# Patient Record
Sex: Female | Born: 1960 | Race: Black or African American | Hispanic: No | Smoking: Former smoker
Health system: Southern US, Community
[De-identification: ages and names within clinical notes are randomized; demographics above are authoritative.]

## PROBLEM LIST (undated history)

## (undated) DIAGNOSIS — F32A Depression, unspecified: Secondary | ICD-10-CM

## (undated) DIAGNOSIS — I639 Cerebral infarction, unspecified: Secondary | ICD-10-CM

## (undated) DIAGNOSIS — R1310 Dysphagia, unspecified: Secondary | ICD-10-CM

## (undated) DIAGNOSIS — L89154 Pressure ulcer of sacral region, stage 4: Secondary | ICD-10-CM

## (undated) DIAGNOSIS — E785 Hyperlipidemia, unspecified: Secondary | ICD-10-CM

## (undated) DIAGNOSIS — F329 Major depressive disorder, single episode, unspecified: Secondary | ICD-10-CM

## (undated) DIAGNOSIS — I69991 Dysphagia following unspecified cerebrovascular disease: Secondary | ICD-10-CM

## (undated) DIAGNOSIS — I10 Essential (primary) hypertension: Secondary | ICD-10-CM

## (undated) DIAGNOSIS — R569 Unspecified convulsions: Secondary | ICD-10-CM

## (undated) HISTORY — DX: Pressure ulcer of sacral region, stage 4: L89.154

## (undated) HISTORY — DX: Cerebral infarction, unspecified: I63.9

## (undated) HISTORY — DX: Dysphagia following unspecified cerebrovascular disease: I69.991

## (undated) HISTORY — DX: Hyperlipidemia, unspecified: E78.5

## (undated) HISTORY — DX: Dysphagia, unspecified: R13.10

## (undated) HISTORY — PX: PEG TUBE PLACEMENT: SUR1034

## (undated) HISTORY — DX: Essential (primary) hypertension: I10

## (undated) HISTORY — PX: COLOSTOMY: SHX63

## (undated) HISTORY — DX: Unspecified convulsions: R56.9

---

## 2016-04-02 HISTORY — PX: ABOVE KNEE LEG AMPUTATION: SUR20

## 2016-04-08 LAB — BASIC METABOLIC PANEL
BUN: 11 mg/dL (ref 4–21)
CREATININE: 0.2 mg/dL — AB (ref 0.5–1.1)
GLUCOSE: 122 mg/dL
POTASSIUM: 4.1 mmol/L (ref 3.4–5.3)
SODIUM: 128 mmol/L — AB (ref 137–147)

## 2016-04-08 LAB — CBC AND DIFFERENTIAL
HCT: 27 % — AB (ref 36–46)
Hemoglobin: 8.9 g/dL — AB (ref 12.0–16.0)
Platelets: 330 10*3/uL (ref 150–399)
WBC: 9.7 10*3/mL

## 2016-04-08 LAB — HEPATIC FUNCTION PANEL
ALT: 15 U/L (ref 7–35)
AST: 14 U/L (ref 13–35)
Alkaline Phosphatase: 65 U/L (ref 25–125)
Bilirubin, Total: 0.3 mg/dL

## 2016-04-08 LAB — POCT INR: INR: 1.3 — AB (ref 0.9–1.1)

## 2016-04-11 ENCOUNTER — Encounter: Payer: Self-pay | Admitting: Adult Health

## 2016-04-11 ENCOUNTER — Non-Acute Institutional Stay (SKILLED_NURSING_FACILITY): Payer: Medicaid Other | Admitting: Adult Health

## 2016-04-11 DIAGNOSIS — Z720 Tobacco use: Secondary | ICD-10-CM | POA: Diagnosis not present

## 2016-04-11 DIAGNOSIS — I69354 Hemiplegia and hemiparesis following cerebral infarction affecting left non-dominant side: Secondary | ICD-10-CM | POA: Insufficient documentation

## 2016-04-11 DIAGNOSIS — L89159 Pressure ulcer of sacral region, unspecified stage: Secondary | ICD-10-CM | POA: Insufficient documentation

## 2016-04-11 DIAGNOSIS — Z933 Colostomy status: Secondary | ICD-10-CM

## 2016-04-11 DIAGNOSIS — I638 Other cerebral infarction: Secondary | ICD-10-CM | POA: Diagnosis not present

## 2016-04-11 DIAGNOSIS — I639 Cerebral infarction, unspecified: Secondary | ICD-10-CM | POA: Insufficient documentation

## 2016-04-11 DIAGNOSIS — I10 Essential (primary) hypertension: Secondary | ICD-10-CM | POA: Diagnosis not present

## 2016-04-11 DIAGNOSIS — F17200 Nicotine dependence, unspecified, uncomplicated: Secondary | ICD-10-CM | POA: Insufficient documentation

## 2016-04-11 DIAGNOSIS — F418 Other specified anxiety disorders: Secondary | ICD-10-CM | POA: Insufficient documentation

## 2016-04-11 DIAGNOSIS — Z89619 Acquired absence of unspecified leg above knee: Secondary | ICD-10-CM | POA: Insufficient documentation

## 2016-04-11 DIAGNOSIS — L89154 Pressure ulcer of sacral region, stage 4: Secondary | ICD-10-CM

## 2016-04-11 DIAGNOSIS — I69391 Dysphagia following cerebral infarction: Secondary | ICD-10-CM

## 2016-04-11 DIAGNOSIS — Z89612 Acquired absence of left leg above knee: Secondary | ICD-10-CM | POA: Diagnosis not present

## 2016-04-11 DIAGNOSIS — R627 Adult failure to thrive: Secondary | ICD-10-CM | POA: Diagnosis not present

## 2016-04-11 DIAGNOSIS — D638 Anemia in other chronic diseases classified elsewhere: Secondary | ICD-10-CM | POA: Diagnosis not present

## 2016-04-11 DIAGNOSIS — I6389 Other cerebral infarction: Secondary | ICD-10-CM

## 2016-04-11 NOTE — Progress Notes (Signed)
Patient ID: Katherine Palmer, female   DOB: 1961/03/05, 55 y.o.   MRN: [id number]    Location:   [facility] Room Number: 127-A Place of Service:  SNF (31)   CODE STATUS: Full Code  No Known Allergies  Chief Complaint  Patient presents with  . 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 She is peg tube dependent for her nutritional support. She is unable to participate in the hpi or ros. At this time there are no nursing concerns.    Past Medical History:  Diagnosis Date  . Decubitus ulcer of sacral region, stage 4 (HCC)   . Dysphagia   . Dysphagia as late effect of cerebrovascular disease   . Hyperlipidemia   . Hypertension   . Seizures (HCC)   . Stroke [facility])    Past Surgical History:  Procedure Laterality Date  . 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 COLOSTOMY    . PEG TUBE PLACEMENT     Social History   Social History  . Marital status: N/A    Spouse name: N/A  . Number of children: N/A  . Years of education: N/A   Occupational History  . Not on file.   Social History Main Topics  . Smoking status: Unknown If Ever Smoked  . Smokeless tobacco: Never Used  . Alcohol use Not on file  . Drug use: Unknown  . Sexual activity: No   Other Topics Concern  . Not on file   Social History Narrative  . No narrative on file     Family History  Problem Relation Age of Onset  . Family history unknown: Yes      VITAL SIGNS BP 118/68   Pulse 93   Temp 97.9 F (36.6 C) (Oral)   Resp 18   Ht 5\' 3"  (1.6 m)   Wt 123 lb 6 oz (56 kg)   SpO2 92%   BMI 21.85 kg/m   Patient's Medications  New Prescriptions   No medications on file  Previous Medications   ACETAMINOPHEN (TYLENOL) 325 MG TABLET    Place 650 mg into feeding tube every 4 (four) hours as needed.   ASPIRIN EC 81 MG TABLET    81 mg daily. Via G-Tube   BISACODYL (DULCOLAX) 10 MG SUPPOSITORY    Place 10 mg rectally daily as needed for moderate constipation.   BUPROPION (WELLBUTRIN) 75 MG TABLET    Place 150 mg into feeding tube 2 (two) times daily.   CLONIDINE (CATAPRES) 0.1 MG TABLET    Take 0.1 mg by mouth every 6 (six) hours as needed.   DIVALPROEX (DEPAKOTE) 125 MG DR TABLET    125 mg 2 (two) times daily.   FA-B6-B12-ARGININE-BLACKPEPPER PO    Give 1 tablet by tube daily.   IPRATROPIUM (ATROVENT) 0.02 % NEBULIZER SOLUTION    Take 2.5 mg by nebulization every 4 (four) hours as needed for wheezing or shortness of breath.   LEVOFLOXACIN (LEVAQUIN) 750 MG TABLET    750 mg daily. Via G-Tube   LINEZOLID (ZYVOX) 600 MG TABLET    Place 600 mg into feeding tube 2 (two) times daily.   LORAZEPAM (ATIVAN) 0.5 MG TABLET    Take 0.5 mg by mouth every 8 (eight) hours as needed  for anxiety.   MAGNESIUM HYDROXIDE (MILK OF MAGNESIA) 400 MG/5ML SUSPENSION    Take 30 mLs by mouth daily as needed for mild constipation.   METRONIDAZOLE (FLAGYL) 500 MG TABLET    Place 500 mg into feeding tube 3 (three) times daily.   NICOTINE (NICODERM CQ - DOSED IN MG/24 HR) 7 MG/24HR PATCH    Place 7 mg onto the skin daily.   ONDANSETRON (ZOFRAN) 4 MG TABLET    Place 4 mg into feeding tube every 6 (six) hours as needed for nausea or vomiting.   OXYCODONE HCL, ABUSE DETER, (OXAYDO) 5 MG TABA    Take 5 mg by mouth every 6 (six) hours as needed.   PANTOPRAZOLE (PROTONIX) 40 MG TABLET    40 mg daily.   POLYETHYLENE GLYCOL (MIRALAX / GLYCOLAX) PACKET    Place 17 g into feeding tube daily.   PRAVASTATIN (PRAVACHOL) 40 MG TABLET    Place 40 mg into feeding tube daily.   PRENATAL CA CARB-B6-B12-FA (FOLBECAL) 1 MG TABS    Give 1 tablet by tube daily.   SODIUM CHLORIDE FLUSH (NORMAL SALINE FLUSH) 0.9 % SOLN    Inject 10 mLs into the vein. Every shift   TRAZODONE (DESYREL) 50 MG TABLET    Place 50 mg into feeding tube at bedtime.    VALPROATE SODIUM (DEPAKENE) 250 MG/5ML SOLN SOLUTION    Take 2.5 mg by mouth 3 (three) times daily.  Modified Medications   No medications on file  Discontinued Medications   No medications on file     SIGNIFICANT DIAGNOSTIC EXAMS  03-28-16: chest x-ray: 1. Improvement in lung aeration since prior study with a decrease in pleural effusions. There is central vascular congestion but no convincing pulmonary edema.    LABS REVIEWED:   04-08-16: wbc 9.7; hgb 8.9; hct 26.5; plt 330; glucose 122; bun 11; creat 0.21; k+ 4.1; na++ 128; liver normal albumin 2.2    Review of Systems  Unable to perform ROS: Other    Physical Exam  Constitutional: No distress.  Frail   Eyes: Conjunctivae are normal.  Unable to make eye contact   Neck: Neck supple. No JVD present. No thyromegaly present.  Cardiovascular: Normal rate, regular rhythm and intact distal pulses.   Respiratory: Effort normal and breath sounds normal. No respiratory distress. She has no wheezes.  GI: Soft. Bowel sounds are normal. She exhibits no distension. There is no tenderness.  Has diverting colostomy   Genitourinary:  Genitourinary Comments: Has foley  Musculoskeletal: She exhibits no edema.  Is status post CVA Is able to move right upper extremity Unable to move left upper extremity: is stiff to passive range of motion Is status post left aka Right lower extremity is contracted   Lymphadenopathy:    She has no cervical adenopathy.  Neurological: She is alert.  Skin: Skin is warm and dry. She is not diaphoretic.  Stage IV sacral wound with wound vac in place 17 x 32 x 2 cm with 2 cm undermining at 2:00    ASSESSMENT/ PLAN:  1. 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   2. 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   3. Stage IV sacral ulceration: has wound vac in place; will be seen by wound  doctor; will continue current treatment; will continue zyvox and flagyl through 04-17-16; will begin prostat 30 cc three times  daily   4. Failure to thrive: her current weight is 123 pounds; will continue tube feeding for her nutritional support will begin prostat 30 cc three time daily albumin is 2.2; will check pre-albumin  5. Depression with anxiety: will continue wellbutrin 150 mg twice daily is taking depakote sprinkles 125 mg twice daily for mood stabilization;  will continue ativan 0.5 mg every 8 hours as needed for anxiety  6.  Hypertension: will continue clonidine 0.1 mg every 6 hours as needed for systolic b/p >170  7. Smoker: is on nicotine patch 7 mg daily for 14 days will then stop and will monitor  8. Cystitis; will complete levaquin 750 mg daily through 04-18-16; will monitor   9. Anemia: hgb 8.9 will monitor her status   10. 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

## 2016-04-16 ENCOUNTER — Non-Acute Institutional Stay (SKILLED_NURSING_FACILITY): Payer: Medicaid Other | Admitting: Internal Medicine

## 2016-04-16 ENCOUNTER — Encounter: Payer: Self-pay | Admitting: Internal Medicine

## 2016-04-16 DIAGNOSIS — I6930 Unspecified sequelae of cerebral infarction: Secondary | ICD-10-CM

## 2016-04-16 DIAGNOSIS — F418 Other specified anxiety disorders: Secondary | ICD-10-CM

## 2016-04-16 DIAGNOSIS — Z960 Presence of urogenital implants: Secondary | ICD-10-CM

## 2016-04-16 DIAGNOSIS — Z9289 Personal history of other medical treatment: Secondary | ICD-10-CM

## 2016-04-16 DIAGNOSIS — E43 Unspecified severe protein-calorie malnutrition: Secondary | ICD-10-CM

## 2016-04-16 DIAGNOSIS — Z933 Colostomy status: Secondary | ICD-10-CM

## 2016-04-16 DIAGNOSIS — I10 Essential (primary) hypertension: Secondary | ICD-10-CM

## 2016-04-16 DIAGNOSIS — Z931 Gastrostomy status: Secondary | ICD-10-CM

## 2016-04-16 DIAGNOSIS — L89154 Pressure ulcer of sacral region, stage 4: Secondary | ICD-10-CM

## 2016-04-16 DIAGNOSIS — D638 Anemia in other chronic diseases classified elsewhere: Secondary | ICD-10-CM

## 2016-04-16 DIAGNOSIS — R627 Adult failure to thrive: Secondary | ICD-10-CM

## 2016-04-16 DIAGNOSIS — Z89612 Acquired absence of left leg above knee: Secondary | ICD-10-CM

## 2016-04-16 DIAGNOSIS — Z978 Presence of other specified devices: Secondary | ICD-10-CM

## 2016-04-16 NOTE — Progress Notes (Signed)
Patient ID: Katherine Palmer, female   DOB: 1961-02-06, 55 y.o.   MRN: [id number]    HISTORY AND PHYSICAL   DATE:  04/16/16  [facility] Room Number: 127- A Place of Service: SNF (31)   Extended Emergency Contact Information Primary Emergency Contact: Brogden,Katawbba Address: [address]          [place], [place] [address] D[place] of [place] Phone: [phone] Relation: Daughter Secondary Emergency Contact: Delane Ginger Address: [address]          [place], [place] [address] Darden [place] of [place] Mobile Phone: [phone] Relation: Daughter  Advanced Directive information Does patient have an advance directive?: No, Would patient like information on creating an advanced directive?: No - patient declined information FULL CODE Chief Complaint  Patient presents with  . 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 She had multiple wounds including sacral wound tx with wound vac. She was recently tx for sepsis due to infected sacral wound. She also recently had left AKA due to LLE ischemia. She presents to SNF for short term rehab.  She c/o severe sacral pain today. She has oxycodone for pain. No nursing issues. No falls. She gets TF and pleasure foods. She is a poor historian due to expressive aphasia. Hx obtained from chart. She receives levaquin IV for cystitis and will complete 8/3rd  Hx CVA - has left hemiparesis.  Takes asa 81 mg daily. She has dysphagia and is Peg tube dependent but does get pleasure foods; no signs of aspiration present.   Stage IV sacral ulcer - wound vac in place; followed by wound doctor and facility wound care. she takes zyvox and flagyl through  04-17-16; she gets  prostat 30 cc three times daily   Failure to thrive -  current weight is 123 pounds; gets TF for nutritional support and  prostat 30 cc three time daily.  albumin is 2.2  Depression with anxiety - mood stable on wellbutrin 150 mg twice daily and  depakote sprinkles 125 mg twice daily; takes ativan 0.5 mg every 8 hours as needed for anxiety  Hypertension: - BP stable on clonidine 0.1 mg every 6 hours as needed for systolic b/p >170  Chronic tobacco abuse - is on nicotine patch 7 mg daily for 14 days and will then be off  Hx Anemia - Hgb 8.9 and stable    Past Medical History:  Diagnosis Date  . Decubitus ulcer of sacral region, stage 4 (HCC)   . Dysphagia   . Dysphagia as late effect of cerebrovascular disease   . Hyperlipidemia   . Hypertension   . Seizures (HCC)   . Stroke [facility])     Past Surgical History:  Procedure Laterality Date  . 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 COLOSTOMY    . PEG TUBE PLACEMENT      Patient Care Team: Kirt Boys, DO as PCP - General (Internal Medicine) Sharee Holster, NP as Nurse Practitioner (Geriatric Medicine) [facility] (Skilled Nursing Facility)  Social History   Social History  . Marital status: Divorced    Spouse name: N/A  . Number of children: N/A  .  Years of education: N/A   Occupational History  . Not on file.   Social History Main Topics  . Smoking status: Former Games developer  . Smokeless tobacco: Never Used  . Alcohol use Not on file  . Drug use: Unknown  . Sexual activity: No   Other Topics Concern  . Not on file   Social History Narrative  . No narrative on file     reports that she has quit smoking. She has never used smokeless tobacco. Her alcohol and drug histories are not on file.  Family History  Problem Relation Age of Onset  . Family history unknown: Yes   No family status information on file.     There is no immunization history on file for this patient.  No  Known Allergies  Medications: Patient's Medications  New Prescriptions   No medications on file  Previous Medications   ACETAMINOPHEN (TYLENOL) 325 MG TABLET    Place 650 mg into feeding tube every 4 (four) hours as needed.   ALBUTEROL (2.5 MG/3ML) 0.083% NEBU 3 ML, ALBUTEROL (5 MG/ML) 0.5% NEBU 0.5 ML    Inhale into the lungs every 4 (four) hours as needed (For wheezing and SOB).   ASPIRIN EC 81 MG TABLET    81 mg daily. Via G-Tube   BISACODYL (DULCOLAX) 10 MG SUPPOSITORY    Place 10 mg rectally daily as needed for moderate constipation.   BUPROPION (WELLBUTRIN) 75 MG TABLET    Place 150 mg into feeding tube 2 (two) times daily.   CLONIDINE (CATAPRES) 0.1 MG TABLET    Take 0.1 mg by mouth every 6 (six) hours as needed.   COLLAGENASE (SANTYL) OINTMENT    Apply 1 application topically daily. Apply to two wounds on left breast every day, apply to right breast wound every day, apply to back wound daily   DIVALPROEX (DEPAKOTE) 125 MG DR TABLET    125 mg 2 (two) times daily.   FA-B6-B12-ARGININE-BLACKPEPPER PO    Give 1 tablet by tube daily.   FEEDING SUPPLEMENT (BOOST HIGH PROTEIN) LIQD    Take 1 Container by mouth 3 (three) times daily between meals.   IPRATROPIUM (ATROVENT) 0.02 % NEBULIZER SOLUTION    Take 2.5 mg by nebulization every 4 (four) hours as needed for wheezing or shortness of breath.   LEVOFLOXACIN (LEVAQUIN) 750 MG TABLET    750 mg daily. Via G-Tube   LINEZOLID (ZYVOX) 600 MG TABLET    Place 600 mg into feeding tube 2 (two) times daily.   LORAZEPAM (ATIVAN) 0.5 MG TABLET    Take 0.5 mg by mouth every 8 (eight) hours as needed for anxiety.   MAGNESIUM HYDROXIDE (MILK OF MAGNESIA) 400 MG/5ML SUSPENSION    Take 30 mLs by mouth daily as needed for mild constipation.   METRONIDAZOLE (FLAGYL) 500 MG TABLET    Place 500 mg into feeding tube 3 (three) times daily.   NICOTINE (NICODERM CQ - DOSED IN MG/24 HR) 7 MG/24HR PATCH    Place 7 mg onto the skin daily.   ONDANSETRON (ZOFRAN) 4 MG  TABLET    Place 4 mg into feeding tube every 6 (six) hours as needed for nausea or vomiting.   OXYCODONE HCL, ABUSE DETER, (OXAYDO) 5 MG TABA    Take 5 mg by mouth every 6 (six) hours as needed.   PANTOPRAZOLE (PROTONIX) 40 MG TABLET    40 mg daily.   POLYETHYLENE GLYCOL (MIRALAX / GLYCOLAX) PACKET    Place 17 g  into feeding tube daily.   PRAVASTATIN (PRAVACHOL) 40 MG TABLET    Place 40 mg into feeding tube daily.   PRENATAL CA CARB-B6-B12-FA (FOLBECAL) 1 MG TABS    Give 1 tablet by tube daily.   SODIUM CHLORIDE FLUSH (NORMAL SALINE FLUSH) 0.9 % SOLN    Inject 10 mLs into the vein. Every shift   TRAZODONE (DESYREL) 50 MG TABLET    Place 50 mg into feeding tube at bedtime.   VALPROATE SODIUM (DEPAKENE) 250 MG/5ML SOLN SOLUTION    Take 2.5 mg by mouth 3 (three) times daily.  Modified Medications   No medications on file  Discontinued Medications   No medications on file    Review of Systems  Unable to perform ROS: Other    Vitals:   04/16/16 1143  BP: 118/68  Pulse: 93  Resp: 18  Temp: 97.9 F (36.6 C)  TempSrc: Oral  SpO2: 92%  Weight: 123 lb 9.6 oz (56.1 kg)  Height: 5\' 3"  (1.6 m)   Body mass index is 21.89 kg/m.  Physical Exam  Constitutional: She appears well-developed.  Frail appearing in NAD, lying in bed  HENT:  Mouth/Throat: Oropharynx is clear and moist. No oropharyngeal exudate.  Eyes: Pupils are equal, round, and reactive to light. No scleral icterus.  Neck: Neck supple. Carotid bruit is not present. No tracheal deviation present. No thyromegaly present.  Cardiovascular: Regular rhythm, normal heart sounds and intact distal pulses.  Tachycardia present.  Exam reveals no gallop and no friction rub.   No murmur heard. Left AKA; no RLE edema or calf TTP. Right arm PICC line intact with no redness or d/c at insertion site  Pulmonary/Chest: Effort normal and breath sounds normal. No stridor. No respiratory distress. She has no wheezes. She has no rales.  Abdominal:  Soft. Bowel sounds are normal. She exhibits no distension and no mass. There is no hepatomegaly. There is no tenderness. There is no rebound and no guarding.    Genitourinary:  Genitourinary Comments: Foley DTG clear yellow urine  Musculoskeletal: She exhibits edema.  Contracted (flexion) LUE at elbow and fingers; left AKA with staples intact and no d/c, swelling or redness.  Lymphadenopathy:    She has no cervical adenopathy.  Neurological: She is alert.  Left hemiparesis and hemiplegia  Skin: Skin is warm and dry. No rash noted.  Sacral decub stage IV per wound care  Psychiatric: She has a normal mood and affect. Her behavior is normal.     Labs reviewed: Nursing Home on 04/11/2016  Component Date Value Ref Range Status  . INR 04/08/2016 1.3* 0.9 - 1.1 Final  . Hemoglobin 04/08/2016 8.9* 12.0 - 16.0 g/dL Final  . HCT 45/40/9811 27* 36 - 46 % Final  . Platelets 04/08/2016 330  150 - 399 K/L Final  . WBC 04/08/2016 9.7  10^3/mL Final  . Glucose 04/08/2016 122  mg/dL Final  . BUN 91/47/8295 11  4 - 21 mg/dL Final  . Creatinine 62/13/0865 0.2* 0.5 - 1.1 mg/dL Final  . Potassium 78/46/9629 4.1  3.4 - 5.3 mmol/L Final  . Sodium 04/08/2016 128* 137 - 147 mmol/L Final  . Alkaline Phosphatase 04/08/2016 65  25 - 125 U/L Final  . ALT 04/08/2016 15  7 - 35 U/L Final  . AST 04/08/2016 14  13 - 35 U/L Final  . Bilirubin, Total 04/08/2016 0.3  mg/dL Final    No results found.   Assessment/Plan   ICD-9-CM ICD-10-CM   1. Stage  IV pressure ulcer of sacral region (HCC) 707.03 L89.154    707.24    2. Failure to thrive syndrome, adult 783.7 R62.7   3. Status post above knee amputation of left lower extremity (HCC) V49.76 Z89.612   4. Depression with anxiety 300.4 F41.8   5. History of CVA with residual deficit 438.9 I69.30   6. Essential hypertension, benign 401.1 I10   7. Anemia of chronic disease 285.29 D63.8   8. Colostomy present (HCC) V44.3 Z93.3   9. PEG (percutaneous  endoscopic gastrostomy) status (HCC) V44.1 Z93.1   10. Foley catheter in place V45.89 Z92.89    due to #1  11. Protein-calorie malnutrition, severe (HCC) 262 E43     Check cbc, cmp, A1c, depakote level, iron studies  Cont nutritional supplements as ordered  Cont TF as ordered  Peg tube/foley cath/colostomy/PICC line care as indicated  Wound care as ordered. Cont wound vac  F/u with specialists as indicated  PT/OT/ST as indicated  GOAL: short term rehab with potential for long term care. Communicated with pt and nursing.  Will follow  Jashawna Reever S. Ancil Linsey  [facility] [address] [place], [place] [address] [phone] Cell (Monday-Friday 8 AM - 5 PM) [phone] After 5 PM and follow prompts

## 2016-04-18 ENCOUNTER — Non-Acute Institutional Stay (SKILLED_NURSING_FACILITY): Payer: Medicaid Other | Admitting: Adult Health

## 2016-04-18 ENCOUNTER — Encounter: Payer: Self-pay | Admitting: Adult Health

## 2016-04-18 DIAGNOSIS — L89154 Pressure ulcer of sacral region, stage 4: Secondary | ICD-10-CM

## 2016-04-18 DIAGNOSIS — D72829 Elevated white blood cell count, unspecified: Secondary | ICD-10-CM | POA: Diagnosis not present

## 2016-04-18 NOTE — Progress Notes (Signed)
Patient ID: Stephanye Finnicum, female   DOB: 1960/11/02, 55 y.o.   MRN: [id number]   Location:   [facility] Room Number: 127-A Place of Service:  SNF (31)   CODE STATUS: Full Code  No Known Allergies  Chief Complaint  Patient presents with  . Acute Visit    HPI:  Her wbc is 15.0 with a left shift. She has a large sacral wound; which is being followed by the wound doctor. There are no reports of fever present. She is unable to participate in the hpi or ros.  I have spoken with Dr. Montez Morita regarding her status and treatment plan.   Past Medical History:  Diagnosis Date  . Decubitus ulcer of sacral region, stage 4 (HCC)   . Dysphagia   . Dysphagia as late effect of cerebrovascular disease   . Hyperlipidemia   . Hypertension   . Seizures (HCC)   . Stroke [facility])     Past Surgical History:  Procedure Laterality Date  . 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 COLOSTOMY    . PEG TUBE PLACEMENT      Social History   Social History  . Marital status: Divorced    Spouse name: N/A  . Number of children: N/A  . Years of education: N/A   Occupational History  . Not on file.   Social History Main Topics  . Smoking status: Former Games developer  . Smokeless tobacco: Never Used  . Alcohol use Not on file  . Drug use: Unknown  . Sexual activity: No   Other Topics Concern  . Not on file   Social History Narrative  . No narrative on file   Family History  Problem Relation Age of Onset  . Family history unknown: Yes      VITAL SIGNS BP 118/68   Pulse 93   Temp 97.9 F (36.6 C) (Oral)   Resp 18   Ht  (1.6 m)   Wt 123 lb (55.8 kg)   SpO2 92%   BMI 21.79 kg/m   Patient's Medications  New Prescriptions   No medications on file  Previous Medications   ACETAMINOPHEN (TYLENOL) 325 MG TABLET    Place 650 mg into feeding tube every 4 (four) hours as needed.   ALBUTEROL (2.5 MG/3ML) 0.083% NEBU 3 ML, ALBUTEROL (5 MG/ML) 0.5% NEBU 0.5 ML    Inhale into the  lungs every 4 (four) hours as needed (For wheezing and SOB).   ASPIRIN EC 81 MG TABLET    81 mg daily. Via G-Tube   BISACODYL (DULCOLAX) 10 MG SUPPOSITORY    Place 10 mg rectally daily as needed for moderate constipation.   BUPROPION (WELLBUTRIN) 75 MG TABLET    Place 150 mg into feeding tube 2 (two) times daily.   CLONIDINE (CATAPRES) 0.1 MG TABLET    Take 0.1 mg by mouth every 6 (six) hours as needed.   COLLAGENASE (SANTYL) OINTMENT    Apply 1 application topically daily. Apply to two wounds on left breast every day, apply to right breast wound every day, apply to back wound daily   DIVALPROEX (DEPAKOTE) 125 MG DR TABLET    125 mg 2 (two) times daily.   FA-B6-B12-ARGININE-BLACKPEPPER PO    Give 1 tablet by tube daily.   FEEDING SUPPLEMENT (BOOST HIGH PROTEIN) LIQD    Take 1 Container by mouth 3 (three) times daily between meals.   IPRATROPIUM (ATROVENT) 0.02 % NEBULIZER SOLUTION    Take 2.5  mg by nebulization every 4 (four) hours as needed for wheezing or shortness of breath.   LORAZEPAM (ATIVAN) 0.5 MG TABLET    Take 0.5 mg by mouth every 8 (eight) hours as needed for anxiety.   MAGNESIUM HYDROXIDE (MILK OF MAGNESIA) 400 MG/5ML SUSPENSION    Take 30 mLs by mouth daily as needed for mild constipation.   NICOTINE (NICODERM CQ - DOSED IN MG/24 HR) 7 MG/24HR PATCH    Place 7 mg onto the skin daily.   ONDANSETRON (ZOFRAN) 4 MG TABLET    Place 4 mg into feeding tube every 6 (six) hours as needed for nausea or vomiting.   OXYCODONE HCL, ABUSE DETER, (OXAYDO) 5 MG TABA    Take 5 mg by mouth every 6 (six) hours as needed.   PANTOPRAZOLE (PROTONIX) 40 MG TABLET    40 mg daily.   POLYETHYLENE GLYCOL (MIRALAX / GLYCOLAX) PACKET    Place 17 g into feeding tube daily.   PRAVASTATIN (PRAVACHOL) 40 MG TABLET    Place 40 mg into feeding tube daily.   PRENATAL CA CARB-B6-B12-FA (FOLBECAL) 1 MG TABS    Give 1 tablet by tube daily.   SODIUM CHLORIDE FLUSH (NORMAL SALINE FLUSH) 0.9 % SOLN    Inject 10 mLs into the  vein. Every shift   TRAZODONE (DESYREL) 50 MG TABLET    Place 50 mg into feeding tube at bedtime.   VALPROATE SODIUM (DEPAKENE) 250 MG/5ML SOLN SOLUTION    Take 2.5 mg by mouth 3 (three) times daily.  Modified Medications   No medications on file  Discontinued Medications   No medications on file      SIGNIFICANT DIAGNOSTIC EXAMS  03-28-16: chest x-ray: 1. Improvement in lung aeration since prior study with a decrease in pleural effusions. There is central vascular congestion but no convincing pulmonary edema.    LABS REVIEWED:   04-08-16: wbc 9.7; hgb 8.9; hct 26.5; plt 330; glucose 122; bun 11; creat 0.21; k+ 4.1; na++ 128; liver normal albumin 2.2  04-12-16: wbc 15.0; hgb 8.8; hct 26.6; mcv 94.0; plt 386; glucose 125; bun 11; creat 0.21; k+ 4.0; na++ 132; liver normal albumin 2.0; chol 77; ldl 35; trig 81; hdl 26; iron 10; tibc 23; ferritin 471; depakote 15.4; hgb a1c 5.7; abs neut: 8400; abs mono 3750; abs baso 0      Review of Systems  Unable to perform ROS: Other    Physical Exam  Constitutional: No distress.  Frail   Eyes: Conjunctivae are normal.  Unable to make eye contact   Neck: Neck supple. No JVD present. No thyromegaly present.  Cardiovascular: Normal rate, regular rhythm and intact distal pulses.   Respiratory: Effort normal and breath sounds normal. No respiratory distress. She has no wheezes.  GI: Soft. Bowel sounds are normal. She exhibits no distension. There is no tenderness.  Has diverting colostomy   Genitourinary:  Genitourinary Comments: Has foley  Musculoskeletal: She exhibits no edema.  Is status post CVA Is able to move right upper extremity Unable to move left upper extremity: is stiff to passive range of motion Is status post left aka Right lower extremity is contracted   Lymphadenopathy:    She has no cervical adenopathy.  Neurological: She is alert.  Skin: Skin is warm and dry. She is not diaphoretic. picc line in place   Stage IV sacral  wound with wound vac in place 36 x 15 x 1.5 cm with 2 cm undermining at 2:00  ASSESSMENT/ PLAN:  1. Stage IV sacral ulceration: has wound vac in place; will be seen by wound doctor; will continue current treatment; will continue zyvox and flagyl through 04-17-16; will begin prostat 30 cc three times daily   2. Leukocytosis: will remove and replace the PICC line and will culture tip. Will setup a 2-d echo to look for endocarditis. Will begin Vancomycin IV for 14 days with pharmacy to dose. Will setup I/D consult    Time spent with patient 45   minutes >50% time spent counseling; reviewing medical record; tests; labs; and developing future plan of care   MD is aware of resident's narcotic use and is in agreement with current plan of care. We will attempt to wean resident as apropriate   Synthia Innocent NP [facility]  Contact [phone] Monday through Friday 8am- 5pm  After hours call [phone]

## 2016-04-25 ENCOUNTER — Encounter: Payer: Self-pay | Admitting: Adult Health

## 2016-04-25 ENCOUNTER — Non-Acute Institutional Stay (SKILLED_NURSING_FACILITY): Payer: Medicaid Other | Admitting: Adult Health

## 2016-04-25 DIAGNOSIS — A419 Sepsis, unspecified organism: Secondary | ICD-10-CM | POA: Diagnosis not present

## 2016-04-25 DIAGNOSIS — L89154 Pressure ulcer of sacral region, stage 4: Secondary | ICD-10-CM

## 2016-04-25 DIAGNOSIS — D72829 Elevated white blood cell count, unspecified: Secondary | ICD-10-CM

## 2016-04-25 NOTE — Progress Notes (Signed)
Patient ID: Katherine Palmer, female   DOB: [birth year], 55 y.o.   MRN: [id number]   Location:   [facility] Room Number: 127-A Place of Service:  SNF (31)   CODE STATUS: Full Code  No Known Allergies  Chief Complaint  Patient presents with  . Acute Visit    Follow up Echo study    HPI:  She is on zyvox; levaquin and flagyl. Her current weight is 109 pounds with her weight in July of 123 pounds. I am not certain if this weight is accurate and will need daily weights for a short period of time to ensure that this weight is accurate. Her tube feeding was adjusted on 04-24-16. Her 2-d echo recommends for her to have a TEE. She is unable to participate in the hpi.    Past Medical History:  Diagnosis Date  . Decubitus ulcer of sacral region, stage 4 (HCC)   . Dysphagia   . Dysphagia as late effect of cerebrovascular disease   . Hyperlipidemia   . Hypertension   . Seizures (HCC)   . Stroke [facility])     Past Surgical History:  Procedure Laterality Date  . 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 COLOSTOMY    . PEG TUBE PLACEMENT      Social History   Social History  . Marital status: Divorced    Spouse name: N/A  . Number of children: N/A  . Years of education: N/A   Occupational History  . Not on file.   Social History Main Topics  . Smoking status: Former Games developer  . Smokeless tobacco: Never Used  . Alcohol use Not on file  . Drug use: Unknown  . Sexual activity: No   Other Topics Concern  . Not on file   Social History Narrative  . No narrative on file   Family History  Problem Relation Age of Onset  . Family history unknown: Yes      VITAL SIGNS BP 128/77   Pulse 88   Temp 98.8 F (37.1 C) (Oral)   Resp 16   Ht 5\' 3"  (1.6 m)   Wt 109 lb (49.4 kg)   SpO2 95%   BMI 19.31 kg/m   Patient's Medications  New Prescriptions   No medications on file  Previous Medications   ACETAMINOPHEN (TYLENOL) 325 MG TABLET    Place 650 mg into feeding  tube every 4 (four) hours as needed.   ALBUTEROL (2.5 MG/3ML) 0.083% NEBU 3 ML, ALBUTEROL (5 MG/ML) 0.5% NEBU 0.5 ML    Inhale into the lungs every 4 (four) hours as needed (For wheezing and SOB).   ASPIRIN EC 81 MG TABLET    81 mg daily. Via G-Tube   BISACODYL (DULCOLAX) 10 MG SUPPOSITORY    Place 10 mg rectally daily as needed for moderate constipation.   BUPROPION (WELLBUTRIN) 75 MG TABLET    Place 150 mg into feeding tube 2 (two) times daily.   CLONIDINE (CATAPRES) 0.1 MG TABLET    Take 0.1 mg by mouth every 6 (six) hours as needed.   COLLAGENASE (SANTYL) OINTMENT    Apply 1 application topically daily. Apply to two wounds on left breast every day, apply to right breast wound every day, apply to back wound daily   DIVALPROEX (DEPAKOTE) 125 MG DR TABLET    125 mg 2 (two) times daily.   FA-B6-B12-ARGININE-BLACKPEPPER PO    Give 1 tablet by tube daily.   FEEDING SUPPLEMENT (BOOST HIGH  PROTEIN) LIQD    Take 1 Container by mouth 3 (three) times daily between meals.   IPRATROPIUM (ATROVENT) 0.02 % NEBULIZER SOLUTION    Take 2.5 mg by nebulization every 4 (four) hours as needed for wheezing or shortness of breath.   LEVOFLOXACIN (LEVAQUIN) 750 MG TABLET    750 mg daily. Via G-Tube   LINEZOLID (ZYVOX) 600 MG TABLET    Place 600 mg into feeding tube 2 (two) times daily.   LORAZEPAM (ATIVAN) 0.5 MG TABLET    Take 0.5 mg by mouth every 8 (eight) hours as needed for anxiety.   MAGNESIUM HYDROXIDE (MILK OF MAGNESIA) 400 MG/5ML SUSPENSION    Take 30 mLs by mouth daily as needed for mild constipation.   METRONIDAZOLE (FLAGYL) 500 MG TABLET    Place 500 mg into feeding tube 3 (three) times daily.   NICOTINE (NICODERM CQ - DOSED IN MG/24 HR) 7 MG/24HR PATCH    Place 7 mg onto the skin daily.   ONDANSETRON (ZOFRAN) 4 MG TABLET    Place 4 mg into feeding tube every 6 (six) hours as needed for nausea or vomiting.   OXYCODONE HCL, ABUSE DETER, (OXAYDO) 5 MG TABA    Take 5 mg by mouth every 6 (six) hours as needed.     PANTOPRAZOLE (PROTONIX) 40 MG TABLET    40 mg daily.   POLYETHYLENE GLYCOL (MIRALAX / GLYCOLAX) PACKET    Place 17 g into feeding tube daily.   PRAVASTATIN (PRAVACHOL) 40 MG TABLET    Place 40 mg into feeding tube daily.   PRENATAL CA CARB-B6-B12-FA (FOLBECAL) 1 MG TABS    Give 1 tablet by tube daily.   SODIUM CHLORIDE FLUSH (NORMAL SALINE FLUSH) 0.9 % SOLN    Inject 10 mLs into the vein. Every shift   VALPROATE SODIUM (DEPAKENE) 250 MG/5ML SOLN SOLUTION    Take 2.5 mg by mouth 3 (three) times daily.  Modified Medications   No medications on file  Discontinued Medications   TRAZODONE (DESYREL) 50 MG TABLET    Place 50 mg into feeding tube at bedtime.     SIGNIFICANT DIAGNOSTIC EXAMS  03-28-16: chest x-ray: 1. Improvement in lung aeration since prior study with a decrease in pleural effusions. There is central vascular congestion but no convincing pulmonary edema.   04-23-16: 2-d echo: 1. technically difficult study with limited views 2. Normal appearing left ventricular systolic function. EF 55-60% 3. Mild sclerotic aortic valve no significant stenosis by velocity 4. Mild concentric left ventricular hypertrophy 5. Moderate mitral regurgitation 6. Mild tricuspid regurgitation 7. Highly mobile interatrial septum with a suggestion of a septal defect. However, the septum and the valvular structures are not well visualized to determine if there is a defect or evidence of vegetations. 8. Recommend transesophageal echocardiogram for better visualization of te left atrial septum in addition to better visualize the valvular structures given the patient's history   LABS REVIEWED:   04-08-16: wbc 9.7; hgb 8.9; hct 26.5; plt 330; glucose 122; bun 11; creat 0.21; k+ 4.1; na++ 128; liver normal albumin 2.2  04-12-16: wbc 15.0; hgb 8.8; hct 26.6; mcv 94.0; plt 386; glucose 125; bun 11; creat 0.21; k+ 4.0; na++ 132; liver normal albumin 2.0; chol 77; ldl 35; trig 81; hdl 26; iron 10; tibc 23; ferritin  471; depakote 15.4; hgb a1c 5.7; abs neut: 8400; abs mono 3750; abs baso 0      Review of Systems  Unable to perform ROS: Other    Physical  Exam  Constitutional: No distress.  Frail   Eyes: Conjunctivae are normal.  Unable to make eye contact   Neck: Neck supple. No JVD present. No thyromegaly present.  Cardiovascular: Normal rate, regular rhythm and intact distal pulses.   Respiratory: Effort normal and breath sounds normal. No respiratory distress. She has no wheezes.  GI: Soft. Bowel sounds are normal. She exhibits no distension. There is no tenderness.  Has diverting colostomy   Genitourinary:  Genitourinary Comments: Has foley  Musculoskeletal: She exhibits no edema.  Is status post CVA Is able to move right upper extremity Unable to move left upper extremity: is stiff to passive range of motion Is status post left aka Right lower extremity is contracted   Lymphadenopathy:    She has no cervical adenopathy.  Neurological: She is alert.  Skin: Skin is warm and dry. She is not diaphoretic. picc line in place   Stage IV sacral wound with wound vac in place 36 x 15 x 1.5 cm with 2 cm undermining at 2:00      ASSESSMENT/ PLAN:  1. Stage IV sacral ulceration: has wound vac in place; will be seen by wound doctor; will continue current treatment; will continue zyvox and flagyl through 04-17-16; will begin prostat 30 cc three times daily   2. Leukocytosis/sepsis: will continue her current abt; will setup a TEE; her I/D consult is pending.  I have spoken with Dr. Montez Morita regarding her status.    Time spent with patient  45  minutes >50% time spent counseling; reviewing medical record; tests; labs; and developing future plan of care  MD is aware of resident's narcotic use and is in agreement with current plan of care. We will attempt to wean resident as apropriate   Synthia Innocent NP [facility]  Contact [phone] Monday through Friday 8am- 5pm  After hours  call [phone]

## 2016-05-07 ENCOUNTER — Encounter: Payer: Self-pay | Admitting: Cardiology

## 2016-05-08 ENCOUNTER — Ambulatory Visit: Payer: Self-pay | Admitting: Cardiology

## 2016-05-08 NOTE — Progress Notes (Deleted)
Cardiology Office Note    Date:  05/08/2016   ID:  Katherine AhmadiDiane Lawes, DOB 08-07-1961, MRN [id number]  PCP:  Kirt Boysarter, Monica, DO  Cardiologist:  Peter SwazilandJordan, MD    History of Present Illness:  Katherine Palmer is a 55 y.o. female ***    Past Medical History:  Diagnosis Date  . Decubitus ulcer of sacral region, stage 4 (HCC)   . Dysphagia   . Dysphagia as late effect of cerebrovascular disease   . Hyperlipidemia   . Hypertension   . Seizures (HCC)   . Stroke [facility](HCC)     Past Surgical History:  Procedure Laterality Date  . 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 COLOSTOMY    . PEG TUBE PLACEMENT      Current Medications: Outpatient Medications Prior to Visit  Medication Sig Dispense Refill  . acetaminophen (TYLENOL) 325 MG tablet Place 650 mg into feeding tube every 4 (four) hours as needed.    Marland Kitchen. albuterol (2.5 MG/3ML) 0.083% NEBU 3 mL, albuterol (5 MG/ML) 0.5% NEBU 0.5 mL Inhale into the lungs every 4 (four) hours as needed (For wheezing and SOB).    Marland Kitchen. aspirin EC 81 MG tablet 81 mg daily. Via G-Tube    . bisacodyl (DULCOLAX) 10 MG suppository Place 10 mg rectally daily as needed for moderate constipation.    Marland Kitchen. buPROPion (WELLBUTRIN) 75 MG tablet Place 150 mg into feeding tube 2 (two) times daily.    . cloNIDine (CATAPRES) 0.1 MG tablet Take 0.1 mg by mouth every 6 (six) hours as needed.    . collagenase (SANTYL) ointment Apply 1 application topically daily. Apply to two wounds on left breast every day, apply to right breast wound every day, apply to back wound daily    . divalproex (DEPAKOTE) 125 MG DR tablet 125 mg 2 (two) times daily.    Marland Kitchen. FA-B6-B12-ARGININE-BLACKPEPPER PO Give 1 tablet by tube daily.    . feeding supplement (BOOST HIGH PROTEIN) LIQD Take 1 Container by mouth 3 (three) times daily between meals.    Marland Kitchen. ipratropium (ATROVENT) 0.02 % nebulizer solution Take 2.5 mg by nebulization every 4 (four) hours as needed for wheezing or shortness of breath.    .  levofloxacin (LEVAQUIN) 750 MG tablet 750 mg daily. Via G-Tube    . linezolid (ZYVOX) 600 MG tablet Place 600 mg into feeding tube 2 (two) times daily.    Marland Kitchen. LORazepam (ATIVAN) 0.5 MG tablet Take 0.5 mg by mouth every 8 (eight) hours as needed for anxiety.    . magnesium hydroxide (MILK OF MAGNESIA) 400 MG/5ML suspension Take 30 mLs by mouth daily as needed for mild constipation.    . metroNIDAZOLE (FLAGYL) 500 MG tablet Place 500 mg into feeding tube 3 (three) times daily.    . nicotine (NICODERM CQ - DOSED IN MG/24 HR) 7 mg/24hr patch Place 7 mg onto the skin daily.    . ondansetron (ZOFRAN) 4 MG tablet Place 4 mg into feeding tube every 6 (six) hours as needed for nausea or vomiting.    . OxyCODONE HCl, Abuse Deter, (OXAYDO) 5 MG TABA Take 5 mg by mouth every 6 (six) hours as needed.    . pantoprazole (PROTONIX) 40 MG tablet 40 mg daily.    . polyethylene glycol (MIRALAX / GLYCOLAX) packet Place 17 g into feeding tube daily.    . pravastatin (PRAVACHOL) 40 MG tablet Place 40 mg into feeding tube daily.    . Prenatal Ca Carb-B6-B12-FA (FOLBECAL) 1 MG  TABS Give 1 tablet by tube daily.    . Sodium Chloride Flush (NORMAL SALINE FLUSH) 0.9 % SOLN Inject 10 mLs into the vein. Every shift    . Valproate Sodium (DEPAKENE) 250 MG/5ML SOLN solution Take 2.5 mg by mouth 3 (three) times daily.     No facility-administered medications prior to visit.      Allergies:   Review of patient's allergies indicates no known allergies.   Social History   Social History  . Marital status: Divorced    Spouse name: N/A  . Number of children: N/A  . Years of education: N/A   Social History Main Topics  . Smoking status: Former Games developermoker  . Smokeless tobacco: Never Used  . Alcohol use Not on file  . Drug use: Unknown  . Sexual activity: No   Other Topics Concern  . Not on file   Social History Narrative  . No narrative on file     Family History:  The patient's ***Family history is unknown by patient.     ROS:   Please see the history of present illness.    ROS All other systems reviewed and are negative.   PHYSICAL EXAM:   VS:  There were no vitals taken for this visit.   GEN: Well nourished, well developed, in no acute distress HEENT: normal Neck: no JVD, carotid bruits, or masses Cardiac: ***RRR; no murmurs, rubs, or gallops,no edema  Respiratory:  clear to auscultation bilaterally, normal work of breathing GI: soft, nontender, nondistended, + BS MS: no deformity or atrophy Skin: warm and dry, no rash Neuro:  Alert and Oriented x 3, Strength and sensation are intact Psych: euthymic mood, full affect  Wt Readings from Last 3 Encounters:  04/25/16 109 lb (49.4 kg)  04/18/16 123 lb (55.8 kg)  04/16/16 123 lb 9.6 oz (56.1 kg)      Studies/Labs Reviewed:   EKG:  EKG is*** ordered today.  The ekg ordered today demonstrates ***  Recent Labs: 04/08/2016: ALT 15; BUN 11; Creatinine 0.2; Hemoglobin 8.9; Platelets 330; Potassium 4.1; Sodium 128   Lipid Panel No results found for: CHOL, TRIG, HDL, CHOLHDL, VLDL, LDLCALC, LDLDIRECT  Additional studies/ records that were reviewed today include:  ***    ASSESSMENT:    No diagnosis found.   PLAN:  In order of problems listed above:  1. ***    Medication Adjustments/Labs and Tests Ordered: Current medicines are reviewed at length with the patient today.  Concerns regarding medicines are outlined above.  Medication changes, Labs and Tests ordered today are listed in the Patient Instructions below. There are no Patient Instructions on file for this visit.   Signed, Peter SwazilandJordan, MD  05/08/2016 7:29 AM    [facility] [address], [place], [place], [id number] [phone]

## 2016-05-09 ENCOUNTER — Encounter: Payer: Self-pay | Admitting: *Deleted

## 2016-05-09 NOTE — Progress Notes (Signed)
Letter mailed

## 2016-05-13 DIAGNOSIS — D72829 Elevated white blood cell count, unspecified: Secondary | ICD-10-CM | POA: Insufficient documentation

## 2016-05-14 DIAGNOSIS — A419 Sepsis, unspecified organism: Secondary | ICD-10-CM | POA: Insufficient documentation

## 2016-05-16 ENCOUNTER — Encounter: Payer: Self-pay | Admitting: Adult Health

## 2016-05-16 ENCOUNTER — Emergency Department (HOSPITAL_COMMUNITY): Payer: Medicaid Other

## 2016-05-16 ENCOUNTER — Inpatient Hospital Stay (HOSPITAL_COMMUNITY): Payer: Medicaid Other

## 2016-05-16 ENCOUNTER — Non-Acute Institutional Stay (SKILLED_NURSING_FACILITY): Payer: Medicaid Other | Admitting: Adult Health

## 2016-05-16 ENCOUNTER — Encounter (HOSPITAL_COMMUNITY): Payer: Self-pay | Admitting: *Deleted

## 2016-05-16 ENCOUNTER — Inpatient Hospital Stay (HOSPITAL_COMMUNITY)
Admission: EM | Admit: 2016-05-16 | Discharge: 2016-05-21 | DRG: 871 | Disposition: A | Discharge status: Skilled Nursing Facility | Payer: Medicaid Other | Attending: Internal Medicine | Admitting: Internal Medicine

## 2016-05-16 DIAGNOSIS — A4181 Sepsis due to Enterococcus: Principal | ICD-10-CM | POA: Diagnosis present

## 2016-05-16 DIAGNOSIS — R0600 Dyspnea, unspecified: Secondary | ICD-10-CM | POA: Diagnosis not present

## 2016-05-16 DIAGNOSIS — Z89619 Acquired absence of unspecified leg above knee: Secondary | ICD-10-CM

## 2016-05-16 DIAGNOSIS — Z7189 Other specified counseling: Secondary | ICD-10-CM | POA: Diagnosis not present

## 2016-05-16 DIAGNOSIS — L89154 Pressure ulcer of sacral region, stage 4: Secondary | ICD-10-CM | POA: Diagnosis present

## 2016-05-16 DIAGNOSIS — L8994 Pressure ulcer of unspecified site, stage 4: Secondary | ICD-10-CM | POA: Diagnosis not present

## 2016-05-16 DIAGNOSIS — Z452 Encounter for adjustment and management of vascular access device: Secondary | ICD-10-CM

## 2016-05-16 DIAGNOSIS — I69391 Dysphagia following cerebral infarction: Secondary | ICD-10-CM

## 2016-05-16 DIAGNOSIS — I69354 Hemiplegia and hemiparesis following cerebral infarction affecting left non-dominant side: Secondary | ICD-10-CM

## 2016-05-16 DIAGNOSIS — I639 Cerebral infarction, unspecified: Secondary | ICD-10-CM | POA: Insufficient documentation

## 2016-05-16 DIAGNOSIS — E870 Hyperosmolality and hypernatremia: Secondary | ICD-10-CM | POA: Diagnosis present

## 2016-05-16 DIAGNOSIS — R1310 Dysphagia, unspecified: Secondary | ICD-10-CM | POA: Diagnosis present

## 2016-05-16 DIAGNOSIS — R0603 Acute respiratory distress: Secondary | ICD-10-CM

## 2016-05-16 DIAGNOSIS — D649 Anemia, unspecified: Secondary | ICD-10-CM | POA: Diagnosis present

## 2016-05-16 DIAGNOSIS — Z960 Presence of urogenital implants: Secondary | ICD-10-CM

## 2016-05-16 DIAGNOSIS — R54 Age-related physical debility: Secondary | ICD-10-CM | POA: Diagnosis present

## 2016-05-16 DIAGNOSIS — Z87891 Personal history of nicotine dependence: Secondary | ICD-10-CM | POA: Diagnosis not present

## 2016-05-16 DIAGNOSIS — Z931 Gastrostomy status: Secondary | ICD-10-CM

## 2016-05-16 DIAGNOSIS — Z7982 Long term (current) use of aspirin: Secondary | ICD-10-CM | POA: Diagnosis not present

## 2016-05-16 DIAGNOSIS — R4182 Altered mental status, unspecified: Secondary | ICD-10-CM | POA: Diagnosis present

## 2016-05-16 DIAGNOSIS — J9600 Acute respiratory failure, unspecified whether with hypoxia or hypercapnia: Secondary | ICD-10-CM

## 2016-05-16 DIAGNOSIS — L8990 Pressure ulcer of unspecified site, unspecified stage: Secondary | ICD-10-CM | POA: Insufficient documentation

## 2016-05-16 DIAGNOSIS — R571 Hypovolemic shock: Secondary | ICD-10-CM | POA: Insufficient documentation

## 2016-05-16 DIAGNOSIS — R6521 Severe sepsis with septic shock: Secondary | ICD-10-CM | POA: Diagnosis present

## 2016-05-16 DIAGNOSIS — A419 Sepsis, unspecified organism: Secondary | ICD-10-CM

## 2016-05-16 DIAGNOSIS — E872 Acidosis, unspecified: Secondary | ICD-10-CM | POA: Diagnosis present

## 2016-05-16 DIAGNOSIS — Z933 Colostomy status: Secondary | ICD-10-CM

## 2016-05-16 DIAGNOSIS — B952 Enterococcus as the cause of diseases classified elsewhere: Secondary | ICD-10-CM | POA: Diagnosis present

## 2016-05-16 DIAGNOSIS — Z95828 Presence of other vascular implants and grafts: Secondary | ICD-10-CM | POA: Diagnosis not present

## 2016-05-16 DIAGNOSIS — N390 Urinary tract infection, site not specified: Secondary | ICD-10-CM | POA: Diagnosis present

## 2016-05-16 DIAGNOSIS — Z681 Body mass index (BMI) 19 or less, adult: Secondary | ICD-10-CM

## 2016-05-16 DIAGNOSIS — E785 Hyperlipidemia, unspecified: Secondary | ICD-10-CM | POA: Diagnosis present

## 2016-05-16 DIAGNOSIS — R400 Somnolence: Secondary | ICD-10-CM | POA: Diagnosis not present

## 2016-05-16 DIAGNOSIS — Z993 Dependence on wheelchair: Secondary | ICD-10-CM

## 2016-05-16 DIAGNOSIS — R7881 Bacteremia: Secondary | ICD-10-CM | POA: Diagnosis not present

## 2016-05-16 DIAGNOSIS — R6520 Severe sepsis without septic shock: Secondary | ICD-10-CM

## 2016-05-16 DIAGNOSIS — G9340 Encephalopathy, unspecified: Secondary | ICD-10-CM | POA: Diagnosis present

## 2016-05-16 DIAGNOSIS — R569 Unspecified convulsions: Secondary | ICD-10-CM | POA: Diagnosis present

## 2016-05-16 DIAGNOSIS — E861 Hypovolemia: Secondary | ICD-10-CM | POA: Diagnosis present

## 2016-05-16 DIAGNOSIS — Z89612 Acquired absence of left leg above knee: Secondary | ICD-10-CM | POA: Diagnosis not present

## 2016-05-16 DIAGNOSIS — R29810 Facial weakness: Secondary | ICD-10-CM | POA: Diagnosis present

## 2016-05-16 DIAGNOSIS — I10 Essential (primary) hypertension: Secondary | ICD-10-CM | POA: Diagnosis present

## 2016-05-16 DIAGNOSIS — E43 Unspecified severe protein-calorie malnutrition: Secondary | ICD-10-CM | POA: Diagnosis present

## 2016-05-16 DIAGNOSIS — M2450 Contracture, unspecified joint: Secondary | ICD-10-CM | POA: Diagnosis present

## 2016-05-16 DIAGNOSIS — J9601 Acute respiratory failure with hypoxia: Secondary | ICD-10-CM | POA: Diagnosis not present

## 2016-05-16 DIAGNOSIS — L89892 Pressure ulcer of other site, stage 2: Secondary | ICD-10-CM | POA: Diagnosis present

## 2016-05-16 DIAGNOSIS — Z515 Encounter for palliative care: Secondary | ICD-10-CM

## 2016-05-16 DIAGNOSIS — E876 Hypokalemia: Secondary | ICD-10-CM | POA: Diagnosis present

## 2016-05-16 DIAGNOSIS — Z936 Other artificial openings of urinary tract status: Secondary | ICD-10-CM | POA: Diagnosis not present

## 2016-05-16 DIAGNOSIS — Z789 Other specified health status: Secondary | ICD-10-CM | POA: Diagnosis not present

## 2016-05-16 LAB — CBC WITH DIFFERENTIAL/PLATELET
BASOS ABS: 0 10*3/uL (ref 0.0–0.1)
BASOS PCT: 0 %
Eosinophils Absolute: 0 10*3/uL (ref 0.0–0.7)
Eosinophils Relative: 0 %
HEMATOCRIT: 37.8 % (ref 36.0–46.0)
HEMOGLOBIN: 10.7 g/dL — AB (ref 12.0–15.0)
Lymphocytes Relative: 14 %
Lymphs Abs: 2.2 10*3/uL (ref 0.7–4.0)
MCH: 28.2 pg (ref 26.0–34.0)
MCHC: 28.3 g/dL — ABNORMAL LOW (ref 30.0–36.0)
MCV: 99.7 fL (ref 78.0–100.0)
MONO ABS: 1.8 10*3/uL — AB (ref 0.1–1.0)
Monocytes Relative: 11 %
NEUTROS ABS: 12.1 10*3/uL — AB (ref 1.7–7.7)
NEUTROS PCT: 75 %
Platelets: 478 10*3/uL — ABNORMAL HIGH (ref 150–400)
RBC: 3.79 MIL/uL — ABNORMAL LOW (ref 3.87–5.11)
RDW: 19.2 % — AB (ref 11.5–15.5)
WBC: 16.1 10*3/uL — ABNORMAL HIGH (ref 4.0–10.5)

## 2016-05-16 LAB — POCT I-STAT 3, ART BLOOD GAS (G3+)
ACID-BASE DEFICIT: 6 mmol/L — AB (ref 0.0–2.0)
BICARBONATE: 18.4 mmol/L — AB (ref 20.0–28.0)
O2 Saturation: 96 %
TCO2: 19 mmol/L (ref 0–100)
pCO2 arterial: 30.8 mmHg — ABNORMAL LOW (ref 32.0–48.0)
pH, Arterial: 7.385 (ref 7.350–7.450)
pO2, Arterial: 84 mmHg (ref 83.0–108.0)

## 2016-05-16 LAB — LACTIC ACID, PLASMA
LACTIC ACID, VENOUS: 1 mmol/L (ref 0.5–1.9)
LACTIC ACID, VENOUS: 1.1 mmol/L (ref 0.5–1.9)

## 2016-05-16 LAB — PROCALCITONIN: Procalcitonin: 5.22 ng/mL

## 2016-05-16 LAB — C DIFFICILE QUICK SCREEN W PCR REFLEX
C DIFFICILE (CDIFF) INTERP: NOT DETECTED
C DIFFICLE (CDIFF) ANTIGEN: NEGATIVE
C Diff toxin: NEGATIVE

## 2016-05-16 LAB — I-STAT CG4 LACTIC ACID, ED: LACTIC ACID, VENOUS: 4.88 mmol/L — AB (ref 0.5–1.9)

## 2016-05-16 LAB — URINALYSIS, ROUTINE W REFLEX MICROSCOPIC
BILIRUBIN URINE: NEGATIVE
Glucose, UA: NEGATIVE mg/dL
Hgb urine dipstick: NEGATIVE
KETONES UR: NEGATIVE mg/dL
NITRITE: NEGATIVE
PROTEIN: NEGATIVE mg/dL
SPECIFIC GRAVITY, URINE: 1.034 — AB (ref 1.005–1.030)
pH: 5 (ref 5.0–8.0)

## 2016-05-16 LAB — URINE MICROSCOPIC-ADD ON
BACTERIA UA: NONE SEEN
RBC / HPF: NONE SEEN RBC/hpf (ref 0–5)

## 2016-05-16 LAB — VALPROIC ACID LEVEL: VALPROIC ACID LVL: 16 ug/mL — AB (ref 50.0–100.0)

## 2016-05-16 LAB — COMPREHENSIVE METABOLIC PANEL
ALBUMIN: 1.9 g/dL — AB (ref 3.5–5.0)
ALT: 31 U/L (ref 14–54)
ANION GAP: 11 (ref 5–15)
AST: 46 U/L — ABNORMAL HIGH (ref 15–41)
Alkaline Phosphatase: 80 U/L (ref 38–126)
BUN: 58 mg/dL — ABNORMAL HIGH (ref 6–20)
CHLORIDE: 121 mmol/L — AB (ref 101–111)
CO2: 19 mmol/L — AB (ref 22–32)
Calcium: 8.6 mg/dL — ABNORMAL LOW (ref 8.9–10.3)
Creatinine, Ser: 0.58 mg/dL (ref 0.44–1.00)
GFR calc Af Amer: >60 mL/min (ref >60)
GFR calc non Af Amer: >60 mL/min (ref >60)
GLUCOSE: 128 mg/dL — AB (ref 65–99)
POTASSIUM: 4.8 mmol/L (ref 3.5–5.1)
SODIUM: 151 mmol/L — AB (ref 135–145)
Total Bilirubin: 0.7 mg/dL (ref 0.3–1.2)
Total Protein: 8.2 g/dL — ABNORMAL HIGH (ref 6.5–8.1)

## 2016-05-16 LAB — GLUCOSE, CAPILLARY
Glucose-Capillary: 105 mg/dL — ABNORMAL HIGH (ref 65–99)
Glucose-Capillary: 76 mg/dL (ref 65–99)

## 2016-05-16 LAB — I-STAT ARTERIAL BLOOD GAS, ED
ACID-BASE DEFICIT: 3 mmol/L — AB (ref 0.0–2.0)
Bicarbonate: 19.9 mmol/L — ABNORMAL LOW (ref 20.0–28.0)
O2 Saturation: 98 %
PH ART: 7.445 (ref 7.350–7.450)
TCO2: 21 mmol/L (ref 0–100)
pCO2 arterial: 29 mmHg — ABNORMAL LOW (ref 32.0–48.0)
pO2, Arterial: 94 mmHg (ref 83.0–108.0)

## 2016-05-16 LAB — TROPONIN I: TROPONIN I: 0.05 ng/mL — AB (ref <0.03)

## 2016-05-16 LAB — I-STAT BETA HCG BLOOD, ED (MC, WL, AP ONLY): I-stat hCG, quantitative: <5 m[IU]/mL (ref <5)

## 2016-05-16 MED ORDER — ORAL CARE MOUTH RINSE
15.0000 mL | Freq: Two times a day (BID) | OROMUCOSAL | Status: DC
  Administered 2016-05-17 – 2016-05-21 (×9): 15 mL via OROMUCOSAL

## 2016-05-16 MED ORDER — VALPROATE SODIUM 250 MG/5ML PO SOLN
200.0000 mg | Freq: Three times a day (TID) | ORAL | Status: DC
  Administered 2016-05-16 – 2016-05-18 (×6): 200 mg
  Filled 2016-05-16: qty 4
  Filled 2016-05-16: qty 5
  Filled 2016-05-16: qty 4
  Filled 2016-05-16 (×4): qty 5

## 2016-05-16 MED ORDER — SODIUM CHLORIDE 0.9 % IV SOLN
INTRAVENOUS | Status: DC
  Administered 2016-05-16 – 2016-05-17 (×2): via INTRAVENOUS

## 2016-05-16 MED ORDER — HEPARIN SODIUM (PORCINE) 5000 UNIT/ML IJ SOLN
5000.0000 [IU] | Freq: Three times a day (TID) | INTRAMUSCULAR | Status: DC
  Administered 2016-05-16 – 2016-05-21 (×15): 5000 [IU] via SUBCUTANEOUS
  Filled 2016-05-16 (×15): qty 1

## 2016-05-16 MED ORDER — ACETAMINOPHEN 160 MG/5ML PO SOLN
1000.0000 mg | Freq: Once | ORAL | Status: AC
  Administered 2016-05-16: 1000 mg via ORAL
  Filled 2016-05-16: qty 40.6

## 2016-05-16 MED ORDER — NOREPINEPHRINE BITARTRATE 1 MG/ML IV SOLN
2.0000 ug/min | INTRAVENOUS | Status: DC
  Filled 2016-05-16: qty 4

## 2016-05-16 MED ORDER — ASPIRIN 81 MG PO CHEW
81.0000 mg | CHEWABLE_TABLET | Freq: Every day | ORAL | Status: DC
  Administered 2016-05-16 – 2016-05-21 (×6): 81 mg
  Filled 2016-05-16 (×6): qty 1

## 2016-05-16 MED ORDER — PIPERACILLIN-TAZOBACTAM 3.375 G IVPB
3.3750 g | Freq: Three times a day (TID) | INTRAVENOUS | Status: DC
  Administered 2016-05-16 – 2016-05-18 (×5): 3.375 g via INTRAVENOUS
  Filled 2016-05-16 (×7): qty 50

## 2016-05-16 MED ORDER — NOREPINEPHRINE BITARTRATE 1 MG/ML IV SOLN
0.0000 ug/min | Freq: Once | INTRAVENOUS | Status: AC
  Administered 2016-05-16: 2 ug/min via INTRAVENOUS
  Filled 2016-05-16: qty 4

## 2016-05-16 MED ORDER — SODIUM CHLORIDE 0.9 % IV BOLUS (SEPSIS)
500.0000 mL | Freq: Once | INTRAVENOUS | Status: AC
  Administered 2016-05-16: 500 mL via INTRAVENOUS

## 2016-05-16 MED ORDER — IPRATROPIUM-ALBUTEROL 0.5-2.5 (3) MG/3ML IN SOLN
3.0000 mL | Freq: Four times a day (QID) | RESPIRATORY_TRACT | Status: DC
  Administered 2016-05-16 – 2016-05-20 (×16): 3 mL via RESPIRATORY_TRACT
  Filled 2016-05-16 (×16): qty 3

## 2016-05-16 MED ORDER — FAMOTIDINE 40 MG/5ML PO SUSR
20.0000 mg | Freq: Every day | ORAL | Status: DC
  Administered 2016-05-16 – 2016-05-18 (×3): 20 mg
  Filled 2016-05-16 (×4): qty 2.5

## 2016-05-16 MED ORDER — CHLORHEXIDINE GLUCONATE 0.12 % MT SOLN
15.0000 mL | Freq: Two times a day (BID) | OROMUCOSAL | Status: DC
  Administered 2016-05-16 – 2016-05-21 (×10): 15 mL via OROMUCOSAL
  Filled 2016-05-16 (×10): qty 15

## 2016-05-16 MED ORDER — PIPERACILLIN-TAZOBACTAM 3.375 G IVPB 30 MIN
3.3750 g | Freq: Once | INTRAVENOUS | Status: AC
  Administered 2016-05-16: 3.375 g via INTRAVENOUS
  Filled 2016-05-16: qty 50

## 2016-05-16 MED ORDER — VANCOMYCIN HCL IN DEXTROSE 1-5 GM/200ML-% IV SOLN
1000.0000 mg | INTRAVENOUS | Status: DC
  Filled 2016-05-16: qty 200

## 2016-05-16 MED ORDER — SODIUM CHLORIDE 0.9 % IV BOLUS (SEPSIS)
2000.0000 mL | Freq: Once | INTRAVENOUS | Status: AC
  Administered 2016-05-16: 2000 mL via INTRAVENOUS

## 2016-05-16 MED ORDER — VANCOMYCIN HCL IN DEXTROSE 1-5 GM/200ML-% IV SOLN
1000.0000 mg | Freq: Once | INTRAVENOUS | Status: AC
  Administered 2016-05-16: 1000 mg via INTRAVENOUS
  Filled 2016-05-16: qty 200

## 2016-05-16 MED ORDER — SODIUM CHLORIDE 0.9 % IV BOLUS (SEPSIS)
1000.0000 mL | Freq: Once | INTRAVENOUS | Status: AC
  Administered 2016-05-16: 1000 mL via INTRAVENOUS

## 2016-05-16 MED ORDER — INSULIN ASPART 100 UNIT/ML ~~LOC~~ SOLN
0.0000 [IU] | SUBCUTANEOUS | Status: DC
  Administered 2016-05-19 – 2016-05-21 (×5): 1 [IU] via SUBCUTANEOUS

## 2016-05-16 MED ORDER — SODIUM CHLORIDE 0.9 % IV SOLN: 250.0000 mL | INTRAVENOUS | Status: DC | PRN

## 2016-05-16 NOTE — ED Notes (Signed)
Report attempted 

## 2016-05-16 NOTE — Progress Notes (Signed)
Pharmacy Antibiotic Note  Katherine Palmer is a 55 y.o. female admitted on 05/16/2016 with sepsis.  Pharmacy has been consulted for vancomycin and Zosyn dosing.   Plan: -vancomycin 1g IV q24h with first dose now. Goal trough 15-2020mcg/mL -Zosyn 3.375g IV q8h EI -follow c/s, clinical progression, renal function, trough at SS    Temp (24hrs), Avg:104.4 F (40.2 C), Min:103.1 F (39.5 C), Max:105.6 F (40.9 C)   Recent Labs Lab 05/16/16 1233 05/16/16 1254  WBC 16.1*  --   LATICACIDVEN  --  4.88*    CrCl cannot be calculated (Patient's most recent lab result is older than the maximum 21 days allowed.).    No Known Allergies  Antimicrobials this admission: Vanc 8/31 >>  Zosyn 8/31 >>   Dose adjustments this admission: n/a  Microbiology results: 8/31 BCx:  8/31 UCx:     Thank you for allowing pharmacy to be a part of this patient's care.  Bonner Larue D. Tove Wideman, PharmD, BCPS Clinical Pharmacist Pager: [phone] 05/16/2016 2:15 PM

## 2016-05-16 NOTE — Progress Notes (Signed)
Patiet arrived from ED. BP wnl, RA, mostly nonverbal,  But can pronounce yes, no ad some words with slurred speech, appropriately. Follows commands. Colostomy bag is leaking, bags are ordered, waiting on Wound and ostomy nurse. Open wounds on Lt hand, bilateral breasts, Rt leg, bottom cheek and a large ulcer that includes bottom and back. Also sal open wound on upper back. Lt top ear is missing. Skin has different tone of color I different places.   Elink is aware, pt is on Levo at 2, as well as LOC.

## 2016-05-16 NOTE — H&P (Signed)
PULMONARY / CRITICAL CARE MEDICINE   Name: Katherine Palmer MRN: [id number] DOB: [birth year]    ADMISSION DATE:  05/16/2016 CONSULTATION DATE:  05/16/16  REFERRING MD:  Patria Mane - EDP  CHIEF COMPLAINT:  Fever  HISTORY OF PRESENT ILLNESS:   Katherine Palmer is a 55 y.o. 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  She was brought to [facility] ED 8/31 due to fever of 105F and AMS.  She had apparently been in her usual state of health earlier in the week and just one week prior had been feeding herself and talking with family.  On day of ED presentation, she was encephalopathic and not really verbal.  In ED, old stool was noted around foley site (of note pt has colostomy). Sacral decub wound dressings noted to be saturated in ED.  New right facial droop also noted per family, ? New CVA vs fever related.  In ED, she was found to be febrile to 105F and hypotensive with SBP's in the 80's.  She was given 1L IVF without any improvement.  She had minimal UOP in old foley but after this was replaced, she had some UOP after roughly 45 minutes.  UA is pending.  CXR clear.  CVL was placed in ED and PCCM was called for admission.  Of note had recent admission to [facility] where she had diverting colostomy performed to allow sacral decub to heal. 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  PAST MEDICAL HISTORY :  She  has a past medical history of Decubitus ulcer of sacral region, stage 4 (HCC); Dysphagia; Dysphagia as late effect of cerebrovascular disease; Hyperlipidemia; Hypertension; Seizures (HCC); and Stroke (HCC).  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  No Known Allergies  No current facility-administered medications on file prior to encounter.    Current Outpatient Prescriptions on File Prior to Encounter    Medication Sig  . acetaminophen (TYLENOL) 325 MG tablet Place 650 mg into feeding tube every 4 (four) hours as needed for mild pain.   Marland Kitchen aspirin EC 81 MG tablet 81 mg daily. Via G-Tube  . bisacodyl (DULCOLAX) 10 MG suppository Place 10 mg rectally daily as needed for moderate constipation.  Marland Kitchen buPROPion (WELLBUTRIN) 75 MG tablet Place 150 mg into feeding tube 2 (two) times daily.  . cloNIDine (CATAPRES) 0.1 MG tablet Take 0.1 mg by mouth every 6 (six) hours as needed (blood pressure).   Marland Kitchen FA-B6-B12-ARGININE-BLACKPEPPER PO Give 1 tablet by tube daily.  . feeding supplement (BOOST HIGH PROTEIN) LIQD Take 1 Container by mouth 3 (three) times daily between meals.  Marland Kitchen ipratropium (ATROVENT) 0.02 % nebulizer solution Take 2.5 mg by nebulization every 4 (four) hours as needed for wheezing or shortness of breath.  Marland Kitchen LORazepam (ATIVAN) 0.5 MG tablet Take 0.5 mg by mouth every 8 (eight) hours as needed for anxiety.  . magnesium hydroxide (MILK OF MAGNESIA) 400 MG/5ML suspension Take 30 mLs by mouth daily as needed for mild constipation.  . Multiple Vitamins-Minerals (DECUBI-VITE) CAPS Take 1 capsule by mouth daily.  . OxyCODONE HCl, Abuse Deter, (OXAYDO) 5 MG TABA Take 5 mg by mouth every 6 (six) hours as needed (pain).   . polyethylene glycol (MIRALAX / GLYCOLAX) packet Place 17 g into feeding tube daily.  . pravastatin (PRAVACHOL) 40 MG tablet Place 40  mg into feeding tube daily.  . Prenatal Ca Carb-B6-B12-FA (FOLBECAL) 1 MG TABS Give 1 tablet by tube daily.  . Valproate Sodium (DEPAKENE PO) Give 200 mg by tube every 8 (eight) hours.    FAMILY HISTORY:  Her has no family status information on file.    SOCIAL HISTORY: She  reports that she has quit smoking. She has never used smokeless tobacco.  REVIEW OF SYSTEMS:  Unable to obtain as pt is encephalopathic.  SUBJECTIVE:   Remains encephalopathic.  VITAL SIGNS: BP (!) 75/60   Pulse (!) 134   Temp (!) 104.4 F (40.2 C) Comment: temp foley   Resp (!) 34   SpO2 99%   HEMODYNAMICS:    VENTILATOR SETTINGS:    INTAKE / OUTPUT: No intake/output data recorded.  PHYSICAL EXAMINATION: General:  Young female, frail, debilitated. Neuro:  L sided hemiparesis.  Opens eyes to names, tracks intermittently.  Not speaking. HEENT:  MM very dry, crusting to lips.  No JVD. Cardiovascular:  Tachy, regular, no M/R/G. Lungs:  Clear bilaterally, resps even and unlabored. Abdomen:  Soft, PEG C/D/I, right colostomy that is leaking malodorous liquid stool. Musculoskeletal:  L AKA (upper thigh). No edema. Skin:  Warm, dry.   See sacral wound photos below.         LABS:  BMET  Recent Labs Lab 05/16/16 1233  NA 151*  K 4.8  CL 121*  CO2 19*  BUN 58*  CREATININE 0.58  GLUCOSE 128*    Electrolytes  Recent Labs Lab 05/16/16 1233  CALCIUM 8.6*    CBC  Recent Labs Lab 05/16/16 1233  WBC 16.1*  HGB 10.7*  HCT 37.8  PLT 478*    Coag's No results for input(s): APTT, INR in the last 168 hours.  Sepsis Markers  Recent Labs Lab 05/16/16 1254  LATICACIDVEN 4.88*    ABG  Recent Labs Lab 05/16/16 1253  PHART 7.445  PCO2ART 29.0*  PO2ART 94.0    Liver Enzymes  Recent Labs Lab 05/16/16 1233  AST 46*  ALT 31  ALKPHOS 80  BILITOT 0.7  ALBUMIN 1.9*    Cardiac Enzymes No results for input(s): TROPONINI, PROBNP in the last 168 hours.  Glucose No results for input(s): GLUCAP in the last 168 hours.  Imaging Dg Chest Portable 1 View  Result Date: 05/16/2016 CLINICAL DATA:  55 year old presenting with acute mental status changes. Personal history of stroke in seizures. Current history of hypertension and sacral decubitus ulcer. EXAM: PORTABLE CHEST 1 VIEW COMPARISON:  None. FINDINGS: Cardiac silhouette upper normal in size for AP portable technique. Lungs clear. Pulmonary vascularity normal. No visible pleural effusions. Old healed right rib fractures. Harrington rod traverses the thoracic and upper  lumbar spine. IMPRESSION: No acute cardiopulmonary disease. Electronically Signed   By: Hulan Saashomas  Lawrence M.D.   On: 05/16/2016 13:17     STUDIES:  CXR 8/31 > negative.  CULTURES: Blood 8/31 > Urine 8/31 > Sputum 8/31 > Sacral decub 8/31 >  ANTIBIOTICS: Vanc 8/31 > Zosyn 8/31 >  SIGNIFICANT EVENTS: 8/31 > admitted with sepsis of unclear etiology, developing shock (favor septic as well as hypovolemic).  Likely sources include sacral decub, LLE wound, C.diff, PICC line infxn.  LINES/TUBES: L [place] CVL 8/31 >  DISCUSSION: 55 y.o. F from nursing home due to remote CVA, brought to ED with fever to 105F.  Sacral decub dressings found to be saturated in ED.  Found to have sepsis vs developing shock (favor septic as well as hypovolemic),  likely sources include sacral decub, LLE wound, C.diff, PICC line infxn.  ASSESSMENT / PLAN:  INFECTIOUS A: Shock - combo of septic + hypovolemic.  Sources include uti v wound v bacteremia/line infection (RUE midline) v CDiff v intra-abd source  Presumed UTI. Multiple wounds including large sacral decub.  P:   Broad spectrum abx as above.  Pan culture.  Trend pct, lactate.  Check CDiff -- long course abx over last few weeks for wound infection, watery stool from ostomy, high fever, leukocytosis.  CARDIOVASCULAR A:  Shock - favor septic as well as hypovolemic. Hx HTN. P:  Hold home anti-HTN.  Continue volume resuscitation - needs more fluid.  Pressors if needed to maintain MAP >65. CVP q4hrs. Assess cortisol. Trend lactate, troponin.  PULMONARY A: Tachypnea. Respiratory compensation for metabolic acidosis.  No obvious infiltrate on CXR.  P:   Supplemental O2 as needed to keep sats >92. Low threshold for intubation if mental status does not improve. F/u CXR.  Pulmonary hygiene.  F/u ABG.  RENAL A: Hypernatremia. Metabolic acidosis - nonAG. P:   Volume resuscitation as above.  F/u lactate.  F/u chem.   Replace old foley  catheter.  GASTROINTESTINAL A: Dysphagia - s/p PEG but eats per family.  Previous colostomy - diverting in setting large sacral decub.  Diarrhea -- extremely watery colostomy outpt, ? C.diff with recent abx exposure due to gangrenous LLE. P:   NPO for now.  Pepcid.   HEMATOLOGIC A: Leukocytosis.  Mild anemia.  P:  F/u CBC.  SQ heparin.   ENDOCRINE A: Hyperglycemia   - no known hx DM. P:   SSI.   NEUROLOGIC A: Hx severe CVA with residual left hemiparesis.  ? New right facial droop.  P:   CT head.  Consider neuro input - hold off for now.  Avoid sedation.  Poor functional status at baseline - was "improving" per family now able to feed herself intermittently, wheelchair bound.   FAMILY  - Updates: Family updated at bedside.  They would like to continue with FULL CODE status for now.  They would not want any long term heroic measures or to prolong non-reversible injury, etc.  - Inter-disciplinary family meet or Palliative Care meeting due by:  05/22/16.   CC time: 40 minutes.   Rutherford Guys, [place] - [facility] Pager: [phone]  or [phone] 05/16/2016, 4:08 PM

## 2016-05-16 NOTE — Progress Notes (Signed)
Called Elink regarding 4.5 CVP after 1L bolus, ST to 160s with intermittent ventricular bigeminy. CCM stated to watch and they were ok with CVP. Will continue to monitor closely. Melina Schoolshompson, Orley Lawry E, [place]RN 05/16/2016 ]10:12 PM

## 2016-05-16 NOTE — Progress Notes (Signed)
Patient ID: Katherine Palmer, female   DOB: October 05, 1960, 55 y.o.   MRN: [id number]     Location:   [facility] Room Number: 112-B Place of Service:  SNF (31)   CODE STATUS: Full Code  No Known Allergies  Chief Complaint  Patient presents with  . Acute Visit    HPI:  I have been asked to evaluate her for respiratory distress. She is not responsive. Her respirations are labored; her blood pressure is elevated and unable to obtain an 02 sat. She will need to have emergent care. She has completed all abt.    Past Medical History:  Diagnosis Date  . Decubitus ulcer of sacral region, stage 4 (HCC)   . Dysphagia   . Dysphagia as late effect of cerebrovascular disease   . Hyperlipidemia   . Hypertension   . Seizures (HCC)   . Stroke [facility])     Past Surgical History:  Procedure Laterality Date  . 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 COLOSTOMY    . PEG TUBE PLACEMENT      Social History   Social History  . Marital status: Divorced    Spouse name: N/A  . Number of children: N/A  . Years of education: N/A   Occupational History  . Not on file.   Social History Main Topics  . Smoking status: Former Games developer  . Smokeless tobacco: Never Used  . Alcohol use Not on file  . Drug use: Unknown  . Sexual activity: No   Other Topics Concern  . Not on file   Social History Narrative  . No narrative on file   Family History  Problem Relation Age of Onset  . Family history unknown: Yes      VITAL SIGNS BP (!) 132/112   Pulse (!) 146   Resp (!) 32   Ht 5\' 3"  (1.6 m)   Wt 102 lb 9 oz (46.5 kg)   BMI 18.17 kg/m   Patient's Medications  New Prescriptions   No medications on file  Previous Medications   ACETAMINOPHEN (TYLENOL) 325 MG TABLET    Place 650 mg into feeding tube every 4 (four) hours as needed.   ASPIRIN EC 81 MG TABLET    81 mg daily. Via G-Tube   BISACODYL (DULCOLAX) 10 MG SUPPOSITORY    Place 10 mg rectally daily as needed for moderate  constipation.   BUPROPION (WELLBUTRIN) 75 MG TABLET    Place 150 mg into feeding tube 2 (two) times daily.   CLONIDINE (CATAPRES) 0.1 MG TABLET    Take 0.1 mg by mouth every 6 (six) hours as needed.   COLLAGENASE (SANTYL) OINTMENT    Apply 1 application topically daily. Apply to two wounds on left breast every day, apply to right breast wound every day, apply to back wound daily   FA-B6-B12-ARGININE-BLACKPEPPER PO    Give 1 tablet by tube daily.   FEEDING SUPPLEMENT (BOOST HIGH PROTEIN) LIQD    Take 1 Container by mouth 3 (three) times daily between meals.   IPRATROPIUM (ATROVENT) 0.02 % NEBULIZER SOLUTION    Take 2.5 mg by nebulization every 4 (four) hours as needed for wheezing or shortness of breath.   LORAZEPAM (ATIVAN) 0.5 MG TABLET    Take 0.5 mg by mouth every 8 (eight) hours as needed for anxiety.   MAGNESIUM HYDROXIDE (MILK OF MAGNESIA) 400 MG/5ML SUSPENSION    Take 30 mLs by mouth daily as needed for mild constipation.  MULTIPLE VITAMINS-MINERALS (DECUBI-VITE) CAPS    Take 1 capsule by mouth daily.   OXYCODONE HCL, ABUSE DETER, (OXAYDO) 5 MG TABA    Take 5 mg by mouth every 6 (six) hours as needed.   PANTOPRAZOLE (PROTONIX) 40 MG TABLET    40 mg daily.   POLYETHYLENE GLYCOL (MIRALAX / GLYCOLAX) PACKET    Place 17 g into feeding tube daily.   PRAVASTATIN (PRAVACHOL) 40 MG TABLET    Place 40 mg into feeding tube daily.   PRENATAL CA CARB-B6-B12-FA (FOLBECAL) 1 MG TABS    Give 1 tablet by tube daily.   SODIUM CHLORIDE FLUSH (NORMAL SALINE FLUSH) 0.9 % SOLN    Inject 10 mLs into the vein. Every shift   VALPROATE SODIUM (DEPAKENE PO)    Give 200 mg by tube every 8 (eight) hours.  Modified Medications   No medications on file  Discontinued Medications     SIGNIFICANT DIAGNOSTIC EXAMS  03-28-16: chest x-ray: 1. Improvement in lung aeration since prior study with a decrease in pleural effusions. There is central vascular congestion but no convincing pulmonary edema.   04-23-16: 2-d echo:  1. technically difficult study with limited views 2. Normal appearing left ventricular systolic function. EF 55-60% 3. Mild sclerotic aortic valve no significant stenosis by velocity 4. Mild concentric left ventricular hypertrophy 5. Moderate mitral regurgitation 6. Mild tricuspid regurgitation 7. Highly mobile interatrial septum with a suggestion of a septal defect. However, the septum and the valvular structures are not well visualized to determine if there is a defect or evidence of vegetations. 8. Recommend transesophageal echocardiogram for better visualization of te left atrial septum in addition to better visualize the valvular structures given the patient's history   LABS REVIEWED:   04-08-16: wbc 9.7; hgb 8.9; hct 26.5; plt 330; glucose 122; bun 11; creat 0.21; k+ 4.1; na++ 128; liver normal albumin 2.2  04-12-16: wbc 15.0; hgb 8.8; hct 26.6; mcv 94.0; plt 386; glucose 125; bun 11; creat 0.21; k+ 4.0; na++ 132; liver normal albumin 2.0; chol 77; ldl 35; trig 81; hdl 26; iron 10; tibc 23; ferritin 471; depakote 15.4; hgb a1c 5.7; abs neut: 8400; abs mono 3750; abs baso 0      Review of Systems  Unable to perform ROS: Other    Physical Exam  Constitutional: in distress Frail    Neck: Neck supple. No JVD present. No thyromegaly present.  Cardiovascular: tachycardic  Respiratory: increased effort and rate; diminished sounds   GI: Soft. Bowel sounds are normal. She exhibits no distension. There is no tenderness.  Has diverting colostomy   Genitourinary:  Genitourinary Comments: Has foley  Musculoskeletal: She exhibits no edema.  Is status post CVA Is able to move right upper extremity Unable to move left upper extremity: is stiff to passive range of motion Is status post left aka Right lower extremity is contracted   Lymphadenopathy:    She has no cervical adenopathy.  Neurological: not verbally responsive  Skin: Skin is warm and dry. She is not diaphoretic. picc line in  place   Stage IV sacral wound with wound vac in place 36 x 15 x 1.5 cm with 2 cm undermining at 2:00      ASSESSMENT/ PLAN:  1. Respiratory distress: AMS  unable to obtain an 02 sat; very concerned about sepsis; will send to the ED for further evaluation and treatment plans.     Time spent with patient  45  minutes >50% time spent counseling; reviewing medical  record; tests; labs; and developing future plan of care   MD is aware of resident's narcotic use and is in agreement with current plan of care. We will attempt to wean resident as apropriate   Synthia Innocent NP [facility]  Contact [phone] Monday through Friday 8am- 5pm  After hours call [phone]

## 2016-05-16 NOTE — Progress Notes (Signed)
New colostomy bag is applied.

## 2016-05-16 NOTE — ED Provider Notes (Addendum)
[facility]-EMERGENCY DEPT Provider Note   CSN: [id number] Arrival date & time: 05/16/16  1239     History   Chief Complaint Chief Complaint  Patient presents with  . Altered Mental Status  . Code Sepsis   Level V caveat: altered mental status  HPI Felix AhmadiDiane Hoots is a 55 y.o. female.  HPI Pt presents from nursing home with fever to 105.6. Sent to ER by MD at facility after noting elevated respiratory rate and new AMS. Recent diverting colostomy for sacra decub and left high AKA for ischemic left lower extremity. Improving at nursing home over past several weeks and was seen well several days ago. Present with indwelling foley cath in place. Stool coming from side of ostomy bag and sacral decubitus wet to dry bandages soaked in stool.   Pt unable to provide additional hx   Past Medical History:  Diagnosis Date  . Decubitus ulcer of sacral region, stage 4 (HCC)   . Dysphagia   . Dysphagia as late effect of cerebrovascular disease   . Hyperlipidemia   . Hypertension   . Seizures (HCC)   . Stroke [facility](HCC)     Patient Active Problem List   Diagnosis Date Noted  . Acute respiratory distress (HCC) 05/16/2016  . Altered mental status 05/16/2016  . Septic shock (HCC) 05/16/2016  . Severe sepsis (HCC) 05/16/2016  . Lactic acidosis 05/16/2016  . Encounter for central line placement   . Hypovolemic shock (HCC)   . Hypernatremia   . Acute encephalopathy   . Cerebral infarction due to unspecified mechanism   . Hx of AKA (above knee amputation) (HCC)   . Sepsis (HCC) 05/14/2016  . Leukocytosis 05/13/2016  . CVA (cerebral vascular accident) (HCC) 04/11/2016  . Hemiparesis affecting left side as late effect of cerebrovascular accident (CVA) (HCC) 04/11/2016  . Essential hypertension, benign 04/11/2016  . Sacral decubitus ulcer 04/11/2016  . Failure to thrive syndrome, adult 04/11/2016  . Dysphagia due to old cerebrovascular accident 04/11/2016  . Depression with anxiety 04/11/2016  .  Colostomy present (HCC) 04/11/2016  . S/P AKA (above knee amputation) (HCC) 04/11/2016  . Anemia of chronic disease 04/11/2016  . Smoker 04/11/2016    Past Surgical History:  Procedure Laterality Date  . 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 COLOSTOMY    . PEG TUBE PLACEMENT      OB History    No data available       Home Medications    Prior to Admission medications   Medication Sig Start Date End Date Taking? Authorizing Provider  acetaminophen (TYLENOL) 325 MG tablet Place 650 mg into feeding tube every 4 (four) hours as needed for mild pain.    Yes Historical Provider, MD  albuterol (PROVENTIL) (2.5 MG/3ML) 0.083% nebulizer solution Take 2.5 mg by nebulization every 4 (four) hours as needed for wheezing or shortness of breath.   Yes Historical Provider, MD  aspirin EC 81 MG tablet 81 mg daily. Via G-Tube   Yes Historical Provider, MD  bisacodyl (DULCOLAX) 10 MG suppository Place 10 mg rectally daily as needed for moderate constipation.   Yes Historical Provider, MD  buPROPion (WELLBUTRIN) 75 MG tablet Place 150 mg into feeding tube 2 (two) times daily.   Yes Historical Provider, MD  cloNIDine (CATAPRES) 0.1 MG tablet Take 0.1 mg by mouth every 6 (six) hours as needed (blood pressure).    Yes Historical Provider, MD  FA-B6-B12-ARGININE-BLACKPEPPER PO Give 1 tablet by tube daily.   Yes Historical Provider,  MD  feeding supplement (BOOST HIGH PROTEIN) LIQD Take 1 Container by mouth 3 (three) times daily between meals.   Yes Historical Provider, MD  ipratropium (ATROVENT) 0.02 % nebulizer solution Take 2.5 mg by nebulization every 4 (four) hours as needed for wheezing or shortness of breath.   Yes Historical Provider, MD  LORazepam (ATIVAN) 0.5 MG tablet Take 0.5 mg by mouth every 8 (eight) hours as needed for anxiety.   Yes Historical Provider, MD  magnesium hydroxide (MILK OF MAGNESIA) 400 MG/5ML suspension Take 30 mLs by mouth daily as needed for mild constipation.   Yes  Historical Provider, MD  Multiple Vitamins-Minerals (DECUBI-VITE) CAPS Take 1 capsule by mouth daily.   Yes Historical Provider, MD  OxyCODONE HCl, Abuse Deter, (OXAYDO) 5 MG TABA Take 5 mg by mouth every 6 (six) hours as needed (pain).    Yes Historical Provider, MD  pantoprazole sodium (PROTONIX) 40 mg/20 mL PACK Place 40 mg into feeding tube daily.   Yes Historical Provider, MD  polyethylene glycol (MIRALAX / GLYCOLAX) packet Place 17 g into feeding tube daily.   Yes Historical Provider, MD  pravastatin (PRAVACHOL) 40 MG tablet Place 40 mg into feeding tube daily.   Yes Historical Provider, MD  Prenatal Ca Carb-B6-B12-FA (FOLBECAL) 1 MG TABS Give 1 tablet by tube daily.   Yes Historical Provider, MD  Valproate Sodium (DEPAKENE PO) Give 200 mg by tube every 8 (eight) hours.   Yes Historical Provider, MD    Family History Family History  Problem Relation Age of Onset  . Family history unknown: Yes    Social History Social History  Substance Use Topics  . Smoking status: Former Games developer  . Smokeless tobacco: Never Used  . Alcohol use Not on file     Allergies   Review of patient's allergies indicates no known allergies.   Review of Systems Review of Systems  Unable to perform ROS: Mental status change     Physical Exam Updated Vital Signs BP 109/63   Pulse (!) 101   Temp 99 F (37.2 C) (Oral)   Resp 19   Wt 102 lb 9 oz (46.5 kg)   SpO2 100%   BMI 18.17 kg/m   Physical Exam  Constitutional:  Ill appearing.   HENT:  Head: Atraumatic.  Mild left sided facial droop  Eyes: EOM are normal.  Neck: Normal range of motion. Neck supple.  Cardiovascular: Regular rhythm and normal heart sounds.   tachycardic  Pulmonary/Chest: Effort normal and breath sounds normal.  Abdominal: Soft. She exhibits no distension. There is no tenderness.  g tube in place. Leaking diverting colosotomy  Musculoskeletal:  Left well healed high AKA. Contracture of right lower extremity. Normal  UE's. PICC in left UE without surrounding signs of infection.large stage 4 sacral decubitus without surrounding signs of infection  Neurological: She is alert.  Skin: Skin is warm and dry. No rash noted.  Psychiatric:  Unable to test  Nursing note and vitals reviewed.    ED Treatments / Results  Labs (all labs ordered are listed, but only abnormal results are displayed) Labs Reviewed  COMPREHENSIVE METABOLIC PANEL - Abnormal; Notable for the following:       Result Value   Sodium 151 (*)    Chloride 121 (*)    CO2 19 (*)    Glucose, Bld 128 (*)    BUN 58 (*)    Calcium 8.6 (*)    Total Protein 8.2 (*)    Albumin 1.9 (*)  AST 46 (*)    All other components within normal limits  CBC WITH DIFFERENTIAL/PLATELET - Abnormal; Notable for the following:    WBC 16.1 (*)    RBC 3.79 (*)    Hemoglobin 10.7 (*)    MCHC 28.3 (*)    RDW 19.2 (*)    Platelets 478 (*)    Neutro Abs 12.1 (*)    Monocytes Absolute 1.8 (*)    All other components within normal limits  URINALYSIS, ROUTINE W REFLEX MICROSCOPIC (NOT AT [facility]) - Abnormal; Notable for the following:    APPearance HAZY (*)    Specific Gravity, Urine 1.034 (*)    Leukocytes, UA MODERATE (*)    All other components within normal limits  TROPONIN I - Abnormal; Notable for the following:    Troponin I 0.05 (*)    All other components within normal limits  VALPROIC ACID LEVEL - Abnormal; Notable for the following:    Valproic Acid Lvl 16 (*)    All other components within normal limits  URINE MICROSCOPIC-ADD ON - Abnormal; Notable for the following:    Squamous Epithelial / LPF 0-5 (*)    Casts HYALINE CASTS (*)    All other components within normal limits  GLUCOSE, CAPILLARY - Abnormal; Notable for the following:    Glucose-Capillary 105 (*)    All other components within normal limits  I-STAT CG4 LACTIC ACID, ED - Abnormal; Notable for the following:    Lactic Acid, Venous 4.88 (*)    All other components within normal  limits  I-STAT ARTERIAL BLOOD GAS, ED - Abnormal; Notable for the following:    pCO2 arterial 29.0 (*)    Bicarbonate 19.9 (*)    Acid-base deficit 3.0 (*)    All other components within normal limits  POCT I-STAT 3, ART BLOOD GAS (G3+) - Abnormal; Notable for the following:    pCO2 arterial 30.8 (*)    Bicarbonate 18.4 (*)    Acid-base deficit 6.0 (*)    All other components within normal limits  CULTURE, BLOOD (ROUTINE X 2)  C DIFFICILE QUICK SCREEN W PCR REFLEX  CULTURE, BLOOD (ROUTINE X 2)  URINE CULTURE  AEROBIC/ANAEROBIC CULTURE (SURGICAL/DEEP WOUND)  CULTURE, BLOOD (ROUTINE X 2)  CULTURE, BLOOD (ROUTINE X 2)  CULTURE, EXPECTORATED SPUTUM-ASSESSMENT  MRSA PCR SCREENING  LACTIC ACID, PLASMA  PROCALCITONIN  LACTIC ACID, PLASMA  TROPONIN I  TROPONIN I  LACTIC ACID, PLASMA  CORTISOL  BLOOD GAS, ARTERIAL  HEMOGLOBIN A1C  CBC  BASIC METABOLIC PANEL  MAGNESIUM  PHOSPHORUS  PROCALCITONIN  I-STAT BETA HCG BLOOD, ED ([facility], [facility], [facility] ONLY)  I-STAT CG4 LACTIC ACID, ED    EKG  EKG Interpretation  Date/Time:  Thursday May 16 2016 12:40:43 EDT Ventricular Rate:  142 PR Interval:    QRS Duration: 70 QT Interval:  325 QTC Calculation: 495 R Axis:   -13 Text Interpretation:  Sinus tachycardia Ventricular tachycardia, unsustained Probable left atrial enlargement Low voltage, extremity leads Borderline prolonged QT interval Baseline wander in lead(s) V2 V5 V6 No old tracing to compare Confirmed by Patsy Zaragoza  MD, Caryn Bee ([id number]) on 05/16/2016 1:55:24 PM       Radiology Ct Head Wo Contrast  Result Date: 05/16/2016 CLINICAL DATA:  Fever. EXAM: CT HEAD WITHOUT CONTRAST TECHNIQUE: Contiguous axial images were obtained from the base of the skull through the vertex without intravenous contrast. COMPARISON:  None. FINDINGS: Brain: Evidence of a prior large right-sided hemispheric infarction with extensive encephalomalacia and ex vacuo  dilatation of the right lateral ventricle. Large areas  of calcification likely due to calcified hematoma. No CT findings for new/acute process. No definite findings for a left-sided infarct. No extra-axial fluid collections. The brainstem and cerebellum are grossly normal. Vascular: Vascular calcifications without obvious aneurysm. Skull: No skull fracture for bone lesion. Sinuses/Orbits: The paranasal sinuses and mastoid air cells are grossly clear. The globes are intact. Other: No scalp hematoma or other soft tissue abnormality is identified. IMPRESSION: 1. Remote large right hemispheric infarction with extensive encephalomalacia and dystrophic calcifications. 2. No definite acute intracranial process. Electronically Signed   By: Rudie Meyer M.D.   On: 05/16/2016 17:27   Dg Chest Port 1 View  Result Date: 05/16/2016 CLINICAL DATA:  LEFT-sided central line placement. EXAM: PORTABLE CHEST 1 VIEW COMPARISON:  Chest radiograph May 16, 2016 at 1303 hours FINDINGS: Interval placement of LEFT subclavian central venous catheter with distal tip projecting in mid superior vena caval. No pneumothorax. Cardiac silhouette is mildly enlarged, mediastinal silhouette is nonsuspicious, mildly calcified aortic arch. Patchy bibasilar airspace opacities. No pleural effusion or focal consolidation. Old RIGHT rib fractures. Single thoracolumbar Harrington rods. Coarse symmetric calcifications in the neck are likely vascular. IMPRESSION: New LEFT subclavian central venous catheter with distal tip projecting in mid superior vena cava. No pneumothorax. Mild cardiomegaly.  Bibasilar atelectasis, less likely pneumonia. Electronically Signed   By: Awilda Metro M.D.   On: 05/16/2016 16:10   Dg Chest Portable 1 View  Result Date: 05/16/2016 CLINICAL DATA:  55 year old presenting with acute mental status changes. Personal history of stroke in seizures. Current history of hypertension and sacral decubitus ulcer. EXAM: PORTABLE CHEST 1 VIEW COMPARISON:  None. FINDINGS: Cardiac  silhouette upper normal in size for AP portable technique. Lungs clear. Pulmonary vascularity normal. No visible pleural effusions. Old healed right rib fractures. Harrington rod traverses the thoracic and upper lumbar spine. IMPRESSION: No acute cardiopulmonary disease. Electronically Signed   By: Hulan Saas M.D.   On: 05/16/2016 13:17    ++++++++++++++++++++++++++++++++++++++++++   Procedures .Central Line Performed by: Azalia Bilis Authorized by: Azalia Bilis     CENTRAL LINE Performed by: Lyanne Co Consent: The procedure was performed in an emergent situation. Required items: required blood products, implants, devices, and special equipment available Patient identity confirmed: arm band and provided demographic data Time out: Immediately prior to procedure a "time out" was called to verify the correct patient, procedure, equipment, support staff and site/side marked as required. Indications: vascular access Anesthesia: local infiltration Local anesthetic: lidocaine 1% with epinephrine Anesthetic total: 3 ml Patient sedated: no Preparation: skin prepped with 2% chlorhexidine Skin prep agent dried: skin prep agent completely dried prior to procedure Sterile barriers: all five maximum sterile barriers used - cap, mask, sterile gown, sterile gloves, and large sterile sheet Hand hygiene: hand hygiene performed prior to central venous catheter insertion Location details: left subclavian Catheter type: triple lumen Catheter size: 8 Fr Pre-procedure: landmarks identified Successful placement: yes Post-procedure: line sutured and dressing applied Assessment: blood return through all parts, free fluid flow, placement verified by x-ray and no pneumothorax on x-ray Patient tolerance: Patient tolerated the procedure well with no immediate complications.   CRITICAL CARE Performed by: Lyanne Co Total critical care time: 40 minutes Critical care time was exclusive of  separately billable procedures and treating other patients. Critical care was necessary to treat or prevent imminent or life-threatening deterioration. Critical care was time spent personally by me on the following activities: development of treatment plan with  patient and/or surrogate as well as nursing, discussions with consultants, evaluation of patient's response to treatment, examination of patient, obtaining history from patient or surrogate, ordering and performing treatments and interventions, ordering and review of laboratory studies, ordering and review of radiographic studies, pulse oximetry and re-evaluation of patient's condition.  +++++++++++++++++++++++++++++++++++++++++     (including critical care time)  Medications Ordered in ED Medications  vancomycin (VANCOCIN) IVPB 1000 mg/200 mL premix (not administered)  piperacillin-tazobactam (ZOSYN) IVPB 3.375 g (3.375 g Intravenous Given 05/16/16 2106)  heparin injection 5,000 Units (5,000 Units Subcutaneous Given 05/16/16 2100)  0.9 %  sodium chloride infusion ( Intravenous Rate/Dose Change 05/16/16 2006)  0.9 %  sodium chloride infusion (not administered)  aspirin chewable tablet 81 mg (81 mg Per Tube Given 05/16/16 1843)  ipratropium-albuterol (DUONEB) 0.5-2.5 (3) MG/3ML nebulizer solution 3 mL (3 mLs Nebulization Given 05/16/16 2032)  Valproate Sodium (DEPAKENE) solution 200 mg (200 mg Per Tube Given 05/16/16 2056)  famotidine (PEPCID) 40 MG/5ML suspension 20 mg (20 mg Per Tube Given 05/16/16 1851)  insulin aspart (novoLOG) injection 0-9 Units (0 Units Subcutaneous Not Given 05/16/16 2000)  chlorhexidine (PERIDEX) 0.12 % solution 15 mL (15 mLs Mouth Rinse Given 05/16/16 2056)  MEDLINE mouth rinse (not administered)  norepinephrine (LEVOPHED) 4 mg in dextrose 5 % 250 mL (0.016 mg/mL) infusion (2 mcg/min Intravenous Rate/Dose Change 05/16/16 2109)  sodium chloride 0.9 % bolus 1,000 mL (0 mLs Intravenous Stopped 05/16/16 1600)    And    sodium chloride 0.9 % bolus 500 mL (0 mLs Intravenous Stopped 05/16/16 1651)  acetaminophen (TYLENOL) solution 1,000 mg (1,000 mg Oral Given 05/16/16 1325)  vancomycin (VANCOCIN) IVPB 1000 mg/200 mL premix (0 mg Intravenous Stopped 05/16/16 1508)  piperacillin-tazobactam (ZOSYN) IVPB 3.375 g (0 g Intravenous Stopped 05/16/16 1438)  norepinephrine (LEVOPHED) 4 mg in dextrose 5 % 250 mL (0.016 mg/mL) infusion (2 mcg/min Intravenous New Bag/Given 05/16/16 1712)  sodium chloride 0.9 % bolus 2,000 mL (2,000 mLs Intravenous New Bag/Given 05/16/16 1609)  sodium chloride 0.9 % bolus 2,000 mL (2,000 mLs Intravenous Given by Other 05/16/16 1730)  norepinephrine (LEVOPHED) 4 mg in dextrose 5 % 250 mL (0.016 mg/mL) infusion (2 mcg/min Intravenous New Bag/Given 05/16/16 2007)  sodium chloride 0.9 % bolus 1,000 mL (1,000 mLs Intravenous Given 05/16/16 2051)     Initial Impression / Assessment and Plan / ED Course  I have reviewed the triage vital signs and the nursing notes.  Pertinent labs & imaging results that were available during my care of the patient were reviewed by me and considered in my medical decision making (see chart for details).  Clinical Course   Severe sepsis on arrival. Fever to 105.6. Urine vs sacral decub vs line sepsis as possible causes. Broad spectrum abx. Left subclavian placed for access and pressors. ICU team consulted. Family updated. Full code per family. pts family understand she is critically ill at this time. Care transferred to ICU team  Final Clinical Impressions(s) / ED Diagnoses   Final diagnoses:  Encounter for central line placement    New Prescriptions Current Discharge Medication List       Azalia Bilis, MD 05/16/16 2209    Azalia Bilis, MD 05/16/16 2210

## 2016-05-16 NOTE — ED Notes (Signed)
Evangeline GulaBrooke Miller, RN attempted to site iv x1 unsuccessfully, and this RN attempted to site iv x2 unsuccessfully. Anell BarrKaren Cobb, RN to attempt to site IV

## 2016-05-16 NOTE — ED Notes (Signed)
Pt came with foley catheter. Catheter balloon port was taped and clamped and had old stool on it. Per Dr. Patria Maneampos-- foley changed to temp foley -- pt's core temp == 105.6.

## 2016-05-16 NOTE — Progress Notes (Signed)
Pt not able to cough up sputum at this time for sputum sample.

## 2016-05-16 NOTE — Progress Notes (Signed)
Repeat sepsis assessment completed.   Dr. Kalman ShanMurali Serrita Lueth, M.D., [facility].C.C.P Pulmonary and Critical Care Medicine Staff Physician [facility] Pager: [phone], If no answer or between  15:00h - 7:00h: call [phone]  05/16/2016 5:29 PM

## 2016-05-16 NOTE — ED Notes (Signed)
Pt has a PICC line in upper right arm, unable

## 2016-05-17 ENCOUNTER — Inpatient Hospital Stay (HOSPITAL_COMMUNITY): Payer: Medicaid Other

## 2016-05-17 DIAGNOSIS — R7881 Bacteremia: Secondary | ICD-10-CM

## 2016-05-17 DIAGNOSIS — Z452 Encounter for adjustment and management of vascular access device: Secondary | ICD-10-CM

## 2016-05-17 DIAGNOSIS — R4182 Altered mental status, unspecified: Secondary | ICD-10-CM

## 2016-05-17 DIAGNOSIS — L8990 Pressure ulcer of unspecified site, unspecified stage: Secondary | ICD-10-CM | POA: Insufficient documentation

## 2016-05-17 LAB — BLOOD CULTURE ID PANEL (REFLEXED)
ACINETOBACTER BAUMANNII: NOT DETECTED
CANDIDA KRUSEI: NOT DETECTED
CANDIDA PARAPSILOSIS: NOT DETECTED
CANDIDA TROPICALIS: NOT DETECTED
Candida albicans: NOT DETECTED
Candida glabrata: NOT DETECTED
ESCHERICHIA COLI: NOT DETECTED
Enterobacter cloacae complex: NOT DETECTED
Enterobacteriaceae species: NOT DETECTED
Enterococcus species: DETECTED — AB
Haemophilus influenzae: NOT DETECTED
KLEBSIELLA OXYTOCA: NOT DETECTED
KLEBSIELLA PNEUMONIAE: NOT DETECTED
Listeria monocytogenes: NOT DETECTED
Neisseria meningitidis: NOT DETECTED
PROTEUS SPECIES: NOT DETECTED
PSEUDOMONAS AERUGINOSA: NOT DETECTED
SERRATIA MARCESCENS: NOT DETECTED
STAPHYLOCOCCUS SPECIES: NOT DETECTED
STREPTOCOCCUS PNEUMONIAE: NOT DETECTED
Staphylococcus aureus (BCID): NOT DETECTED
Streptococcus agalactiae: NOT DETECTED
Streptococcus pyogenes: NOT DETECTED
Streptococcus species: NOT DETECTED
Vancomycin resistance: NOT DETECTED

## 2016-05-17 LAB — BASIC METABOLIC PANEL
ANION GAP: 4 — AB (ref 5–15)
Anion gap: 4 — ABNORMAL LOW (ref 5–15)
BUN: 39 mg/dL — AB (ref 6–20)
BUN: 23 mg/dL — ABNORMAL HIGH (ref 6–20)
CALCIUM: 8.8 mg/dL — AB (ref 8.9–10.3)
CHLORIDE: 127 mmol/L — AB (ref 101–111)
CO2: 21 mmol/L — AB (ref 22–32)
CO2: 19 mmol/L — AB (ref 22–32)
Calcium: 7.9 mg/dL — ABNORMAL LOW (ref 8.9–10.3)
Chloride: 124 mmol/L — ABNORMAL HIGH (ref 101–111)
Creatinine, Ser: <0.3 mg/dL — ABNORMAL LOW (ref 0.44–1.00)
GLUCOSE: 92 mg/dL (ref 65–99)
Glucose, Bld: 81 mg/dL (ref 65–99)
Potassium: 3.3 mmol/L — ABNORMAL LOW (ref 3.5–5.1)
Potassium: 3.9 mmol/L (ref 3.5–5.1)
SODIUM: 152 mmol/L — AB (ref 135–145)
Sodium: 147 mmol/L — ABNORMAL HIGH (ref 135–145)

## 2016-05-17 LAB — TROPONIN I
Troponin I: 0.06 ng/mL (ref <0.03)
Troponin I: 0.05 ng/mL (ref <0.03)

## 2016-05-17 LAB — CBC
HCT: 25.1 % — ABNORMAL LOW (ref 36.0–46.0)
HEMOGLOBIN: 7.1 g/dL — AB (ref 12.0–15.0)
MCH: 28 pg (ref 26.0–34.0)
MCHC: 28.3 g/dL — AB (ref 30.0–36.0)
MCV: 98.8 fL (ref 78.0–100.0)
Platelets: 382 10*3/uL (ref 150–400)
RBC: 2.54 MIL/uL — ABNORMAL LOW (ref 3.87–5.11)
RDW: 19 % — ABNORMAL HIGH (ref 11.5–15.5)
WBC: 16.1 10*3/uL — AB (ref 4.0–10.5)

## 2016-05-17 LAB — GLUCOSE, CAPILLARY
GLUCOSE-CAPILLARY: 89 mg/dL (ref 65–99)
GLUCOSE-CAPILLARY: 84 mg/dL (ref 65–99)
Glucose-Capillary: 89 mg/dL (ref 65–99)
Glucose-Capillary: 87 mg/dL (ref 65–99)
Glucose-Capillary: 76 mg/dL (ref 65–99)
Glucose-Capillary: 92 mg/dL (ref 65–99)

## 2016-05-17 LAB — ECHOCARDIOGRAM COMPLETE
Height: 63 in
WEIGHTICAEL: 1616 [oz_av]

## 2016-05-17 LAB — MAGNESIUM: Magnesium: 2 mg/dL (ref 1.7–2.4)

## 2016-05-17 LAB — PHOSPHORUS: PHOSPHORUS: 2.7 mg/dL (ref 2.5–4.6)

## 2016-05-17 LAB — PROCALCITONIN: PROCALCITONIN: 5.48 ng/mL

## 2016-05-17 LAB — MRSA PCR SCREENING: MRSA BY PCR: NEGATIVE

## 2016-05-17 MED ORDER — POTASSIUM CHLORIDE 10 MEQ/50ML IV SOLN
10.0000 meq | INTRAVENOUS | Status: AC
  Administered 2016-05-17 (×4): 10 meq via INTRAVENOUS
  Filled 2016-05-17 (×5): qty 50

## 2016-05-17 MED ORDER — DAKINS (1/4 STRENGTH) 0.125 % EX SOLN
Freq: Two times a day (BID) | CUTANEOUS | Status: DC
  Administered 2016-05-17 – 2016-05-19 (×6)
  Administered 2016-05-20: 1
  Administered 2016-05-21 (×2)
  Filled 2016-05-17 (×4): qty 473

## 2016-05-17 MED ORDER — VANCOMYCIN HCL IN DEXTROSE 1-5 GM/200ML-% IV SOLN
1000.0000 mg | INTRAVENOUS | Status: DC
  Administered 2016-05-17 – 2016-05-18 (×2): 1000 mg via INTRAVENOUS
  Filled 2016-05-17 (×4): qty 200

## 2016-05-17 MED ORDER — BUPROPION HCL 75 MG PO TABS
75.0000 mg | ORAL_TABLET | Freq: Two times a day (BID) | ORAL | Status: DC
  Administered 2016-05-17 – 2016-05-21 (×9): 75 mg
  Filled 2016-05-17 (×10): qty 1

## 2016-05-17 MED ORDER — SODIUM CHLORIDE 0.45 % IV SOLN
INTRAVENOUS | Status: DC
  Administered 2016-05-17 – 2016-05-18 (×4): via INTRAVENOUS

## 2016-05-17 NOTE — Progress Notes (Addendum)
PHARMACY - PHYSICIAN COMMUNICATION CRITICAL VALUE ALERT - BLOOD CULTURE IDENTIFICATION (BCID)  Results for orders placed or performed during the hospital encounter of 05/16/16  Blood Culture ID Panel (Reflexed) (Collected: 05/16/2016  5:21 PM)  Result Value Ref Range   Enterococcus species DETECTED (A) NOT DETECTED   Vancomycin resistance NOT DETECTED NOT DETECTED   Listeria monocytogenes NOT DETECTED NOT DETECTED   Staphylococcus species NOT DETECTED NOT DETECTED   Staphylococcus aureus NOT DETECTED NOT DETECTED   Streptococcus species NOT DETECTED NOT DETECTED   Streptococcus agalactiae NOT DETECTED NOT DETECTED   Streptococcus pneumoniae NOT DETECTED NOT DETECTED   Streptococcus pyogenes NOT DETECTED NOT DETECTED   Acinetobacter baumannii NOT DETECTED NOT DETECTED   Enterobacteriaceae species NOT DETECTED NOT DETECTED   Enterobacter cloacae complex NOT DETECTED NOT DETECTED   Escherichia coli NOT DETECTED NOT DETECTED   Klebsiella oxytoca NOT DETECTED NOT DETECTED   Klebsiella pneumoniae NOT DETECTED NOT DETECTED   Proteus species NOT DETECTED NOT DETECTED   Serratia marcescens NOT DETECTED NOT DETECTED   Haemophilus influenzae NOT DETECTED NOT DETECTED   Neisseria meningitidis NOT DETECTED NOT DETECTED   Pseudomonas aeruginosa NOT DETECTED NOT DETECTED   Candida albicans NOT DETECTED NOT DETECTED   Candida glabrata NOT DETECTED NOT DETECTED   Candida krusei NOT DETECTED NOT DETECTED   Candida parapsilosis NOT DETECTED NOT DETECTED   Candida tropicalis NOT DETECTED NOT DETECTED    Name of physician (or Provider) Contacted: Dr. Tyson AliasFeinstein  Changes to prescribed antibiotics required: No changes until sensitivities return.  Assessment: Patient with 1/2 gram positive cocci in pairs and chains with BCID positive for enterococcus. Will continue zosyn and vanc to cover for other potential pathogens and enterococcus.   Katherine BachJoseph Aalyssa Elderkin, Cleotis NipperBS, PharmD Clinical Pharmacy  Resident [phone] (Pager) 05/17/2016 10:36 AM

## 2016-05-17 NOTE — Consult Note (Addendum)
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 Moisten multiple 4x4s with Dakins solution.  Place on wound bed.  Cover with ABDs and paper tape. Change twice daily.  WOC Nurse wound follow up Wound type: Stage 4 mid back pressure injury Measurement: 3 x 3 x 1.2 cm Wound bed: 80% yellow slough, 20% pink tissue Drainage (amount, consistency, odor) scant serosanguinous drainage, thin, no odor Periwound: intact, blanchable skin Dressing procedure/placement/frequency: Cleanse with NS, Moisten 2x2 with NS and pack into wound base. Cover with silicone bordered foam.  Change daily.  WOC Nurse wound follow up Wound type: stage 2 breast pressure injury Measurement: 1 x 3 x 0.2 cm Wound bed: 80% yellow slough, 20% pink tissue Drainage (amount, consistency, odor) scant serous drainage, thin, no odor Periwound: intact, blanchable skin Dressing procedure/placement/frequency: Cleanse with NS.  Cover with silicone bordered foam.  Change every 3 days.  Pouch is intact and will order supplies for bedside nurse.   Pt requires a low air loss mattress at transfer out of the unit.  Discussed POC with patient and bedside nurse.  Will see in 5 days for follow up. Thank you, Henriette CombsSarah Rishan Oyama BSN, RN, [facility]OCN

## 2016-05-17 NOTE — Clinical Social Work Placement (Signed)
   CLINICAL SOCIAL WORK PLACEMENT  NOTE  Date:  05/17/2016  Patient Details  Name: Katherine Palmer MRN: [id number] Date of Birth: [birth year]  Clinical Social Work is seeking post-discharge placement for this patient at the Skilled  Nursing Facility level of care (*CSW will initial, date and re-position this form in  chart as items are completed):  Yes   Patient/family provided with [place] Clinical Social Work Department's list of facilities offering this level of care within the geographic area requested by the patient (or if unable, by the patient's family).  Yes   Patient/family informed of their freedom to choose among providers that offer the needed level of care, that participate in Medicare, Medicaid or managed care program needed by the patient, have an available bed and are willing to accept the patient.  Yes   Patient/family informed of [facility], as well as of the fact that they are under no obligation to receive care at these facilities.  PASRR submitted to EDS on 05/17/16     PASRR number received on 05/17/16     Existing PASRR number confirmed on 05/17/16     FL2 transmitted to all facilities in geographic area requested by pt/family on 05/17/16     FL2 transmitted to all facilities within larger geographic area on       Patient informed that his/her managed care company has contracts with or will negotiate with certain facilities, including the following:            Patient/family informed of bed offers received.  Patient chooses bed at       Physician recommends and patient chooses bed at      Patient to be transferred to   on  .  Patient to be transferred to facility by       Patient family notified on   of transfer.  Name of family member notified:        PHYSICIAN Please sign FL2     Additional Comment:    _______________________________________________ Rockwell GermanyAshley N Gardner,  LCSW 05/17/2016, 11:24 AM

## 2016-05-17 NOTE — Progress Notes (Signed)
Echocardiogram 2D Echocardiogram has been performed.  Marisue Humblelexis N Jaslynne Dahan 05/17/2016, 4:34 PM

## 2016-05-17 NOTE — Consult Note (Signed)
[facility]       Reason for Consult: Enterococcal bacteremia    Referring Physician: Dr. Lilli LightFeinstein/CHAMP  Active Problems:   Septic shock (HCC)   Severe sepsis (HCC)   Lactic acidosis   Pressure ulcer   . aspirin  81 mg Per Tube Daily  . buPROPion  75 mg Per Tube BID  . chlorhexidine  15 mL Mouth Rinse BID  . famotidine  20 mg Per Tube Daily  . heparin  5,000 Units Subcutaneous Q8H  . insulin aspart  0-9 Units Subcutaneous Q4H  . ipratropium-albuterol  3 mL Nebulization Q6H  . mouth rinse  15 mL Mouth Rinse q12n4p  . piperacillin-tazobactam (ZOSYN)  IV  3.375 g Intravenous Q8H  . sodium hypochlorite   Irrigation BID  . Valproate Sodium  200 mg Per Tube Q8H    Recommendations: Continue with zosyn Add vancomycin Will narrow based on sensitivities  TTE  Assessment: She has 1/2 blood cultures with Enterococcus, unknown source but certainly GI possible, urine.  Could be source of sepsis.  Broad spectrum antibiotics for now and added vancomycin pending sensitivities (negative vancomycin resistance so not VRE).    Sepsis with AMS, elevated lactate, improving.    Antibiotics: zosyn  HPI: Katherine AhmadiDiane Palmer is a 55 y.o. female NH resident, history of CVA who came in yesterday with Fever to 105, AMS, stool around foley, decubitus ulcer, hypotensive.  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  WBC 16,100, lactate 4.88 initially.  Started on vancomycin and zosyn and now with 1/2 blood cultures with Enterococcus.  Now more alert during my interview, complaining of back pain.  Does not voice other complaints.   CXR independently reviewed and no significant findings, some atelectasis.   Review of Systems:  Constitutional: negative for chills Gastrointestinal: negative for diarrhea Musculoskeletal: positive for back pain, negative for arthralgias All other systems reviewed and are negative   Past Medical History:  Diagnosis Date  . Decubitus ulcer  of sacral region, stage 4 (HCC)   . Dysphagia   . Dysphagia as late effect of cerebrovascular disease   . Hyperlipidemia   . Hypertension   . Seizures (HCC)   . Stroke [facility](HCC)     Social History  Substance Use Topics  . Smoking status: Former Games developermoker  . Smokeless tobacco: Never Used  . Alcohol use Not on file    Family History  Problem Relation Age of Onset  . Family history unknown: Yes    No Known Allergies  Physical Exam: Constitutional: in no apparent distress  Vitals:   05/17/16 1000 05/17/16 1100  BP: 110/68 113/70  Pulse: (!) 104 100  Resp: (!) 22 (!) 28  Temp:     EYES: anicteric ENMT: no thrush Cardiovascular: Cor RRR Respiratory: CTA B; normal respiratory effort GI: Bowel sounds are normal, liver is not enlarged, spleen is not enlarged Musculoskeletal: no pedal edema noted Skin: negatives: no rash Hematologic: no cervical lad  Lab Results  Component Value Date   WBC 16.1 (H) 05/17/2016   HGB 7.1 (L) 05/17/2016   HCT 25.1 (L) 05/17/2016   MCV 98.8 05/17/2016   PLT 382 05/17/2016    Lab Results  Component Value Date   CREATININE <0.30 (L) 05/17/2016   BUN 39 (H) 05/17/2016   NA 152 (H) 05/17/2016   K 3.3 (L) 05/17/2016   CL 127 (H) 05/17/2016   CO2 21 (L) 05/17/2016    Lab Results  Component Value Date  ALT 31 05/16/2016   AST 46 (H) 05/16/2016   ALKPHOS 80 05/16/2016     Microbiology: Recent Results (from the past 240 hour(s))  Culture, blood (Routine x 2)     Status: None (Preliminary result)   Collection Time: 05/16/16 12:33 PM  Result Value Ref Range Status   Specimen Description BLOOD LEFT FOREARM  Final   Special Requests IN PEDIATRIC BOTTLE 1CC  Final   Culture NO GROWTH < 24 HOURS  Final   Report Status PENDING  Incomplete  Culture, blood (Routine x 2)     Status: None (Preliminary result)   Collection Time: 05/16/16  2:05 PM  Result Value Ref Range Status   Specimen Description BLOOD RIGHT FOREARM  Final   Special Requests  BOTTLES DRAWN AEROBIC AND ANAEROBIC 10CC  Final   Culture NO GROWTH < 24 HOURS  Final   Report Status PENDING  Incomplete  C difficile quick scan w PCR reflex     Status: None   Collection Time: 05/16/16  4:51 PM  Result Value Ref Range Status   C Diff antigen NEGATIVE NEGATIVE Final   C Diff toxin NEGATIVE NEGATIVE Final   C Diff interpretation No C. difficile detected.  Final  Culture, blood (Routine X 2) w Reflex to ID Panel     Status: None (Preliminary result)   Collection Time: 05/16/16  5:13 PM  Result Value Ref Range Status   Specimen Description BLOOD A-LINE  Final   Special Requests BOTTLES DRAWN AEROBIC AND ANAEROBIC 5CC   Final   Culture NO GROWTH < 24 HOURS  Final   Report Status PENDING  Incomplete  Culture, blood (Routine X 2) w Reflex to ID Panel     Status: None (Preliminary result)   Collection Time: 05/16/16  5:21 PM  Result Value Ref Range Status   Specimen Description BLOOD BLOOD LEFT FOREARM  Final   Special Requests IN PEDIATRIC BOTTLE 2CC  Final   Culture  Setup Time   Final    GRAM POSITIVE COCCI IN PAIRS AND CHAINS AEROBIC BOTTLE ONLY CRITICAL RESULT CALLED TO, READ BACK BY AND VERIFIED WITH: J FRENS,PHARMD AT 1030 05/17/16 BY L BENFIELD    Culture GRAM POSITIVE COCCI  Final   Report Status PENDING  Incomplete  Blood Culture ID Panel (Reflexed)     Status: Abnormal   Collection Time: 05/16/16  5:21 PM  Result Value Ref Range Status   Enterococcus species DETECTED (A) NOT DETECTED Final    Comment: CRITICAL RESULT CALLED TO, READ BACK BY AND VERIFIED WITH: J FRENS,PHARMD AT 1030 05/17/16 BY L BENFIELD    Vancomycin resistance NOT DETECTED NOT DETECTED Final   Listeria monocytogenes NOT DETECTED NOT DETECTED Final   Staphylococcus species NOT DETECTED NOT DETECTED Final   Staphylococcus aureus NOT DETECTED NOT DETECTED Final   Streptococcus species NOT DETECTED NOT DETECTED Final   Streptococcus agalactiae NOT DETECTED NOT DETECTED Final   Streptococcus  pneumoniae NOT DETECTED NOT DETECTED Final   Streptococcus pyogenes NOT DETECTED NOT DETECTED Final   Acinetobacter baumannii NOT DETECTED NOT DETECTED Final   Enterobacteriaceae species NOT DETECTED NOT DETECTED Final   Enterobacter cloacae complex NOT DETECTED NOT DETECTED Final   Escherichia coli NOT DETECTED NOT DETECTED Final   Klebsiella oxytoca NOT DETECTED NOT DETECTED Final   Klebsiella pneumoniae NOT DETECTED NOT DETECTED Final   Proteus species NOT DETECTED NOT DETECTED Final   Serratia marcescens NOT DETECTED NOT DETECTED Final   Haemophilus influenzae NOT  DETECTED NOT DETECTED Final   Neisseria meningitidis NOT DETECTED NOT DETECTED Final   Pseudomonas aeruginosa NOT DETECTED NOT DETECTED Final   Candida albicans NOT DETECTED NOT DETECTED Final   Candida glabrata NOT DETECTED NOT DETECTED Final   Candida krusei NOT DETECTED NOT DETECTED Final   Candida parapsilosis NOT DETECTED NOT DETECTED Final   Candida tropicalis NOT DETECTED NOT DETECTED Final  MRSA PCR Screening     Status: None   Collection Time: 05/16/16  6:09 PM  Result Value Ref Range Status   MRSA by PCR NEGATIVE NEGATIVE Final    Comment:        The GeneXpert MRSA Assay (FDA approved for NASAL specimens only), is one component of a comprehensive MRSA colonization surveillance program. It is not intended to diagnose MRSA infection nor to guide or monitor treatment for MRSA infections.     Staci Righter, MD [facility] [email or web address] [phone] pager  [phone] cell 05/17/2016, 11:50 AM

## 2016-05-17 NOTE — Progress Notes (Addendum)
PULMONARY / CRITICAL CARE MEDICINE   Name: Sharisse Rantz MRN: [id number] DOB: February 16, 1961    ADMISSION DATE:  05/16/2016 CONSULTATION DATE:  05/16/16  REFERRING MD:  Patria Mane - EDP  CHIEF COMPLAINT:  Fever  HISTORY OF PRESENT ILLNESS:   Nysia Dell is a 55 y.o. 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  She was brought to [facility] ED 8/31 due to fever of 105F and AMS.  She had apparently been in her usual state of health earlier in the week and just one week prior had been feeding herself and talking with family.  On day of ED presentation, she was encephalopathic and not really verbal.  In ED, old stool was noted around foley site (of note pt has colostomy). Sacral decub wound dressings noted to be saturated in ED.  New right facial droop also noted per family, ? New CVA vs fever related.  In ED, she was found to be febrile to 105F and hypotensive with SBP's in the 80's.  She was given 1L IVF without any improvement.  She had minimal UOP in old foley but after this was replaced, she had some UOP after roughly 45 minutes.  UA is pending.  CXR clear.  CVL was placed in ED and PCCM was called for admission.  Of note had recent admission to [facility] where she had diverting colostomy performed to allow sacral decub to heal. 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    SUBJECTIVE:   NAD  VITAL SIGNS: BP 115/73   Pulse (!) 104   Temp 99 F (37.2 C) (Core (Comment))   Resp 20   Wt 101 lb 9.6 oz (46.1 kg)   SpO2 100%   BMI 18.00 kg/m   HEMODYNAMICS: CVP:  [0 mmHg-5 mmHg] 3 mmHg  VENTILATOR SETTINGS:    INTAKE / OUTPUT: I/O last 3 completed shifts: In: 3793.2 [I.V.:2263.2; Other:1130; IV Piggyback:400] Out: 335 [Urine:335]  PHYSICAL EXAMINATION: General:   female, frail, debilitated. Neuro:  L sided hemiparesis.  Opens eyes to names, tracks intermittently.  Follows commands HEENT:  MM very  dry, crusting to lips.  No JVD. Cardiovascular:  Tachy, regular, no M/R/G. Lungs:  Clear bilaterally, resps even and unlabored. Abdomen:  Soft, PEG C/D/I, right colostomy that is leaking malodorous liquid stool. Musculoskeletal:  L AKA (upper thigh). No edema. Skin:  Warm, dry.   See sacral wound photos below.         LABS:  BMET  Recent Labs Lab 05/16/16 1233 05/17/16 0352  NA 151* 152*  K 4.8 3.3*  CL 121* 127*  CO2 19* 21*  BUN 58* 39*  CREATININE 0.58 <0.30*  GLUCOSE 128* 92    Electrolytes  Recent Labs Lab 05/16/16 1233 05/17/16 0352  CALCIUM 8.6* 7.9*  MG  --  2.0  PHOS  --  2.7    CBC  Recent Labs Lab 05/16/16 1233 05/17/16 0352  WBC 16.1* 16.1*  HGB 10.7* 7.1*  HCT 37.8 25.1*  PLT 478* 382    Coag's No results for input(s): APTT, INR in the last 168 hours.  Sepsis Markers  Recent Labs Lab 05/16/16 1254 05/16/16 1713 05/16/16 1943 05/17/16 0352  LATICACIDVEN 4.88* 1.0 1.1  --   PROCALCITON  --  5.22  --  5.48    ABG  Recent Labs Lab 05/16/16 1253 05/16/16 1837  PHART 7.445 7.385  PCO2ART 29.0* 30.8*  PO2ART 94.0 84.0    Liver Enzymes  Recent Labs Lab 05/16/16 1233  AST 46*  ALT 31  ALKPHOS 80  BILITOT 0.7  ALBUMIN 1.9*    Cardiac Enzymes  Recent Labs Lab 05/16/16 1713 05/16/16 2152 05/17/16 0352  TROPONINI 0.05* 0.06* 0.05*    Glucose  Recent Labs Lab 05/16/16 1938 05/16/16 2329 05/17/16 0351 05/17/16 0821  GLUCAP 105* 76 89 89    Imaging Ct Head Wo Contrast  Result Date: 05/16/2016 CLINICAL DATA:  Fever. EXAM: CT HEAD WITHOUT CONTRAST TECHNIQUE: Contiguous axial images were obtained from the base of the skull through the vertex without intravenous contrast. COMPARISON:  None. FINDINGS: Brain: Evidence of a prior large right-sided hemispheric infarction with extensive encephalomalacia and ex vacuo dilatation of the right lateral ventricle. Large areas of calcification likely due to calcified  hematoma. No CT findings for new/acute process. No definite findings for a left-sided infarct. No extra-axial fluid collections. The brainstem and cerebellum are grossly normal. Vascular: Vascular calcifications without obvious aneurysm. Skull: No skull fracture for bone lesion. Sinuses/Orbits: The paranasal sinuses and mastoid air cells are grossly clear. The globes are intact. Other: No scalp hematoma or other soft tissue abnormality is identified. IMPRESSION: 1. Remote large right hemispheric infarction with extensive encephalomalacia and dystrophic calcifications. 2. No definite acute intracranial process. Electronically Signed   By: Rudie Meyer M.D.   On: 05/16/2016 17:27   Dg Chest Port 1 View  Result Date: 05/16/2016 CLINICAL DATA:  LEFT-sided central line placement. EXAM: PORTABLE CHEST 1 VIEW COMPARISON:  Chest radiograph May 16, 2016 at 1303 hours FINDINGS: Interval placement of LEFT subclavian central venous catheter with distal tip projecting in mid superior vena caval. No pneumothorax. Cardiac silhouette is mildly enlarged, mediastinal silhouette is nonsuspicious, mildly calcified aortic arch. Patchy bibasilar airspace opacities. No pleural effusion or focal consolidation. Old RIGHT rib fractures. Single thoracolumbar Harrington rods. Coarse symmetric calcifications in the neck are likely vascular. IMPRESSION: New LEFT subclavian central venous catheter with distal tip projecting in mid superior vena cava. No pneumothorax. Mild cardiomegaly.  Bibasilar atelectasis, less likely pneumonia. Electronically Signed   By: Awilda Metro M.D.   On: 05/16/2016 16:10   Dg Chest Portable 1 View  Result Date: 05/16/2016 CLINICAL DATA:  55 year old presenting with acute mental status changes. Personal history of stroke in seizures. Current history of hypertension and sacral decubitus ulcer. EXAM: PORTABLE CHEST 1 VIEW COMPARISON:  None. FINDINGS: Cardiac silhouette upper normal in size for AP  portable technique. Lungs clear. Pulmonary vascularity normal. No visible pleural effusions. Old healed right rib fractures. Harrington rod traverses the thoracic and upper lumbar spine. IMPRESSION: No acute cardiopulmonary disease. Electronically Signed   By: Hulan Saas M.D.   On: 05/16/2016 13:17     STUDIES:  CXR 8/31 > negative.  CULTURES: Blood 8/31 > Urine 8/31 > Sputum 8/31 > Sacral decub 8/31 >  ANTIBIOTICS: Vanc 8/31 > Zosyn 8/31 >  SIGNIFICANT EVENTS: 8/31 > admitted with sepsis of unclear etiology, developing shock (favor septic as well as hypovolemic).  Likely sources include sacral decub, LLE wound, C.diff, PICC line infxn.  LINES/TUBES: L [place] CVL 8/31 > Old MIdline?  DISCUSSION: 55 y.o. F from nursing home due to remote CVA, brought to ED with fever to 105F.  Sacral decub dressings found to be saturated in ED.  Found to have sepsis vs developing shock (favor septic as well as hypovolemic), likely sources include sacral decub, LLE wound, C.diff, PICC line infxn.  ASSESSMENT /  PLAN:  INFECTIOUS A: Shock - combo of septic + hypovolemic.  Sources include uti v wound v bacteremia/line infection (RUE midline) v CDiff v intra-abd source  Presumed UTI. Multiple wounds including large sacral decub open to bone. P:   Broad spectrum abx as above.  Pan culture.  Trend pct, lactate.  Check CDiff -- long course abx over last few weeks for wound infection, watery stool from ostomy, high fever, leukocytosis. ?MRI of sacrum for ?osteo.  CARDIOVASCULAR A:  Shock - favor septic as well as hypovolemic. Hx HTN. P:  Hold home anti-HTN.  Continue volume resuscitation - needs more fluid.  Pressors if needed to maintain MAP >65. CVP q4hrs. 9/1 0930 cvp 3 Assess cortisol. Pending>> Trend lactate, troponin.  PULMONARY A: Tachypnea. Respiratory compensation for metabolic acidosis.  No obvious infiltrate on CXR.  P:   Supplemental O2 as needed to keep sats >92. Low  threshold for intubation if mental status does not improve. F/u CXR.  Pulmonary hygiene.  F/u ABG.  RENAL Lab Results  Component Value Date   CREATININE <0.30 (L) 05/17/2016   CREATININE 0.58 05/16/2016   CREATININE 0.2 (A) 04/08/2016    Recent Labs Lab 05/16/16 1233 05/17/16 0352  NA 151* 152*    A: Hypernatremia. Metabolic acidosis - nonAG. P:   Volume resuscitation as above.  F/u lactate.  F/u chem.   Replace old foley catheter.  GASTROINTESTINAL A: Dysphagia - s/p PEG but eats per family.  Previous colostomy - diverting in setting large sacral decub.  Diarrhea -- extremely watery colostomy outpt, ? C.diff with recent abx exposure due to gangrenous LLE with recent AKA. P:   NPO for now.  Pepcid.   HEMATOLOGIC  Recent Labs  05/16/16 1233 05/17/16 0352  HGB 10.7* 7.1*    A: Leukocytosis.   anemia with drop in hgb most likely dilutional. P:  F/u CBC.  SQ heparin.   ENDOCRINE CBG (last 3)   Recent Labs  05/16/16 2329 05/17/16 0351 05/17/16 0821  GLUCAP 76 89 89     A: Hyperglycemia   - no known hx DM. P:   SSI.   NEUROLOGIC A: Hx severe CVA with residual left hemiparesis.  ? New right facial droop. CT head. No new findings 8/31 P:   Consider neuro input - hold off for now.  Avoid sedation.  Poor functional status at baseline - was "improving" per family now able to feed herself intermittently, wheelchair bound.   FAMILY  - Updates: Family updated at bedside.  They would like to continue with FULL CODE status for now.  They would not want any long term heroic measures or to prolong non-reversible injury, etc. Daughter updated at bedside 9/1 per S. Minor ACNP  - Inter-disciplinary family meet or Palliative Care meeting due by:  05/22/16.  Brett CanalesSteve Minor ACNP [facility] Pager [phone] till 3 pm If no answer page [phone] 05/17/2016, 9:24 AM   STAFF NOTE: I, Rory Percyaniel Feinstein, MD FACP have personally reviewed patient's available  data, including medical history, events of note, physical examination and test results as part of my evaluation. I have discussed with resident/NP and other care providers such as pharmacist, RN and RRT. In addition, I personally evaluated patient and elicited key findings of: awake, no distress, wound pictures above, wound care called, no pressors, combo - severe dehydration / hypovolemia and sepsis, received 30 cc/kg, alctic resolved, would likely source, appears dry now, feces contamination on wound, follow cultures, maintain vanc, zosyn,even if she  remains culture neg, wold favor empiric zosyn, dc midline, not fxn, old?, source?, want to dc cvl asap, likely in am , cvp 4, avoid saline as no pressors and hypernatremia  - change fluids to 1/2 NS, need to discuss code status and goals, k supp, follow bmet, anemia - diltuion, re assess hct in am, get coags in case needs debridement in future To floor, traid   Mcarthur Rossetti. Tyson Alias, MD, FACP Pgr: [phone] [facility] 05/17/2016 10:17 AM  I spoke to daughter about dnr and no heroics, she will discuss with siblings. We absolutely should NOT put her through acls heroics of any ckind, would be futile and medically ineffective,. Will call pall to work on this more.  Mcarthur Rossetti. Tyson Alias, MD, FACP Pgr: [phone] [facility]

## 2016-05-17 NOTE — NC FL2 (Signed)
[place] MEDICAID FL2 LEVEL OF CARE SCREENING TOOL     IDENTIFICATION  Patient Name: Katherine AhmadiDiane Palmer Birthdate: 03-04-1961 Sex: female Admission Date (Current Location): 05/16/2016  [facility] [place]   Facility and Address:  [facility], [address], [place], [place] [address]      Provider Number: [id number]  Attending Physician Name and Address:  Kalman ShanMurali Ramaswamy, MD  Relative Name and Phone Number:  Mariam DollarKatawbba Halperin (Daughter) [phone]; Delane GingerKeticla Rhinehart (Daughter) [phone]    Current Level of Care: Hospital Recommended Level of Care: Skilled Nursing Facility Prior Approval Number:    Date Approved/Denied:   PASRR Number: [id number]  Discharge Plan: SNF    Current Diagnoses: Patient Active Problem List   Diagnosis Date Noted  . Pressure ulcer 05/17/2016  . Acute respiratory distress (HCC) 05/16/2016  . Altered mental status 05/16/2016  . Septic shock (HCC) 05/16/2016  . Severe sepsis (HCC) 05/16/2016  . Lactic acidosis 05/16/2016  . Encounter for central line placement   . Hypovolemic shock (HCC)   . Hypernatremia   . Acute encephalopathy   . Cerebral infarction due to unspecified mechanism   . Hx of AKA (above knee amputation) (HCC)   . Sepsis (HCC) 05/14/2016  . Leukocytosis 05/13/2016  . CVA (cerebral vascular accident) (HCC) 04/11/2016  . Hemiparesis affecting left side as late effect of cerebrovascular accident (CVA) (HCC) 04/11/2016  . Essential hypertension, benign 04/11/2016  . Sacral decubitus ulcer 04/11/2016  . Failure to thrive syndrome, adult 04/11/2016  . Dysphagia due to old cerebrovascular accident 04/11/2016  . Depression with anxiety 04/11/2016  . Colostomy present (HCC) 04/11/2016  . S/P AKA (above knee amputation) (HCC) 04/11/2016  . Anemia of chronic disease 04/11/2016  . Smoker 04/11/2016    Orientation RESPIRATION BLADDER Height & Weight     Self  Normal Indwelling catheter,  Continent Weight: 101 lb 9.6 oz (46.1 kg) Height:     BEHAVIORAL SYMPTOMS/MOOD NEUROLOGICAL BOWEL NUTRITION STATUS   (NONE) Convulsions/Seizures Colostomy Feeding tube  AMBULATORY STATUS COMMUNICATION OF NEEDS Skin   Extensive Assist Verbally PU Stage and Appropriate Care, Other (Comment) (Stage 4 mid back pressure injury; ABD dressing-change daily)   PU Stage 2 Dressing:  (breast pressure injury; Foam Dressing every 3 days)   PU Stage 4 Dressing: BID (Sacrum pressure injury; Moist to Dry ABD dressing)               Personal Care Assistance Level of Assistance  Bathing, Feeding, Dressing Bathing Assistance: Maximum assistance Feeding assistance:  (Pt. has PEG) Dressing Assistance: Maximum assistance     Functional Limitations Info  Sight, Speech, Hearing Sight Info: Adequate Hearing Info: Adequate Speech Info: Adequate    SPECIAL CARE FACTORS FREQUENCY  PT (By licensed PT)                    Contractures Contractures Info: Not present    Additional Factors Info  Code Status, Allergies, Psychotropic, Insulin Sliding Scale, Isolation Precautions Code Status Info: FULL Allergies Info: No Known Allergies Psychotropic Info: Wellbutrin Insulin Sliding Scale Info: Novolog 0-9 units every 4 hrs Isolation Precautions Info: Enteric precautions (UV disinfection)     Current Medications (05/17/2016):  This is the current hospital active medication list Current Facility-Administered Medications  Medication Dose Route Frequency Provider Last Rate Last Dose  . 0.45 % sodium chloride infusion   Intravenous Continuous Nelda Bucksaniel J Feinstein, MD 125 mL/hr at 05/17/16 1033    . aspirin chewable  tablet 81 mg  81 mg Per Tube Daily Rahul P Desai, PA-C   81 mg at 05/17/16 1040  . buPROPion [facility]) tablet 75 mg  75 mg Per Tube BID Nelda Bucks, MD   75 mg at 05/17/16 1113  . chlorhexidine (PERIDEX) 0.12 % solution 15 mL  15 mL Mouth Rinse BID Shane Crutch, MD   15 mL at  05/17/16 1040  . famotidine (PEPCID) 40 MG/5ML suspension 20 mg  20 mg Per Tube Daily Rahul P Desai, PA-C   20 mg at 05/17/16 1034  . heparin injection 5,000 Units  5,000 Units Subcutaneous Q8H Rahul P Desai, PA-C   5,000 Units at 05/17/16 0436  . insulin aspart (novoLOG) injection 0-9 Units  0-9 Units Subcutaneous Q4H Rahul P Desai, PA-C      . ipratropium-albuterol (DUONEB) 0.5-2.5 (3) MG/3ML nebulizer solution 3 mL  3 mL Nebulization Q6H Rahul P Desai, PA-C   3 mL at 05/17/16 0805  . MEDLINE mouth rinse  15 mL Mouth Rinse q12n4p Shane Crutch, MD   15 mL at 05/17/16 1113  . piperacillin-tazobactam (ZOSYN) IVPB 3.375 g  3.375 g Intravenous Q8H Lauren D Bajbus, RPH   3.375 g at 05/17/16 0437  . sodium hypochlorite (DAKIN'S 1/4 STRENGTH) topical solution   Irrigation BID Kalman Shan, MD      . Valproate Sodium (DEPAKENE) solution 200 mg  200 mg Per Tube Q8H Rahul P Desai, PA-C   200 mg at 05/17/16 0500     Discharge Medications: Please see discharge summary for a list of discharge medications.  Relevant Imaging Results:  Relevant Lab Results:   Additional Information SS #[id number]  Rockwell [place], LCSW

## 2016-05-17 NOTE — Progress Notes (Signed)
eLink Physician-Brief Progress Note Patient Name: Katherine AhmadiDiane Palmer DOB: June 24, 1961 MRN: [id number]   Date of Service  05/17/2016  HPI/Events of Note  hypokalemia  eICU Interventions  Potassium replaced     Intervention Category Intermediate Interventions: Electrolyte abnormality - evaluation and management  DETERDING,ELIZABETH 05/17/2016, 5:56 AM

## 2016-05-17 NOTE — Progress Notes (Signed)
Report was given to upcoming nurse on [phone]

## 2016-05-18 DIAGNOSIS — Z515 Encounter for palliative care: Secondary | ICD-10-CM

## 2016-05-18 DIAGNOSIS — J9601 Acute respiratory failure with hypoxia: Secondary | ICD-10-CM

## 2016-05-18 DIAGNOSIS — L8994 Pressure ulcer of unspecified site, stage 4: Secondary | ICD-10-CM | POA: Diagnosis present

## 2016-05-18 DIAGNOSIS — A4181 Sepsis due to Enterococcus: Principal | ICD-10-CM

## 2016-05-18 DIAGNOSIS — Z89619 Acquired absence of unspecified leg above knee: Secondary | ICD-10-CM

## 2016-05-18 DIAGNOSIS — G9340 Encephalopathy, unspecified: Secondary | ICD-10-CM

## 2016-05-18 DIAGNOSIS — J9600 Acute respiratory failure, unspecified whether with hypoxia or hypercapnia: Secondary | ICD-10-CM

## 2016-05-18 LAB — BASIC METABOLIC PANEL
ANION GAP: 5 (ref 5–15)
ANION GAP: 4 — AB (ref 5–15)
BUN: 16 mg/dL (ref 6–20)
BUN: 15 mg/dL (ref 6–20)
CALCIUM: 8.7 mg/dL — AB (ref 8.9–10.3)
CO2: 20 mmol/L — ABNORMAL LOW (ref 22–32)
CO2: 19 mmol/L — ABNORMAL LOW (ref 22–32)
Calcium: 8.6 mg/dL — ABNORMAL LOW (ref 8.9–10.3)
Chloride: 119 mmol/L — ABNORMAL HIGH (ref 101–111)
Chloride: 118 mmol/L — ABNORMAL HIGH (ref 101–111)
Creatinine, Ser: <0.3 mg/dL — ABNORMAL LOW (ref 0.44–1.00)
Creatinine, Ser: <0.3 mg/dL — ABNORMAL LOW (ref 0.44–1.00)
GLUCOSE: 79 mg/dL (ref 65–99)
Glucose, Bld: 88 mg/dL (ref 65–99)
POTASSIUM: 3.7 mmol/L (ref 3.5–5.1)
POTASSIUM: 3.6 mmol/L (ref 3.5–5.1)
SODIUM: 141 mmol/L (ref 135–145)
Sodium: 144 mmol/L (ref 135–145)

## 2016-05-18 LAB — GLUCOSE, CAPILLARY
GLUCOSE-CAPILLARY: 77 mg/dL (ref 65–99)
GLUCOSE-CAPILLARY: 63 mg/dL — AB (ref 65–99)
GLUCOSE-CAPILLARY: 66 mg/dL (ref 65–99)
GLUCOSE-CAPILLARY: 109 mg/dL — AB (ref 65–99)
Glucose-Capillary: 81 mg/dL (ref 65–99)
Glucose-Capillary: 80 mg/dL (ref 65–99)
Glucose-Capillary: 98 mg/dL (ref 65–99)

## 2016-05-18 LAB — URINE CULTURE

## 2016-05-18 LAB — HEMOGLOBIN A1C
HEMOGLOBIN A1C: 5.1 % (ref 4.8–5.6)
Mean Plasma Glucose: 100 mg/dL

## 2016-05-18 LAB — PROTIME-INR
INR: 1.31
PROTHROMBIN TIME: 16.4 s — AB (ref 11.4–15.2)

## 2016-05-18 LAB — PROCALCITONIN: Procalcitonin: 3.1 ng/mL

## 2016-05-18 MED ORDER — SODIUM CHLORIDE 0.9% FLUSH
10.0000 mL | INTRAVENOUS | Status: DC | PRN
  Administered 2016-05-19 – 2016-05-20 (×3): 20 mL
  Filled 2016-05-18 (×3): qty 40

## 2016-05-18 MED ORDER — FAMOTIDINE 20 MG PO TABS
20.0000 mg | ORAL_TABLET | Freq: Every day | ORAL | Status: DC
  Administered 2016-05-18 – 2016-05-21 (×4): 20 mg via ORAL
  Filled 2016-05-18 (×4): qty 1

## 2016-05-18 MED ORDER — ACETAMINOPHEN 325 MG PO TABS
650.0000 mg | ORAL_TABLET | Freq: Four times a day (QID) | ORAL | Status: DC | PRN
  Administered 2016-05-18: 650 mg via ORAL
  Filled 2016-05-18: qty 2

## 2016-05-18 MED ORDER — VALPROATE SODIUM 250 MG/5ML PO SOLN
200.0000 mg | Freq: Three times a day (TID) | ORAL | Status: DC
  Administered 2016-05-18 – 2016-05-21 (×9): 200 mg via ORAL
  Filled 2016-05-18 (×10): qty 5

## 2016-05-18 MED ORDER — OXYCODONE HCL 5 MG PO TABS
5.0000 mg | ORAL_TABLET | Freq: Four times a day (QID) | ORAL | Status: DC | PRN
  Administered 2016-05-19 – 2016-05-20 (×3): 5 mg via ORAL
  Filled 2016-05-18 (×4): qty 1

## 2016-05-18 NOTE — Progress Notes (Signed)
Hypoglycemic Event  CBG: 63  Treatment: 15 GM carbohydrate snack  Symptoms: None  Follow-up CBG: Time:1242 CBG Result:66  Possible Reasons for Event: Inadequate meal intake  Comments/MD notified:Zamora to restart tube feeds    Harlon FlorWhitaker, [employer] SouthernLonnie Ray

## 2016-05-18 NOTE — Progress Notes (Signed)
[facility]   Reason for visit: Follow up on Enterococcal bacteremia  Interval History: 1/4 blood cultures positive, sensitivities pending; now in 5W out of ICU, no new complaints  Physical Exam: Constitutional:  Vitals:   05/18/16 0200 05/18/16 0455  BP:  125/70  Pulse: 92 88  Resp: 20 19  Temp:  98.1 F (36.7 C)   patient appears in NAD Respiratory: Normal respiratory effort; CTA B Cardiovascular: RRR GI: soft, nt  Review of Systems: Constitutional: negative for fevers and chills Gastrointestinal: negative for diarrhea  Lab Results  Component Value Date   WBC 16.1 (H) 05/17/2016   HGB 7.1 (L) 05/17/2016   HCT 25.1 (L) 05/17/2016   MCV 98.8 05/17/2016   PLT 382 05/17/2016    Lab Results  Component Value Date   CREATININE <0.30 (L) 05/18/2016   BUN 16 05/18/2016   NA 144 05/18/2016   K 3.7 05/18/2016   CL 119 (H) 05/18/2016   CO2 20 (L) 05/18/2016    Lab Results  Component Value Date   ALT 31 05/16/2016   AST 46 (H) 05/16/2016   ALKPHOS 80 05/16/2016     Microbiology: Recent Results (from the past 240 hour(s))  Culture, blood (Routine x 2)     Status: None (Preliminary result)   Collection Time: 05/16/16 12:33 PM  Result Value Ref Range Status   Specimen Description BLOOD LEFT FOREARM  Final   Special Requests IN PEDIATRIC BOTTLE 1CC  Final   Culture NO GROWTH < 24 HOURS  Final   Report Status PENDING  Incomplete  Culture, blood (Routine x 2)     Status: None (Preliminary result)   Collection Time: 05/16/16  2:05 PM  Result Value Ref Range Status   Specimen Description BLOOD RIGHT FOREARM  Final   Special Requests BOTTLES DRAWN AEROBIC AND ANAEROBIC 10CC  Final   Culture NO GROWTH < 24 HOURS  Final   Report Status PENDING  Incomplete  C difficile quick scan w PCR reflex     Status: None   Collection Time: 05/16/16  4:51 PM  Result Value Ref Range Status   C Diff antigen NEGATIVE NEGATIVE Final   C Diff toxin NEGATIVE  NEGATIVE Final   C Diff interpretation No C. difficile detected.  Final  Urine culture     Status: Abnormal   Collection Time: 05/16/16  4:52 PM  Result Value Ref Range Status   Specimen Description URINE, RANDOM  Final   Special Requests NONE  Final   Culture (A)  Final    20,000 COLONIES/mL ESCHERICHIA COLI Confirmed Extended Spectrum Beta-Lactamase Producer (ESBL)    Report Status 05/18/2016 FINAL  Final   Organism ID, Bacteria ESCHERICHIA COLI (A)  Final      Susceptibility   Escherichia coli - MIC*    AMPICILLIN >=32 RESISTANT Resistant     CEFAZOLIN >=64 RESISTANT Resistant     CEFTRIAXONE >=64 RESISTANT Resistant     CIPROFLOXACIN >=4 RESISTANT Resistant     GENTAMICIN <=1 SENSITIVE Sensitive     IMIPENEM <=0.25 SENSITIVE Sensitive     NITROFURANTOIN <=16 SENSITIVE Sensitive     TRIMETH/SULFA <=20 SENSITIVE Sensitive     AMPICILLIN/SULBACTAM >=32 RESISTANT Resistant     PIP/TAZO 8 SENSITIVE Sensitive     Extended ESBL POSITIVE Resistant     * 20,000 COLONIES/mL ESCHERICHIA COLI  Culture, blood (Routine X 2) w Reflex to ID Panel     Status: None (Preliminary result)  Collection Time: 05/16/16  5:13 PM  Result Value Ref Range Status   Specimen Description BLOOD A-LINE  Final   Special Requests BOTTLES DRAWN AEROBIC AND ANAEROBIC 5CC   Final   Culture NO GROWTH < 24 HOURS  Final   Report Status PENDING  Incomplete  Culture, blood (Routine X 2) w Reflex to ID Panel     Status: Abnormal (Preliminary result)   Collection Time: 05/16/16  5:21 PM  Result Value Ref Range Status   Specimen Description BLOOD BLOOD LEFT FOREARM  Final   Special Requests IN PEDIATRIC BOTTLE 2CC  Final   Culture  Setup Time   Final    GRAM POSITIVE COCCI IN PAIRS AND CHAINS AEROBIC BOTTLE ONLY CRITICAL RESULT CALLED TO, READ BACK BY AND VERIFIED WITH: J FRENS,PHARMD AT 1030 05/17/16 BY L BENFIELD    Culture ENTEROCOCCUS SPECIES (A)  Final   Report Status PENDING  Incomplete  Blood Culture ID  Panel (Reflexed)     Status: Abnormal   Collection Time: 05/16/16  5:21 PM  Result Value Ref Range Status   Enterococcus species DETECTED (A) NOT DETECTED Final    Comment: CRITICAL RESULT CALLED TO, READ BACK BY AND VERIFIED WITH: J FRENS,PHARMD AT 1030 05/17/16 BY L BENFIELD    Vancomycin resistance NOT DETECTED NOT DETECTED Final   Listeria monocytogenes NOT DETECTED NOT DETECTED Final   Staphylococcus species NOT DETECTED NOT DETECTED Final   Staphylococcus aureus NOT DETECTED NOT DETECTED Final   Streptococcus species NOT DETECTED NOT DETECTED Final   Streptococcus agalactiae NOT DETECTED NOT DETECTED Final   Streptococcus pneumoniae NOT DETECTED NOT DETECTED Final   Streptococcus pyogenes NOT DETECTED NOT DETECTED Final   Acinetobacter baumannii NOT DETECTED NOT DETECTED Final   Enterobacteriaceae species NOT DETECTED NOT DETECTED Final   Enterobacter cloacae complex NOT DETECTED NOT DETECTED Final   Escherichia coli NOT DETECTED NOT DETECTED Final   Klebsiella oxytoca NOT DETECTED NOT DETECTED Final   Klebsiella pneumoniae NOT DETECTED NOT DETECTED Final   Proteus species NOT DETECTED NOT DETECTED Final   Serratia marcescens NOT DETECTED NOT DETECTED Final   Haemophilus influenzae NOT DETECTED NOT DETECTED Final   Neisseria meningitidis NOT DETECTED NOT DETECTED Final   Pseudomonas aeruginosa NOT DETECTED NOT DETECTED Final   Candida albicans NOT DETECTED NOT DETECTED Final   Candida glabrata NOT DETECTED NOT DETECTED Final   Candida krusei NOT DETECTED NOT DETECTED Final   Candida parapsilosis NOT DETECTED NOT DETECTED Final   Candida tropicalis NOT DETECTED NOT DETECTED Final  MRSA PCR Screening     Status: None   Collection Time: 05/16/16  6:09 PM  Result Value Ref Range Status   MRSA by PCR NEGATIVE NEGATIVE Final    Comment:        The GeneXpert MRSA Assay (FDA approved for NASAL specimens only), is one component of a comprehensive MRSA colonization surveillance  program. It is not intended to diagnose MRSA infection nor to guide or monitor treatment for MRSA infections.     Impression/Plan:  1. Enterococcal bacteremia - no other positive cultures, on vancomycin pending sensitivities.  TTE without vegetation.   2.  Sepsis - improving, likely a result of #1.  No other organisms.  I will d/c zosyn

## 2016-05-18 NOTE — Progress Notes (Signed)
Hypoglycemic Event  CBG: 66  Treatment: 15 GM carbohydrate snack  Symptoms: None  Follow-up CBG: Time: 1317 CBG Result: 80  Possible Reasons for Event: Inadequate meal intake  Comments/MD notified:Zamora to order tube feeds    Harlon FlorWhitaker, [place] [employer]onnie Ray

## 2016-05-18 NOTE — Evaluation (Signed)
Clinical/Bedside Swallow Evaluation Patient Details  Name: Katherine Palmer MRN: [id number] Date of Birth: [birth year]  Today's Date: 05/18/2016 Time: SLP Start Time (ACUTE ONLY): 1506 SLP Stop Time (ACUTE ONLY): 1523 SLP Time Calculation (min) (ACUTE ONLY): 17 min  Past Medical History:  Past Medical History:  Diagnosis Date  . Decubitus ulcer of sacral region, stage 4 (HCC)   . Dysphagia   . Dysphagia as late effect of cerebrovascular disease   . Hyperlipidemia   . Hypertension   . Seizures (HCC)   . Stroke [facility])    Past Surgical History:  Past Surgical History:  Procedure Laterality Date  . 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 COLOSTOMY    . PEG TUBE PLACEMENT     HPI:  Katherine Palmer is a 55 year old with history of stage IV decubitus ulcer, dysphagia status post PEG tube placement, history of CVA, status post left above-the-knee amputation, status post colostomy placement, admitted to the pulmonary critical care service on 05/16/2016. She was found to be in respiratory distress having mental status changes at her skilled nursing facility for which she was transferred to the emergency department. CT of head without acute intracranial abnormality, though remote large right hemispheric infarction noted.    Assessment / Plan / Recommendation Clinical Impression  Pt presents with mild to moderate oropharygneal dysphagia. Pt s/p PEG placement and prior CVA with left hemiplegia and hx of failure to thrive. No prior ST encounters at [facility] for dysphagia prior to current admission. Oral motor exam unremarkable. Pt without overt signs or symptoms of aspiration with any PO this date. Delayed congested cough observed x1 following thin liquids via straw sip. Pt with edentulous oral cavity, limiting funcitonal mastication of solid PO. Recommend continue dysphagia 1 (puree) and thin liquid diet with medicines crushed in puree. ST to follow up for diet tolerance and upgraded PO trials.     Aspiration  Risk  Mild aspiration risk    Diet Recommendation  Dysphagia 1 (puree) thin liquids    Medication Administration: Crushed with puree    Other  Recommendations Oral Care Recommendations: Oral care BID   Follow up Recommendations  Skilled Nursing facility    Frequency and Duration min 1 x/week  1 week       Prognosis Prognosis for Safe Diet Advancement: Fair Barriers to Reach Goals: Severity of deficits      Swallow Study   General Date of Onset: 05/16/16 HPI: Katherine Palmer is a 55 year old with history of stage IV decubitus ulcer, dysphagia status post PEG tube placement, history of CVA, status post left above-the-knee amputation, status post colostomy placement, admitted to the pulmonary critical care service on 05/16/2016. She was found to be in respiratory distress having mental status changes at her skilled nursing facility for which she was transferred to the emergency department. CT of head without acute intracranial abnormality, though remote large right hemispheric infarction noted.  Type of Study: Bedside Swallow Evaluation Previous Swallow Assessment: none on file Diet Prior to this Study: Thin liquids;Dysphagia 1 (puree) Temperature Spikes Noted: Yes Respiratory Status: Room air History of Recent Intubation: No Behavior/Cognition: Alert;Confused Oral Cavity Assessment: Within Functional Limits Oral Cavity - Dentition: Edentulous Self-Feeding Abilities: Needs assist Patient Positioning: Upright in bed Baseline Vocal Quality: Low vocal intensity Volitional Cough: Congested;Weak Volitional Swallow: Able to elicit    Oral/Motor/Sensory Function Overall Oral Motor/Sensory Function: Generalized oral weakness   Ice Chips Ice chips: Within functional limits   Thin Liquid Thin Liquid: Impaired Presentation: Straw;Cup Oral  Phase Functional Implications: Prolonged oral transit Pharyngeal  Phase Impairments: Suspected delayed Swallow;Multiple swallows;Cough - Delayed     Nectar Thick Nectar Thick Liquid: Not tested   Honey Thick Honey Thick Liquid: Not tested   Puree Puree: Within functional limits   Solid   GO   Solid: Impaired Oral Phase Impairments: Impaired mastication;Reduced lingual movement/coordination Oral Phase Functional Implications: Prolonged oral transit Pharyngeal Phase Impairments: Suspected delayed Swallow;Multiple swallows       Marcene Duoshelsea Sumney MA, CCC-SLP Acute Care Speech Language Pathologist    Kennieth RadSumney, Katherine Palmer 05/18/2016,3:49 PM

## 2016-05-18 NOTE — Progress Notes (Signed)
PROGRESS NOTE    Katherine Palmer  GNF:[id number] DOB: Feb 24, 1961 DOA: 05/16/2016 PCP: Kirt Boysarter, Monica, DO   Brief Narrative:  Mrs Katherine Palmer is a 55 year old with history of stage IV decubitus ulcer, dysphagia status post PEG tube placement, history of CVA, status post left above-the-knee amputation, status post colostomy placement, admitted to the pulmonary critical care service on 05/16/2016. She was found to be in respiratory distress having mental status changes at her skilled nursing facility for which she was transferred to the emergency department. On presentation she was septic having a temperature of 105, systolic blood pressures in the 80s, white count of 16,100. Blood cultures growing enterococcus species 1 out of 2. Dr Luciana Axeomer of infectious disease was consulted. She was treated with IV Vancomycin. There was suspicion that source of infectious could be coming from wound although this did not appear to have evidence of active infection. Other potential sources could be GI and urinary tract. Transthoracic echocardiogram performed on 05/17/2016 did not reveal evidence of vegetation. Ejection fraction was 50-55% without wall motion abnormalities.                                 Assessment & Plan:   Active Problems:   S/P AKA (above knee amputation) (HCC)   Septic shock (HCC)   Acute encephalopathy   Severe sepsis (HCC)   Lactic acidosis   Pressure ulcer   Palliative care encounter   Stage IV decubitus ulcer (HCC)   Acute respiratory failure (HCC)  1.  Septic shock -Evidence by patient having positive blood cultures growing enterococcus, temperature of 105, mental status changes, respiratory failure, hypotension, white count of 16,100, present on admission. -Source of infection could be from stage IV decubitus ulcer or perhaps she may have an intra-abdominal source or urinary tract infection. -Plan to continue IV antibiotic therapy with vancomycin -Remains hemodynamically stable now and  showing clinical improvement since presentation.  2.  History of stage IV decubitus ulcer -Patient having poor functional status at baseline, bed-bound having a history of CVA and status post left above-the-knee amputation -Will care consulted -On exam wound appears beefy red, bone is visible, did not appreciate purulence -Continue wound care  3. Acute respiratory failure -At her facility she was found to be in respiratory distress, having labored respirations, mental status changes, likely secondary to sepsis/critifal illness.   4.   History of CVA with residual left-sided hemiparesis -Status post PEG tube placement -Per outpatient notes from [facility] has pleasure foods however relies on PEG tube for nutritional support, no signs of aspiration have been present. -Will consult speech pathology -Restart tube feeds, nutrition consulted.   5.  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 Anemia -Hg trending down to 7.1 however, there is no evidence of GI bleed.   DVT prophylaxis: Heparin 5,000 units TID Code Status: Full Code Family Communication:  Disposition Plan: Will plan for d/c back to SNF when medically stable  Consultants:   PCCM  Procedures:     Antimicrobials:   Vancomycin   Subjective: She complains of right knee pain on this morning's evaluation.   Objective: Vitals:   05/18/16 0200 05/18/16 0455 05/18/16 0500 05/18/16 1319  BP:  125/70  (!) 150/82  Pulse: 92 88  88  Resp: 20 19  (!) 21  Temp:  98.1 F (36.7 C)  97.8 F (36.6 C)  TempSrc:    Oral  SpO2: 100% 96%  100%  Weight:   45.4 kg (100 lb)     Intake/Output Summary (Last 24 hours) at 05/18/16 1426 Last data filed at 05/18/16 0955  Gross per 24 hour  Intake          1969.58 ml  Output             1850 ml  Net           119.58 ml   Filed Weights   05/17/16 1544 05/17/16 2300 05/18/16 0500  Weight: 45.8 kg (101 lb) 46.3 kg (102 lb) 45.4 kg (100 lb)      Examination:  General exam: Appears to be in mild distress, asking to eat something Respiratory system: Clear to auscultation. Respiratory effort normal. Cardiovascular system: S1 & S2 heard, RRR. No JVD, murmurs, rubs, gallops or clicks. No pedal edema. Gastrointestinal system: Abdomen is nondistended, soft and nontender. No organomegaly or masses felt. Normal bowel sounds heard. Central nervous system: Alert and oriented. No focal neurological deficits. Extremities: S/p left above the knee amputation Skin: There is a large stage IV decubitus ulcer, appears beefy red, bone was visible, I did not appreciate purulence, fluctuance or evidence of active infection.  Psychiatry: Judgement and insight appear normal. Mood & affect appropriate.   Data Reviewed: I have personally reviewed following labs and imaging studies  CBC:  Recent Labs Lab 05/16/16 1233 05/17/16 0352  WBC 16.1* 16.1*  NEUTROABS 12.1*  --   HGB 10.7* 7.1*  HCT 37.8 25.1*  MCV 99.7 98.8  PLT 478* 382   Basic Metabolic Panel:  Recent Labs Lab 05/16/16 1233 05/17/16 0352 05/17/16 1856 05/18/16 0800 05/18/16 1200  NA 151* 152* 147* 144 141  K 4.8 3.3* 3.9 3.7 3.6  CL 121* 127* 124* 119* 118*  CO2 19* 21* 19* 20* 19*  GLUCOSE 128* 92 81 79 88  BUN 58* 39* 23* 16 15  CREATININE 0.58 <0.30* <0.30* <0.30* <0.30*  CALCIUM 8.6* 7.9* 8.8* 8.7* 8.6*  MG  --  2.0  --   --   --   PHOS  --  2.7  --   --   --    GFR: CrCl cannot be calculated (This lab value cannot be used to calculate CrCl because it is not a number: <0.30). Liver Function Tests:  Recent Labs Lab 05/16/16 1233  AST 46*  ALT 31  ALKPHOS 80  BILITOT 0.7  PROT 8.2*  ALBUMIN 1.9*   No results for input(s): LIPASE, AMYLASE in the last 168 hours. No results for input(s): AMMONIA in the last 168 hours. Coagulation Profile:  Recent Labs Lab 05/18/16 0800  INR 1.31   Cardiac Enzymes:  Recent Labs Lab 05/16/16 1713 05/16/16 2152  05/17/16 0352  TROPONINI 0.05* 0.06* 0.05*   BNP (last 3 results) No results for input(s): PROBNP in the last 8760 hours. HbA1C:  Recent Labs  05/17/16 0410  HGBA1C 5.1   CBG:  Recent Labs Lab 05/18/16 0333 05/18/16 0750 05/18/16 1217 05/18/16 1242 05/18/16 1317  GLUCAP 81 77 63* 66 80   Lipid Profile: No results for input(s): CHOL, HDL, LDLCALC, TRIG, CHOLHDL, LDLDIRECT in the last 72 hours. Thyroid Function Tests: No results for input(s): TSH, T4TOTAL, FREET4, T3FREE, THYROIDAB in the last 72 hours. Anemia Panel: No results for input(s): VITAMINB12, FOLATE, FERRITIN, TIBC, IRON, RETICCTPCT in the last 72 hours. Sepsis Labs:  Recent Labs Lab 05/16/16 1254 05/16/16 1713 05/16/16 1943 05/17/16 0352 05/18/16 0800  PROCALCITON  --  5.22  --  5.48 3.10  LATICACIDVEN 4.88* 1.0 1.1  --   --     Recent Results (from the past 240 hour(s))  Culture, blood (Routine x 2)     Status: None (Preliminary result)   Collection Time: 05/16/16 12:33 PM  Result Value Ref Range Status   Specimen Description BLOOD LEFT FOREARM  Final   Special Requests IN PEDIATRIC BOTTLE 1CC  Final   Culture NO GROWTH < 24 HOURS  Final   Report Status PENDING  Incomplete  Culture, blood (Routine x 2)     Status: None (Preliminary result)   Collection Time: 05/16/16  2:05 PM  Result Value Ref Range Status   Specimen Description BLOOD RIGHT FOREARM  Final   Special Requests BOTTLES DRAWN AEROBIC AND ANAEROBIC 10CC  Final   Culture NO GROWTH < 24 HOURS  Final   Report Status PENDING  Incomplete  C difficile quick scan w PCR reflex     Status: None   Collection Time: 05/16/16  4:51 PM  Result Value Ref Range Status   C Diff antigen NEGATIVE NEGATIVE Final   C Diff toxin NEGATIVE NEGATIVE Final   C Diff interpretation No C. difficile detected.  Final  Urine culture     Status: Abnormal   Collection Time: 05/16/16  4:52 PM  Result Value Ref Range Status   Specimen Description URINE, RANDOM   Final   Special Requests NONE  Final   Culture (A)  Final    20,000 COLONIES/mL ESCHERICHIA COLI Confirmed Extended Spectrum Beta-Lactamase Producer (ESBL)    Report Status 05/18/2016 FINAL  Final   Organism ID, Bacteria ESCHERICHIA COLI (A)  Final      Susceptibility   Escherichia coli - MIC*    AMPICILLIN >=32 RESISTANT Resistant     CEFAZOLIN >=64 RESISTANT Resistant     CEFTRIAXONE >=64 RESISTANT Resistant     CIPROFLOXACIN >=4 RESISTANT Resistant     GENTAMICIN <=1 SENSITIVE Sensitive     IMIPENEM <=0.25 SENSITIVE Sensitive     NITROFURANTOIN <=16 SENSITIVE Sensitive     TRIMETH/SULFA <=20 SENSITIVE Sensitive     AMPICILLIN/SULBACTAM >=32 RESISTANT Resistant     PIP/TAZO 8 SENSITIVE Sensitive     Extended ESBL POSITIVE Resistant     * 20,000 COLONIES/mL ESCHERICHIA COLI  Culture, blood (Routine X 2) w Reflex to ID Panel     Status: None (Preliminary result)   Collection Time: 05/16/16  5:13 PM  Result Value Ref Range Status   Specimen Description BLOOD A-LINE  Final   Special Requests BOTTLES DRAWN AEROBIC AND ANAEROBIC 5CC   Final   Culture NO GROWTH < 24 HOURS  Final   Report Status PENDING  Incomplete  Culture, blood (Routine X 2) w Reflex to ID Panel     Status: Abnormal (Preliminary result)   Collection Time: 05/16/16  5:21 PM  Result Value Ref Range Status   Specimen Description BLOOD BLOOD LEFT FOREARM  Final   Special Requests IN PEDIATRIC BOTTLE 2CC  Final   Culture  Setup Time   Final    GRAM POSITIVE COCCI IN PAIRS AND CHAINS AEROBIC BOTTLE ONLY CRITICAL RESULT CALLED TO, READ BACK BY AND VERIFIED WITH: J FRENS,PHARMD AT 1030 05/17/16 BY L BENFIELD    Culture ENTEROCOCCUS SPECIES (A)  Final   Report Status PENDING  Incomplete  Blood Culture ID Panel (Reflexed)     Status: Abnormal   Collection Time: 05/16/16  5:21 PM  Result Value Ref  Range Status   Enterococcus species DETECTED (A) NOT DETECTED Final    Comment: CRITICAL RESULT CALLED TO, READ BACK BY AND  VERIFIED WITH: J FRENS,PHARMD AT 1030 05/17/16 BY L BENFIELD    Vancomycin resistance NOT DETECTED NOT DETECTED Final   Listeria monocytogenes NOT DETECTED NOT DETECTED Final   Staphylococcus species NOT DETECTED NOT DETECTED Final   Staphylococcus aureus NOT DETECTED NOT DETECTED Final   Streptococcus species NOT DETECTED NOT DETECTED Final   Streptococcus agalactiae NOT DETECTED NOT DETECTED Final   Streptococcus pneumoniae NOT DETECTED NOT DETECTED Final   Streptococcus pyogenes NOT DETECTED NOT DETECTED Final   Acinetobacter baumannii NOT DETECTED NOT DETECTED Final   Enterobacteriaceae species NOT DETECTED NOT DETECTED Final   Enterobacter cloacae complex NOT DETECTED NOT DETECTED Final   Escherichia coli NOT DETECTED NOT DETECTED Final   Klebsiella oxytoca NOT DETECTED NOT DETECTED Final   Klebsiella pneumoniae NOT DETECTED NOT DETECTED Final   Proteus species NOT DETECTED NOT DETECTED Final   Serratia marcescens NOT DETECTED NOT DETECTED Final   Haemophilus influenzae NOT DETECTED NOT DETECTED Final   Neisseria meningitidis NOT DETECTED NOT DETECTED Final   Pseudomonas aeruginosa NOT DETECTED NOT DETECTED Final   Candida albicans NOT DETECTED NOT DETECTED Final   Candida glabrata NOT DETECTED NOT DETECTED Final   Candida krusei NOT DETECTED NOT DETECTED Final   Candida parapsilosis NOT DETECTED NOT DETECTED Final   Candida tropicalis NOT DETECTED NOT DETECTED Final  MRSA PCR Screening     Status: None   Collection Time: 05/16/16  6:09 PM  Result Value Ref Range Status   MRSA by PCR NEGATIVE NEGATIVE Final    Comment:        The GeneXpert MRSA Assay (FDA approved for NASAL specimens only), is one component of a comprehensive MRSA colonization surveillance program. It is not intended to diagnose MRSA infection nor to guide or monitor treatment for MRSA infections.          Radiology Studies: Ct Head Wo Contrast  Result Date: 05/16/2016 CLINICAL DATA:  Fever.  EXAM: CT HEAD WITHOUT CONTRAST TECHNIQUE: Contiguous axial images were obtained from the base of the skull through the vertex without intravenous contrast. COMPARISON:  None. FINDINGS: Brain: Evidence of a prior large right-sided hemispheric infarction with extensive encephalomalacia and ex vacuo dilatation of the right lateral ventricle. Large areas of calcification likely due to calcified hematoma. No CT findings for new/acute process. No definite findings for a left-sided infarct. No extra-axial fluid collections. The brainstem and cerebellum are grossly normal. Vascular: Vascular calcifications without obvious aneurysm. Skull: No skull fracture for bone lesion. Sinuses/Orbits: The paranasal sinuses and mastoid air cells are grossly clear. The globes are intact. Other: No scalp hematoma or other soft tissue abnormality is identified. IMPRESSION: 1. Remote large right hemispheric infarction with extensive encephalomalacia and dystrophic calcifications. 2. No definite acute intracranial process. Electronically Signed   By: Rudie Meyer M.D.   On: 05/16/2016 17:27   Dg Chest Port 1 View  Result Date: 05/16/2016 CLINICAL DATA:  LEFT-sided central line placement. EXAM: PORTABLE CHEST 1 VIEW COMPARISON:  Chest radiograph May 16, 2016 at 1303 hours FINDINGS: Interval placement of LEFT subclavian central venous catheter with distal tip projecting in mid superior vena caval. No pneumothorax. Cardiac silhouette is mildly enlarged, mediastinal silhouette is nonsuspicious, mildly calcified aortic arch. Patchy bibasilar airspace opacities. No pleural effusion or focal consolidation. Old RIGHT rib fractures. Single thoracolumbar Harrington rods. Coarse symmetric calcifications in the neck are  likely vascular. IMPRESSION: New LEFT subclavian central venous catheter with distal tip projecting in mid superior vena cava. No pneumothorax. Mild cardiomegaly.  Bibasilar atelectasis, less likely pneumonia. Electronically  Signed   By: Awilda Metro M.D.   On: 05/16/2016 16:10     Scheduled Meds: . aspirin  81 mg Per Tube Daily  . buPROPion  75 mg Per Tube BID  . chlorhexidine  15 mL Mouth Rinse BID  . famotidine  20 mg Per Tube Daily  . heparin  5,000 Units Subcutaneous Q8H  . insulin aspart  0-9 Units Subcutaneous Q4H  . ipratropium-albuterol  3 mL Nebulization Q6H  . mouth rinse  15 mL Mouth Rinse q12n4p  . sodium hypochlorite   Irrigation BID  . Valproate Sodium  200 mg Per Tube Q8H  . vancomycin  1,000 mg Intravenous Q24H   Continuous Infusions:    LOS: 2 days    Time spent: 35 min  Jeralyn Bennett, MD [facility] Pager [phone]  If 7PM-7AM, please contact night-coverage [email or web address] Password [email or web address] 05/18/2016, 2:26 PM

## 2016-05-18 NOTE — Progress Notes (Signed)
Palliative care consult received for goals of care discussion. Although patient appears much improved from admission she is still confused. Spoke to her daughter Gaylyn RongKatawba Buehl and plan is to meet 05/19/2016 at 10 AM. Thank you Zack SealSara Keiondre Colee, ANP-ACHPN

## 2016-05-19 DIAGNOSIS — R7881 Bacteremia: Secondary | ICD-10-CM

## 2016-05-19 LAB — GLUCOSE, CAPILLARY
GLUCOSE-CAPILLARY: 132 mg/dL — AB (ref 65–99)
GLUCOSE-CAPILLARY: 88 mg/dL (ref 65–99)
Glucose-Capillary: 113 mg/dL — ABNORMAL HIGH (ref 65–99)
Glucose-Capillary: 114 mg/dL — ABNORMAL HIGH (ref 65–99)
Glucose-Capillary: 138 mg/dL — ABNORMAL HIGH (ref 65–99)
Glucose-Capillary: 73 mg/dL (ref 65–99)
Glucose-Capillary: 93 mg/dL (ref 65–99)

## 2016-05-19 LAB — BASIC METABOLIC PANEL
Anion gap: 5 (ref 5–15)
BUN: 10 mg/dL (ref 6–20)
CHLORIDE: 116 mmol/L — AB (ref 101–111)
CO2: 20 mmol/L — ABNORMAL LOW (ref 22–32)
Calcium: 8.5 mg/dL — ABNORMAL LOW (ref 8.9–10.3)
Creatinine, Ser: <0.3 mg/dL — ABNORMAL LOW (ref 0.44–1.00)
Glucose, Bld: 96 mg/dL (ref 65–99)
POTASSIUM: 3.5 mmol/L (ref 3.5–5.1)
SODIUM: 141 mmol/L (ref 135–145)

## 2016-05-19 LAB — PREPARE RBC (CROSSMATCH)

## 2016-05-19 LAB — CBC
HEMATOCRIT: 24.5 % — AB (ref 36.0–46.0)
Hemoglobin: 7.1 g/dL — ABNORMAL LOW (ref 12.0–15.0)
MCH: 27.5 pg (ref 26.0–34.0)
MCHC: 29 g/dL — ABNORMAL LOW (ref 30.0–36.0)
MCV: 95 fL (ref 78.0–100.0)
Platelets: 362 10*3/uL (ref 150–400)
RBC: 2.58 MIL/uL — AB (ref 3.87–5.11)
RDW: 18.5 % — ABNORMAL HIGH (ref 11.5–15.5)
WBC: 7.7 10*3/uL (ref 4.0–10.5)

## 2016-05-19 LAB — VANCOMYCIN, TROUGH: Vancomycin Tr: 13 ug/mL — ABNORMAL LOW (ref 15–20)

## 2016-05-19 LAB — ABO/RH: ABO/RH(D): O POS

## 2016-05-19 MED ORDER — SODIUM CHLORIDE 0.9 % IV SOLN
2.0000 g | Freq: Four times a day (QID) | INTRAVENOUS | Status: DC
  Administered 2016-05-19 – 2016-05-21 (×8): 2 g via INTRAVENOUS
  Filled 2016-05-19 (×10): qty 2000

## 2016-05-19 MED ORDER — SODIUM CHLORIDE 0.9 % IV SOLN: 1.0000 g | Freq: Four times a day (QID) | INTRAVENOUS | Status: DC

## 2016-05-19 MED ORDER — PRO-STAT SUGAR FREE PO LIQD
30.0000 mL | Freq: Two times a day (BID) | ORAL | Status: DC
  Administered 2016-05-19 – 2016-05-20 (×3): 30 mL via ORAL
  Filled 2016-05-19 (×2): qty 30

## 2016-05-19 MED ORDER — MORPHINE SULFATE (PF) 2 MG/ML IV SOLN
2.0000 mg | Freq: Once | INTRAVENOUS | Status: AC
  Administered 2016-05-19: 2 mg via INTRAVENOUS
  Filled 2016-05-19: qty 1

## 2016-05-19 MED ORDER — SODIUM CHLORIDE 0.9 % IV SOLN
Freq: Once | INTRAVENOUS | Status: AC
  Administered 2016-05-19: 17:00:00 via INTRAVENOUS

## 2016-05-19 NOTE — Progress Notes (Signed)
PROGRESS NOTE    Katherine Palmer  ZOX:[id number] DOB: October 05, 1960 DOA: 05/16/2016 PCP: Kirt Boys, DO   Brief Narrative:  Mrs Schild is a 55 year old with history of stage IV decubitus ulcer, dysphagia status post PEG tube placement, history of CVA, status post left above-the-knee amputation, status post colostomy placement, admitted to the pulmonary critical care service on 05/16/2016. She was found to be in respiratory distress having mental status changes at her skilled nursing facility for which she was transferred to the emergency department. On presentation she was septic having a temperature of 105, systolic blood pressures in the 80s, white count of 16,100. Blood cultures growing enterococcus species 1 out of 2. Dr Luciana Axe of infectious disease was consulted. She was treated with IV Vancomycin. There was suspicion that source of infectious could be coming from wound although this did not appear to have evidence of active infection. Other potential sources could be GI and urinary tract. Transthoracic echocardiogram performed on 05/17/2016 did not reveal evidence of vegetation. Ejection fraction was 50-55% without wall motion abnormalities.                                Assessment & Plan:   Active Problems:   S/P AKA (above knee amputation) (HCC)   Septic shock (HCC)   Acute encephalopathy   Severe sepsis (HCC)   Lactic acidosis   Pressure ulcer   Palliative care encounter   Stage IV decubitus ulcer (HCC)   Acute respiratory failure (HCC)  1.  Septic shock -Evidence by patient having positive blood cultures growing enterococcus, temperature of 105, mental status changes, respiratory failure, hypotension, white count of 16,100, present on admission. -Source of infection could be from stage IV decubitus ulcer or perhaps she may have an intra-abdominal source or urinary tract infection. -Plan to continue IV antibiotic therapy with vancomycin -Remains hemodynamically stable now and  showing clinical improvement since presentation. -On 05/19/2016 microbiology reporting Enterococcus Faecalis organism sensitive to Ampicillin, Gentamicin and Vancomycin. Will stop Vancomycin and narrow antimicrobial spectrum to IV Ampicillin. Case discussed with Dr Luciana Axe of ID. Repeat blood cultures were drawn on 05/19/2016. Plan to place a PICC line in the next 24 hours if the blood cultures remain negative.  2.  History of stage IV decubitus ulcer -Patient having poor functional status at baseline, bed-bound having a history of CVA and status post left above-the-knee amputation -Wound care consulted -On exam wound appears beefy red, bone is visible, did not appreciate purulence -Continue wound care  3. Acute respiratory failure -At her facility she was found to be in respiratory distress, having labored respirations, mental status changes, likely secondary to sepsis/critifal illness.   4.   History of CVA with residual left-sided hemiparesis -Status post PEG tube placement -Per outpatient notes from [facility] has pleasure foods however relies on PEG tube for nutritional support, no signs of aspiration have been present. -SLP consulted, diet advanced to Dysphagia 1 -Restarted Prostat per tube BID  5.  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 Anemia -Hg trending down to 7.1 however, there is no evidence of GI bleed. - Will transfuse 1 unit of PRBC's today  DVT prophylaxis: Heparin 5,000 units TID Code Status: Full Code Family Communication:  Disposition Plan: Will plan for d/c back to SNF when medically stable  Consultants:   PCCM  ID  Procedures:   TTE performed on 05/17/2016 IMPRESSION:  - Left  ventricle: Inferior wall hypokinesis The cavity size was   normal. Systolic function was normal. The estimated ejection   fraction was in the range of 50% to 55%. Wall motion was normal;   there were no regional wall motion abnormalities. Left    ventricular diastolic function parameters were normal. - Mitral valve: There was mild regurgitation. - Atrial septum: No defect or patent foramen ovale was identified.  Antimicrobials:   Vancomycin  Subjective: She complains of right knee pain on this morning's evaluation.   Objective: Vitals:   05/19/16 0241 05/19/16 0300 05/19/16 0624 05/19/16 0716  BP:   132/79   Pulse:   85   Resp:   20   Temp:   98.2 F (36.8 C)   TempSrc:   Oral   SpO2: 100%  98% 95%  Weight:  43.5 kg (96 lb)      Intake/Output Summary (Last 24 hours) at 05/19/16 1245 Last data filed at 05/19/16 1031  Gross per 24 hour  Intake              850 ml  Output             2400 ml  Net            -1550 ml   Filed Weights   05/17/16 2300 05/18/16 0500 05/19/16 0300  Weight: 46.3 kg (102 lb) 45.4 kg (100 lb) 43.5 kg (96 lb)    Examination:  General exam: Appears to be in mild distress, asking to eat something Respiratory system: Clear to auscultation. Respiratory effort normal. Cardiovascular system: S1 & S2 heard, RRR. No JVD, murmurs, rubs, gallops or clicks. No pedal edema. Gastrointestinal system: Abdomen is nondistended, soft and nontender. No organomegaly or masses felt. Normal bowel sounds heard. Central nervous system: Alert and oriented. No focal neurological deficits. Extremities: S/p left above the knee amputation Skin: There is a large stage IV decubitus ulcer, appears beefy red, bone was visible, I did not appreciate purulence, fluctuance or evidence of active infection.  Psychiatry: Judgement and insight appear normal. Mood & affect appropriate.   Data Reviewed: I have personally reviewed following labs and imaging studies  CBC:  Recent Labs Lab 05/16/16 1233 05/17/16 0352 05/19/16 0429  WBC 16.1* 16.1* 7.7  NEUTROABS 12.1*  --   --   HGB 10.7* 7.1* 7.1*  HCT 37.8 25.1* 24.5*  MCV 99.7 98.8 95.0  PLT 478* 382 362   Basic Metabolic Panel:  Recent Labs Lab 05/17/16 0352  05/17/16 1856 05/18/16 0800 05/18/16 1200 05/19/16 0429  NA 152* 147* 144 141 141  K 3.3* 3.9 3.7 3.6 3.5  CL 127* 124* 119* 118* 116*  CO2 21* 19* 20* 19* 20*  GLUCOSE 92 81 79 88 96  BUN 39* 23* 16 15 10   CREATININE <0.30* <0.30* <0.30* <0.30* <0.30*  CALCIUM 7.9* 8.8* 8.7* 8.6* 8.5*  MG 2.0  --   --   --   --   PHOS 2.7  --   --   --   --    GFR: CrCl cannot be calculated (This lab value cannot be used to calculate CrCl because it is not a number: <0.30). Liver Function Tests:  Recent Labs Lab 05/16/16 1233  AST 46*  ALT 31  ALKPHOS 80  BILITOT 0.7  PROT 8.2*  ALBUMIN 1.9*   No results for input(s): LIPASE, AMYLASE in the last 168 hours. No results for input(s): AMMONIA in the last 168 hours. Coagulation Profile:  Recent  Labs Lab 05/18/16 0800  INR 1.31   Cardiac Enzymes:  Recent Labs Lab 05/16/16 1713 05/16/16 2152 05/17/16 0352  TROPONINI 0.05* 0.06* 0.05*   BNP (last 3 results) No results for input(s): PROBNP in the last 8760 hours. HbA1C:  Recent Labs  05/17/16 0410  HGBA1C 5.1   CBG:  Recent Labs Lab 05/18/16 2004 05/19/16 0004 05/19/16 0410 05/19/16 0807 05/19/16 1202  GLUCAP 109* 88 73 93 132*   Lipid Profile: No results for input(s): CHOL, HDL, LDLCALC, TRIG, CHOLHDL, LDLDIRECT in the last 72 hours. Thyroid Function Tests: No results for input(s): TSH, T4TOTAL, FREET4, T3FREE, THYROIDAB in the last 72 hours. Anemia Panel: No results for input(s): VITAMINB12, FOLATE, FERRITIN, TIBC, IRON, RETICCTPCT in the last 72 hours. Sepsis Labs:  Recent Labs Lab 05/16/16 1254 05/16/16 1713 05/16/16 1943 05/17/16 0352 05/18/16 0800  PROCALCITON  --  5.22  --  5.48 3.10  LATICACIDVEN 4.88* 1.0 1.1  --   --     Recent Results (from the past 240 hour(s))  Culture, blood (Routine x 2)     Status: None (Preliminary result)   Collection Time: 05/16/16 12:33 PM  Result Value Ref Range Status   Specimen Description BLOOD LEFT FOREARM   Final   Special Requests IN PEDIATRIC BOTTLE 1CC  Final   Culture NO GROWTH 2 DAYS  Final   Report Status PENDING  Incomplete  Culture, blood (Routine x 2)     Status: None (Preliminary result)   Collection Time: 05/16/16  2:05 PM  Result Value Ref Range Status   Specimen Description BLOOD RIGHT FOREARM  Final   Special Requests BOTTLES DRAWN AEROBIC AND ANAEROBIC 10CC  Final   Culture NO GROWTH 2 DAYS  Final   Report Status PENDING  Incomplete  C difficile quick scan w PCR reflex     Status: None   Collection Time: 05/16/16  4:51 PM  Result Value Ref Range Status   C Diff antigen NEGATIVE NEGATIVE Final   C Diff toxin NEGATIVE NEGATIVE Final   C Diff interpretation No C. difficile detected.  Final  Urine culture     Status: Abnormal   Collection Time: 05/16/16  4:52 PM  Result Value Ref Range Status   Specimen Description URINE, RANDOM  Final   Special Requests NONE  Final   Culture (A)  Final    20,000 COLONIES/mL ESCHERICHIA COLI Confirmed Extended Spectrum Beta-Lactamase Producer (ESBL)    Report Status 05/18/2016 FINAL  Final   Organism ID, Bacteria ESCHERICHIA COLI (A)  Final      Susceptibility   Escherichia coli - MIC*    AMPICILLIN >=32 RESISTANT Resistant     CEFAZOLIN >=64 RESISTANT Resistant     CEFTRIAXONE >=64 RESISTANT Resistant     CIPROFLOXACIN >=4 RESISTANT Resistant     GENTAMICIN <=1 SENSITIVE Sensitive     IMIPENEM <=0.25 SENSITIVE Sensitive     NITROFURANTOIN <=16 SENSITIVE Sensitive     TRIMETH/SULFA <=20 SENSITIVE Sensitive     AMPICILLIN/SULBACTAM >=32 RESISTANT Resistant     PIP/TAZO 8 SENSITIVE Sensitive     Extended ESBL POSITIVE Resistant     * 20,000 COLONIES/mL ESCHERICHIA COLI  Culture, blood (Routine X 2) w Reflex to ID Panel     Status: None (Preliminary result)   Collection Time: 05/16/16  5:13 PM  Result Value Ref Range Status   Specimen Description BLOOD A-LINE  Final   Special Requests BOTTLES DRAWN AEROBIC AND ANAEROBIC 5CC   Final  Culture NO GROWTH 2 DAYS  Final   Report Status PENDING  Incomplete  Culture, blood (Routine X 2) w Reflex to ID Panel     Status: Abnormal (Preliminary result)   Collection Time: 05/16/16  5:21 PM  Result Value Ref Range Status   Specimen Description BLOOD BLOOD LEFT FOREARM  Final   Special Requests IN PEDIATRIC BOTTLE 2CC  Final   Culture  Setup Time   Final    GRAM POSITIVE COCCI IN PAIRS AND CHAINS AEROBIC BOTTLE ONLY CRITICAL RESULT CALLED TO, READ BACK BY AND VERIFIED WITH: J FRENS,PHARMD AT 1030 05/17/16 BY L BENFIELD    Culture ENTEROCOCCUS FAECALIS GRAM POSITIVE COCCI  (A)  Final   Report Status PENDING  Incomplete   Organism ID, Bacteria ENTEROCOCCUS FAECALIS  Final      Susceptibility   Enterococcus faecalis - MIC*    AMPICILLIN <=2 SENSITIVE Sensitive     VANCOMYCIN 2 SENSITIVE Sensitive     GENTAMICIN SYNERGY SENSITIVE Sensitive     * ENTEROCOCCUS FAECALIS  Blood Culture ID Panel (Reflexed)     Status: Abnormal   Collection Time: 05/16/16  5:21 PM  Result Value Ref Range Status   Enterococcus species DETECTED (A) NOT DETECTED Final    Comment: CRITICAL RESULT CALLED TO, READ BACK BY AND VERIFIED WITH: J FRENS,PHARMD AT 1030 05/17/16 BY L BENFIELD    Vancomycin resistance NOT DETECTED NOT DETECTED Final   Listeria monocytogenes NOT DETECTED NOT DETECTED Final   Staphylococcus species NOT DETECTED NOT DETECTED Final   Staphylococcus aureus NOT DETECTED NOT DETECTED Final   Streptococcus species NOT DETECTED NOT DETECTED Final   Streptococcus agalactiae NOT DETECTED NOT DETECTED Final   Streptococcus pneumoniae NOT DETECTED NOT DETECTED Final   Streptococcus pyogenes NOT DETECTED NOT DETECTED Final   Acinetobacter baumannii NOT DETECTED NOT DETECTED Final   Enterobacteriaceae species NOT DETECTED NOT DETECTED Final   Enterobacter cloacae complex NOT DETECTED NOT DETECTED Final   Escherichia coli NOT DETECTED NOT DETECTED Final   Klebsiella oxytoca NOT DETECTED NOT  DETECTED Final   Klebsiella pneumoniae NOT DETECTED NOT DETECTED Final   Proteus species NOT DETECTED NOT DETECTED Final   Serratia marcescens NOT DETECTED NOT DETECTED Final   Haemophilus influenzae NOT DETECTED NOT DETECTED Final   Neisseria meningitidis NOT DETECTED NOT DETECTED Final   Pseudomonas aeruginosa NOT DETECTED NOT DETECTED Final   Candida albicans NOT DETECTED NOT DETECTED Final   Candida glabrata NOT DETECTED NOT DETECTED Final   Candida krusei NOT DETECTED NOT DETECTED Final   Candida parapsilosis NOT DETECTED NOT DETECTED Final   Candida tropicalis NOT DETECTED NOT DETECTED Final  MRSA PCR Screening     Status: None   Collection Time: 05/16/16  6:09 PM  Result Value Ref Range Status   MRSA by PCR NEGATIVE NEGATIVE Final    Comment:        The GeneXpert MRSA Assay (FDA approved for NASAL specimens only), is one component of a comprehensive MRSA colonization surveillance program. It is not intended to diagnose MRSA infection nor to guide or monitor treatment for MRSA infections.          Radiology Studies: No results found.   Scheduled Meds: . sodium chloride   Intravenous Once  . aspirin  81 mg Per Tube Daily  . buPROPion  75 mg Per Tube BID  . chlorhexidine  15 mL Mouth Rinse BID  . famotidine  20 mg Oral Daily  . feeding supplement (PRO-STAT SUGAR  FREE 64)  30 mL Oral BID  . heparin  5,000 Units Subcutaneous Q8H  . insulin aspart  0-9 Units Subcutaneous Q4H  . ipratropium-albuterol  3 mL Nebulization Q6H  . mouth rinse  15 mL Mouth Rinse q12n4p  . sodium hypochlorite   Irrigation BID  . Valproate Sodium  200 mg Oral Q8H  . vancomycin  1,000 mg Intravenous Q24H   Continuous Infusions:    LOS: 3 days    Time spent: 35 min  Jeralyn Bennett, MD [facility] Pager [phone]  If 7PM-7AM, please contact night-coverage [email or web address] Password [email or web address] 05/19/2016, 12:45 PM

## 2016-05-19 NOTE — Progress Notes (Signed)
[facility]   Reason for visit: Follow up on Enterococcal bacteremia  Interval History: 1/4 blood cultures positive, sensitive to ampicillin; Palliative discussion ongoing  Physical Exam: Constitutional:  Vitals:   05/19/16 0624 05/19/16 1301  BP: 132/79 128/88  Pulse: 85 97  Resp: 20 18  Temp: 98.2 F (36.8 C) 98.1 F (36.7 C)   patient appears in NAD; sleeping Respiratory: Normal respiratory effort; CTA B Cardiovascular: RRR GI: soft, nt  Review of Systems: Constitutional: negative for fevers and chills Gastrointestinal: negative for diarrhea  Lab Results  Component Value Date   WBC 7.7 05/19/2016   HGB 7.1 (L) 05/19/2016   HCT 24.5 (L) 05/19/2016   MCV 95.0 05/19/2016   PLT 362 05/19/2016    Lab Results  Component Value Date   CREATININE <0.30 (L) 05/19/2016   BUN 10 05/19/2016   NA 141 05/19/2016   K 3.5 05/19/2016   CL 116 (H) 05/19/2016   CO2 20 (L) 05/19/2016    Lab Results  Component Value Date   ALT 31 05/16/2016   AST 46 (H) 05/16/2016   ALKPHOS 80 05/16/2016     Microbiology: Recent Results (from the past 240 hour(s))  Culture, blood (Routine x 2)     Status: None (Preliminary result)   Collection Time: 05/16/16 12:33 PM  Result Value Ref Range Status   Specimen Description BLOOD LEFT FOREARM  Final   Special Requests IN PEDIATRIC BOTTLE 1CC  Final   Culture NO GROWTH 2 DAYS  Final   Report Status PENDING  Incomplete  Culture, blood (Routine x 2)     Status: None (Preliminary result)   Collection Time: 05/16/16  2:05 PM  Result Value Ref Range Status   Specimen Description BLOOD RIGHT FOREARM  Final   Special Requests BOTTLES DRAWN AEROBIC AND ANAEROBIC 10CC  Final   Culture NO GROWTH 2 DAYS  Final   Report Status PENDING  Incomplete  C difficile quick scan w PCR reflex     Status: None   Collection Time: 05/16/16  4:51 PM  Result Value Ref Range Status   C Diff antigen NEGATIVE NEGATIVE Final   C Diff  toxin NEGATIVE NEGATIVE Final   C Diff interpretation No C. difficile detected.  Final  Urine culture     Status: Abnormal   Collection Time: 05/16/16  4:52 PM  Result Value Ref Range Status   Specimen Description URINE, RANDOM  Final   Special Requests NONE  Final   Culture (A)  Final    20,000 COLONIES/mL ESCHERICHIA COLI Confirmed Extended Spectrum Beta-Lactamase Producer (ESBL)    Report Status 05/18/2016 FINAL  Final   Organism ID, Bacteria ESCHERICHIA COLI (A)  Final      Susceptibility   Escherichia coli - MIC*    AMPICILLIN >=32 RESISTANT Resistant     CEFAZOLIN >=64 RESISTANT Resistant     CEFTRIAXONE >=64 RESISTANT Resistant     CIPROFLOXACIN >=4 RESISTANT Resistant     GENTAMICIN <=1 SENSITIVE Sensitive     IMIPENEM <=0.25 SENSITIVE Sensitive     NITROFURANTOIN <=16 SENSITIVE Sensitive     TRIMETH/SULFA <=20 SENSITIVE Sensitive     AMPICILLIN/SULBACTAM >=32 RESISTANT Resistant     PIP/TAZO 8 SENSITIVE Sensitive     Extended ESBL POSITIVE Resistant     * 20,000 COLONIES/mL ESCHERICHIA COLI  Culture, blood (Routine X 2) w Reflex to ID Panel     Status: None (Preliminary result)   Collection Time: 05/16/16  5:13 PM  Result Value Ref Range Status   Specimen Description BLOOD A-LINE  Final   Special Requests BOTTLES DRAWN AEROBIC AND ANAEROBIC 5CC   Final   Culture NO GROWTH 2 DAYS  Final   Report Status PENDING  Incomplete  Culture, blood (Routine X 2) w Reflex to ID Panel     Status: Abnormal (Preliminary result)   Collection Time: 05/16/16  5:21 PM  Result Value Ref Range Status   Specimen Description BLOOD BLOOD LEFT FOREARM  Final   Special Requests IN PEDIATRIC BOTTLE 2CC  Final   Culture  Setup Time   Final    GRAM POSITIVE COCCI IN PAIRS AND CHAINS AEROBIC BOTTLE ONLY CRITICAL RESULT CALLED TO, READ BACK BY AND VERIFIED WITH: J FRENS,PHARMD AT 1030 05/17/16 BY L BENFIELD    Culture ENTEROCOCCUS FAECALIS GRAM POSITIVE COCCI  (A)  Final   Report Status PENDING   Incomplete   Organism ID, Bacteria ENTEROCOCCUS FAECALIS  Final      Susceptibility   Enterococcus faecalis - MIC*    AMPICILLIN <=2 SENSITIVE Sensitive     VANCOMYCIN 2 SENSITIVE Sensitive     GENTAMICIN SYNERGY SENSITIVE Sensitive     * ENTEROCOCCUS FAECALIS  Blood Culture ID Panel (Reflexed)     Status: Abnormal   Collection Time: 05/16/16  5:21 PM  Result Value Ref Range Status   Enterococcus species DETECTED (A) NOT DETECTED Final    Comment: CRITICAL RESULT CALLED TO, READ BACK BY AND VERIFIED WITH: J FRENS,PHARMD AT 1030 05/17/16 BY L BENFIELD    Vancomycin resistance NOT DETECTED NOT DETECTED Final   Listeria monocytogenes NOT DETECTED NOT DETECTED Final   Staphylococcus species NOT DETECTED NOT DETECTED Final   Staphylococcus aureus NOT DETECTED NOT DETECTED Final   Streptococcus species NOT DETECTED NOT DETECTED Final   Streptococcus agalactiae NOT DETECTED NOT DETECTED Final   Streptococcus pneumoniae NOT DETECTED NOT DETECTED Final   Streptococcus pyogenes NOT DETECTED NOT DETECTED Final   Acinetobacter baumannii NOT DETECTED NOT DETECTED Final   Enterobacteriaceae species NOT DETECTED NOT DETECTED Final   Enterobacter cloacae complex NOT DETECTED NOT DETECTED Final   Escherichia coli NOT DETECTED NOT DETECTED Final   Klebsiella oxytoca NOT DETECTED NOT DETECTED Final   Klebsiella pneumoniae NOT DETECTED NOT DETECTED Final   Proteus species NOT DETECTED NOT DETECTED Final   Serratia marcescens NOT DETECTED NOT DETECTED Final   Haemophilus influenzae NOT DETECTED NOT DETECTED Final   Neisseria meningitidis NOT DETECTED NOT DETECTED Final   Pseudomonas aeruginosa NOT DETECTED NOT DETECTED Final   Candida albicans NOT DETECTED NOT DETECTED Final   Candida glabrata NOT DETECTED NOT DETECTED Final   Candida krusei NOT DETECTED NOT DETECTED Final   Candida parapsilosis NOT DETECTED NOT DETECTED Final   Candida tropicalis NOT DETECTED NOT DETECTED Final  MRSA PCR Screening      Status: None   Collection Time: 05/16/16  6:09 PM  Result Value Ref Range Status   MRSA by PCR NEGATIVE NEGATIVE Final    Comment:        The GeneXpert MRSA Assay (FDA approved for NASAL specimens only), is one component of a comprehensive MRSA colonization surveillance program. It is not intended to diagnose MRSA infection nor to guide or monitor treatment for MRSA infections.     Impression/Plan:  1. Enterococcal bacteremia - can narrow to ampicillin and picc line tomorrow if repeat blood cultures remain negative.  Treat for 2 weeks through  September 16th and  can pull picc line at the end of treatment.   Does not need follow up with us unless there are clinical changes Antibiotics per SNF 2. Palliative care - ongoing discussion  I will sign off, thanks

## 2016-05-19 NOTE — Consult Note (Signed)
Consultation Note Date: 05/19/2016   Patient Name: Katherine Palmer  DOB: 1961-06-13  MRN: [id number]  Age / Sex: 55 y.o., female  PCP: Gildardo Cranker, DO Referring Physician: Kelvin Cellar, MD  Reason for Consultation: Establishing goals of care and Psychosocial/spiritual support  HPI/Patient Profile: 55 y.o. 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 She was started on antibiotics as well as fluid resuscitation. Her chest x-ray was clear. Her hemoglobin on 05/16/2016 was 10.3. On 05/19/2016 it is now 7.1. No obvious signs of bleeding. Met with 2 of her 4 children, daughters Katherine Palmer and  Katherine Palmer for goals of care discussion. They share with me that this is their mother's third hospitalization in 2017.   Clinical Assessment and Goals of Care: Patient is more confused today than when I met her on 05/18/2016. There is some question in my mind whether she is hallucinating. She also vomited profusely without accompanying nausea . I do observe her coughing afterwards. As noted above daughter shares that this is their mother's third hospitalization this year. She was hospitalized at [facility] early in the year where she underwent a left AKA as well as colostomy. This was a prolonged hospitalization. She has been in 3 facilities this year. Bulk of our conversation today was providing education on her current clinical status and very specific education on her poor vascular status, with accompanying dementia as well as encephalomalacia. I did try to prepare them for what this could look like going forward in terms of recurrent infections. That there will be a place where the antibiotics will have more  risk than benefit such as development of C. difficile. We also discussed aspects of her worsening immune system in terms of her wounds and ability to fight off infections. I did introduce the term albumin and how this factors into her ability to heal but also her overall functional decline was revisited in the detail. Per daughter she was walking the first of the year and is now contracted. They share that their mother did not raise them and that as a family and they have never discussed end-of-life wishes. There are 2 other siblings. Patient did not stipulate a power of attorney for healthcare power of attorney.  Patient at this point is unable to participate in goals of care discussions. She does not have a designated healthcare power of attorney so decision making would fall to the majority of her 4 children    SUMMARY OF RECOMMENDATIONS   Full code Continue antibiotics and aggressive care Palliative medicine team will attempt to speak to patient during this hospitalization  and illicit her thoughts about her quality of life. This is with her daughter's permission Code Status/Advance Care Planning:  Full code    Symptom Management:   Pain: Continue with oxycodone by mouth before 2 every 6 hours as needed  Nausea: We'll add Zofran IV as needed  Palliative Prophylaxis:   Aspiration, Bowel Regimen, Delirium Protocol, Frequent Pain Assessment, Oral Care and Turn Reposition  Additional Recommendations (Limitations, Scope, Preferences):  Full Scope Treatment  Psycho-social/Spiritual:   Desire for further Chaplaincy support:no   Prognosis:   Unable to determine.   Discharge Planning: Back to skilled facility      Primary Diagnoses: Present on Admission: . Septic shock ([place]) . Severe sepsis ([place]) . Lactic acidosis . Stage IV decubitus ulcer ([place]) . Acute encephalopathy   I have reviewed the medical record, interviewed the patient and family, and examined the patient. The  following aspects are pertinent.  Past Medical History:  Diagnosis Date  . Decubitus ulcer of sacral region, stage 4 ([place])   . Dysphagia   . Dysphagia as late effect of cerebrovascular disease   . Hyperlipidemia   . Hypertension   . Seizures ([place])   . Stroke [facility])    Social History   Social History  . Marital status: Divorced    Spouse name: N/A  . Number of children: N/A  . Years of education: N/A   Social History Main Topics  . Smoking status: Former Research scientist (life sciences)  . Smokeless tobacco: Never Used  . Alcohol use None  . Drug use: Unknown  . Sexual activity: No   Other Topics Concern  . None   Social History Narrative  . None   Family History  Problem Relation Age of Onset  . Family history unknown: Yes   Scheduled Meds: . sodium chloride   Intravenous Once  . aspirin  81 mg Per Tube Daily  . buPROPion  75 mg Per Tube BID  . chlorhexidine  15 mL Mouth Rinse BID  . famotidine  20 mg Oral Daily  . feeding supplement (PRO-STAT SUGAR FREE 64)  30 mL Oral BID  . heparin  5,000 Units Subcutaneous Q8H  . insulin aspart  0-9 Units Subcutaneous Q4H  . ipratropium-albuterol  3 mL Nebulization Q6H  . mouth rinse  15 mL Mouth Rinse q12n4p  . sodium hypochlorite   Irrigation BID  . Valproate Sodium  200 mg Oral Q8H  . vancomycin  1,000 mg Intravenous Q24H   Continuous Infusions:  PRN Meds:.acetaminophen, oxyCODONE, sodium chloride flush Medications Prior to Admission:  Prior to Admission medications   Medication Sig Start Date End Date Taking? Authorizing Provider  acetaminophen (TYLENOL) 325 MG tablet Place 650 mg into feeding tube every 4 (four) hours as needed for mild pain.    Yes Historical Provider, MD  albuterol (PROVENTIL) (2.5 MG/3ML) 0.083% nebulizer solution Take 2.5 mg by nebulization every 4 (four) hours as needed for wheezing or shortness of breath.   Yes Historical Provider, MD  aspirin EC 81 MG tablet 81 mg daily. Via G-Tube   Yes Historical Provider, MD    bisacodyl (DULCOLAX) 10 MG suppository Place 10 mg rectally daily as needed for moderate constipation.   Yes Historical Provider, MD  buPROPion (WELLBUTRIN) 75 MG tablet Place 150 mg into feeding tube 2 (two) times daily.   Yes Historical Provider, MD  cloNIDine (CATAPRES) 0.1 MG tablet Take 0.1 mg by mouth every 6 (six) hours as needed (blood pressure).    Yes Historical Provider, MD  FA-B6-B12-ARGININE-BLACKPEPPER PO Give 1 tablet by tube daily.   Yes Historical Provider, MD  feeding supplement (BOOST HIGH PROTEIN) LIQD Take 1 Container by mouth 3 (three) times daily between meals.   Yes Historical Provider, MD  ipratropium (ATROVENT) 0.02 %  nebulizer solution Take 2.5 mg by nebulization every 4 (four) hours as needed for wheezing or shortness of breath.   Yes Historical Provider, MD  LORazepam (ATIVAN) 0.5 MG tablet Take 0.5 mg by mouth every 8 (eight) hours as needed for anxiety.   Yes Historical Provider, MD  magnesium hydroxide (MILK OF MAGNESIA) 400 MG/5ML suspension Take 30 mLs by mouth daily as needed for mild constipation.   Yes Historical Provider, MD  Multiple Vitamins-Minerals (DECUBI-VITE) CAPS Take 1 capsule by mouth daily.   Yes Historical Provider, MD  OxyCODONE HCl, Abuse Deter, (OXAYDO) 5 MG TABA Take 5 mg by mouth every 6 (six) hours as needed (pain).    Yes Historical Provider, MD  pantoprazole sodium (PROTONIX) 40 mg/20 mL PACK Place 40 mg into feeding tube daily.   Yes Historical Provider, MD  polyethylene glycol (MIRALAX / GLYCOLAX) packet Place 17 g into feeding tube daily.   Yes Historical Provider, MD  pravastatin (PRAVACHOL) 40 MG tablet Place 40 mg into feeding tube daily.   Yes Historical Provider, MD  Prenatal Ca Carb-B6-B12-FA (FOLBECAL) 1 MG TABS Give 1 tablet by tube daily.   Yes Historical Provider, MD  Valproate Sodium (DEPAKENE PO) Give 200 mg by tube every 8 (eight) hours.   Yes Historical Provider, MD   No Known Allergies Review of Systems  Unable to perform  ROS   Physical Exam  Constitutional:  Frail cachetic acutely ill appearing fenmle  HENT:  Head: Normocephalic and atraumatic.  Pulmonary/Chest: Effort normal.  Abdominal:  PEG Colostomy with good output  Musculoskeletal:  LE contractures  Neurological: She is alert.  To self Recognizes her daughters. Can answer simple questions appropriately. ? hallucinating  Skin: Skin is warm and dry.  Psychiatric:  Unable to test; confused; ? hallucinating  Nursing note and vitals reviewed.   Vital Signs: BP 132/79 (BP Location: Left Arm)   Pulse 85   Temp 98.2 F (36.8 C) (Oral)   Resp 20   Wt 43.5 kg (96 lb)   SpO2 95%   BMI 17.01 kg/m  Pain Assessment: Faces   Pain Score: Asleep   SpO2: SpO2: 95 % O2 Device:SpO2: 95 % O2 Flow Rate: .   IO: Intake/output summary:  Intake/Output Summary (Last 24 hours) at 05/19/16 1150 Last data filed at 05/19/16 1031  Gross per 24 hour  Intake              850 ml  Output             2400 ml  Net            -1550 ml    LBM: Last BM Date:  (ostomy) Baseline Weight: Weight: 46.5 kg (102 lb 9 oz) Most recent weight: Weight: 43.5 kg (96 lb)     Palliative Assessment/Data:   Flowsheet Rows   Flowsheet Row Most Recent Value  Intake Tab  Referral Department  Hospitalist  Unit at Time of Referral  Med/Surg Unit  Palliative Care Primary Diagnosis  Sepsis/Infectious Disease  Date Notified  05/18/16  Palliative Care Type  New Palliative care  Reason for referral  Clarify Goals of Care  Date of Admission  05/16/16  Date first seen by Palliative Care  05/19/16  # of days Palliative referral response time  1 Day(s)  # of days IP prior to Palliative referral  2  Clinical Assessment  Palliative Performance Scale Score  30%  Pain Max last 24 hours  Not able to report  Pain  Min Last 24 hours  Not able to report  Dyspnea Max Last 24 Hours  Not able to report  Dyspnea Min Last 24 hours  Not able to report  Nausea Max Last 24 Hours  Not able  to report  Nausea Min Last 24 Hours  Not able to report  Anxiety Max Last 24 Hours  Not able to report  Anxiety Min Last 24 Hours  Not able to report  Other Max Last 24 Hours  Not able to report  Psychosocial & Spiritual Assessment  Palliative Care Outcomes  Patient/Family meeting held?  Yes  Who was at the meeting?  2 of 4 children,  daughters Katherine Palmer and [place] follow-up planned  Yes, Facility      Time In: 1000 Time Out: 1120 Time Total: 80 min Greater than 50%  of this time was spent counseling and coordinating care related to the above assessment and plan. Staffed with Dr. Coralyn Pear  Signed by: Dory Horn, NP   Please contact Palliative Medicine Team phone at [phone] for questions and concerns.  For individual provider: See Shea Evans

## 2016-05-20 DIAGNOSIS — Z89612 Acquired absence of left leg above knee: Secondary | ICD-10-CM

## 2016-05-20 LAB — BASIC METABOLIC PANEL
Anion gap: 6 (ref 5–15)
BUN: 11 mg/dL (ref 6–20)
CALCIUM: 8.4 mg/dL — AB (ref 8.9–10.3)
CO2: 21 mmol/L — AB (ref 22–32)
CREATININE: 0.36 mg/dL — AB (ref 0.44–1.00)
Chloride: 118 mmol/L — ABNORMAL HIGH (ref 101–111)
GFR calc non Af Amer: >60 mL/min (ref >60)
Glucose, Bld: 101 mg/dL — ABNORMAL HIGH (ref 65–99)
Potassium: 3.2 mmol/L — ABNORMAL LOW (ref 3.5–5.1)
Sodium: 145 mmol/L (ref 135–145)

## 2016-05-20 LAB — TYPE AND SCREEN
ABO/RH(D): O POS
ANTIBODY SCREEN: NEGATIVE
Unit division: 0

## 2016-05-20 LAB — CBC
HCT: 29.1 % — ABNORMAL LOW (ref 36.0–46.0)
Hemoglobin: 8.6 g/dL — ABNORMAL LOW (ref 12.0–15.0)
MCH: 27 pg (ref 26.0–34.0)
MCHC: 29.6 g/dL — AB (ref 30.0–36.0)
MCV: 91.2 fL (ref 78.0–100.0)
PLATELETS: 344 10*3/uL (ref 150–400)
RBC: 3.19 MIL/uL — AB (ref 3.87–5.11)
RDW: 19.5 % — AB (ref 11.5–15.5)
WBC: 7.5 10*3/uL (ref 4.0–10.5)

## 2016-05-20 LAB — GLUCOSE, CAPILLARY
GLUCOSE-CAPILLARY: 103 mg/dL — AB (ref 65–99)
GLUCOSE-CAPILLARY: 101 mg/dL — AB (ref 65–99)
Glucose-Capillary: 144 mg/dL — ABNORMAL HIGH (ref 65–99)
Glucose-Capillary: 96 mg/dL (ref 65–99)

## 2016-05-20 LAB — CULTURE, BLOOD (ROUTINE X 2)

## 2016-05-20 MED ORDER — IPRATROPIUM-ALBUTEROL 0.5-2.5 (3) MG/3ML IN SOLN: 3.0000 mL | RESPIRATORY_TRACT | Status: DC | PRN

## 2016-05-20 MED ORDER — PRO-STAT SUGAR FREE PO LIQD
30.0000 mL | Freq: Two times a day (BID) | ORAL | Status: DC
  Administered 2016-05-20 – 2016-05-21 (×2): 30 mL
  Filled 2016-05-20 (×2): qty 30

## 2016-05-20 MED ORDER — SODIUM CHLORIDE 0.9% FLUSH
10.0000 mL | INTRAVENOUS | Status: DC | PRN
  Administered 2016-05-21 (×2): 10 mL
  Filled 2016-05-20 (×2): qty 40

## 2016-05-20 MED ORDER — POTASSIUM CHLORIDE 20 MEQ PO PACK
40.0000 meq | PACK | Freq: Once | ORAL | Status: AC
  Administered 2016-05-20: 40 meq
  Filled 2016-05-20: qty 2

## 2016-05-20 MED ORDER — JEVITY 1.2 CAL PO LIQD: 1000.0000 mL | ORAL | Status: DC

## 2016-05-20 MED ORDER — OSMOLITE 1.5 CAL PO LIQD
1000.0000 mL | ORAL | Status: DC
  Administered 2016-05-20: 1000 mL
  Filled 2016-05-20 (×4): qty 1000

## 2016-05-20 NOTE — Progress Notes (Signed)
PROGRESS NOTE    Katherine Palmer  ZOX:[id number] DOB: May 20, 1961 DOA: 05/16/2016 PCP: Kirt Boys, DO   Brief Narrative:  Katherine Palmer is a 55 year old with history of stage IV decubitus ulcer, dysphagia status post PEG tube placement, history of CVA, status post left above-the-knee amputation, status post colostomy placement, admitted to the pulmonary critical care service on 05/16/2016. She was found to be in respiratory distress having mental status changes at her skilled nursing facility for which she was transferred to the emergency department. On presentation she was septic having a temperature of 105, systolic blood pressures in the 80s, white count of 16,100. Blood cultures growing enterococcus species 1 out of 2. Dr Luciana Axe of infectious disease was consulted. She was treated with IV Vancomycin. There was suspicion that source of infectious could be coming from wound although this did not appear to have evidence of active infection. Other potential sources could be GI and urinary tract. Transthoracic echocardiogram performed on 05/17/2016 did not reveal evidence of vegetation. Ejection fraction was 50-55% without wall motion abnormalities.                                Assessment & Plan:   Active Problems:   S/P AKA (above knee amputation) (HCC)   Septic shock (HCC)   Acute encephalopathy   Severe sepsis (HCC)   Lactic acidosis   Pressure ulcer   Palliative care encounter   Stage IV decubitus ulcer (HCC)   Acute respiratory failure (HCC)  1.  Septic shock -Evidence by patient having positive blood cultures growing enterococcus, temperature of 105, mental status changes, respiratory failure, hypotension, white count of 16,100, present on admission. -Source of infection could be from stage IV decubitus ulcer or perhaps she may have an intra-abdominal source or urinary tract infection. -Plan to continue IV antibiotic therapy with vancomycin -Remains hemodynamically stable now and  showing clinical improvement since presentation. -On 05/19/2016 microbiology reporting Enterococcus Faecalis organism sensitive to Ampicillin, Gentamicin and Vancomycin. Will stop Vancomycin and narrow antimicrobial spectrum to IV Ampicillin. Case discussed with Dr Luciana Axe of ID. Repeat blood cultures were drawn on 05/19/2016. Plan to place a PICC line and treat for a total of 14 days of IV antibiotic therapy with anticipated stop date: 06/01/2016  2.  History of stage IV decubitus ulcer -Patient having poor functional status at baseline, bed-bound having a history of CVA and status post left above-the-knee amputation -Wound care consulted -On exam wound appears beefy red, bone is visible, did not appreciate purulence -Continue wound care  3. Acute respiratory failure -At her facility she was found to be in respiratory distress, having labored respirations, mental status changes, likely secondary to sepsis/critifal illness.   4.   History of CVA with residual left-sided hemiparesis -Status post PEG tube placement -Per outpatient notes from [place] [facility] has pleasure foods however relies on PEG tube for nutritional support, no signs of aspiration have been present. -SLP consulted, diet advanced to Dysphagia 1 -Restarted Prostat per tube BID  5.  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 Anemia -Hg trending down to 7.1 however, there is no evidence of GI bleed. - 05/19/2016: Will transfuse 1 unit of PRBC's today -Repeat lab work on 05/20/2016 showing hemoglobin of 8.6.  7.  Hypokalemia -AM laboratory potassium 3.2, will replace with potassium chloride 40 mEq per tube  8.  Nutrition -Case discussed with Kirkland Hun RD, who  recommended initiating Osmolite 1.5 at 20 ML's per hour with goal to increase by 10 ML's every 4 hours for a goal rate of 60 ML's per hour.   DVT prophylaxis: Heparin 5,000 units TID Code Status: Full Code Family Communication:    Disposition Plan: Plan to D/C back to her facility in the next 24 hours  Consultants:   PCCM  ID  Procedures:   TTE performed on 05/17/2016 IMPRESSION:  - Left ventricle: Inferior wall hypokinesis The cavity size was   normal. Systolic function was normal. The estimated ejection   fraction was in the range of 50% to 55%. Wall motion was normal;   there were no regional wall motion abnormalities. Left   ventricular diastolic function parameters were normal. - Mitral valve: There was mild regurgitation. - Atrial septum: No defect or patent foramen ovale was identified.  Antimicrobials:   Vancomycin  Subjective: She complains of right knee pain on this morning's evaluation.   Objective: Vitals:   05/20/16 0241 05/20/16 0457 05/20/16 0507 05/20/16 0905  BP:  (!) 145/84    Pulse:  90    Resp:  17    Temp:  98.1 F (36.7 C)    TempSrc:  Oral    SpO2: 97% 98%  98%  Weight:   44 kg (97 lb)     Intake/Output Summary (Last 24 hours) at 05/20/16 1145 Last data filed at 05/20/16 0656  Gross per 24 hour  Intake             1365 ml  Output             2200 ml  Net             -835 ml   Filed Weights   05/18/16 0500 05/19/16 0300 05/20/16 0507  Weight: 45.4 kg (100 lb) 43.5 kg (96 lb) 44 kg (97 lb)    Examination:  General exam: She is in no acute distress awake alert, pleasant Respiratory system: Clear to auscultation. Respiratory effort normal. Cardiovascular system: S1 & S2 heard, RRR. No JVD, murmurs, rubs, gallops or clicks. No pedal edema. Gastrointestinal system: Abdomen is nondistended, soft and nontender. No organomegaly or masses felt. Normal bowel sounds heard. Status post PEG tube placement Central nervous system: Alert and oriented. No focal neurological deficits. Extremities: S/p left above the knee amputation Skin: There is a large stage IV decubitus ulcer, appears beefy red, bone was visible, I did not appreciate purulence, fluctuance or evidence of active  infection.  Psychiatry: Confused however no acute distress  Data Reviewed: I have personally reviewed following labs and imaging studies  CBC:  Recent Labs Lab 05/16/16 1233 05/17/16 0352 05/19/16 0429 05/20/16 0414  WBC 16.1* 16.1* 7.7 7.5  NEUTROABS 12.1*  --   --   --   HGB 10.7* 7.1* 7.1* 8.6*  HCT 37.8 25.1* 24.5* 29.1*  MCV 99.7 98.8 95.0 91.2  PLT 478* 382 362 344   Basic Metabolic Panel:  Recent Labs Lab 05/17/16 0352 05/17/16 1856 05/18/16 0800 05/18/16 1200 05/19/16 0429 05/20/16 0414  NA 152* 147* 144 141 141 145  K 3.3* 3.9 3.7 3.6 3.5 3.2*  CL 127* 124* 119* 118* 116* 118*  CO2 21* 19* 20* 19* 20* 21*  GLUCOSE 92 81 79 88 96 101*  BUN 39* 23* 16 15 10 11   CREATININE <0.30* <0.30* <0.30* <0.30* <0.30* 0.36*  CALCIUM 7.9* 8.8* 8.7* 8.6* 8.5* 8.4*  MG 2.0  --   --   --   --   --  PHOS 2.7  --   --   --   --   --    GFR: Estimated Creatinine Clearance: 55.2 mL/min (by C-G formula based on SCr of 0.8 mg/dL). Liver Function Tests:  Recent Labs Lab 05/16/16 1233  AST 46*  ALT 31  ALKPHOS 80  BILITOT 0.7  PROT 8.2*  ALBUMIN 1.9*   No results for input(s): LIPASE, AMYLASE in the last 168 hours. No results for input(s): AMMONIA in the last 168 hours. Coagulation Profile:  Recent Labs Lab 05/18/16 0800  INR 1.31   Cardiac Enzymes:  Recent Labs Lab 05/16/16 1713 05/16/16 2152 05/17/16 0352  TROPONINI 0.05* 0.06* 0.05*   BNP (last 3 results) No results for input(s): PROBNP in the last 8760 hours. HbA1C: No results for input(s): HGBA1C in the last 72 hours. CBG:  Recent Labs Lab 05/19/16 1634 05/19/16 2032 05/19/16 2355 05/20/16 0448 05/20/16 0810  GLUCAP 138* 114* 113* 103* 101*   Lipid Profile: No results for input(s): CHOL, HDL, LDLCALC, TRIG, CHOLHDL, LDLDIRECT in the last 72 hours. Thyroid Function Tests: No results for input(s): TSH, T4TOTAL, FREET4, T3FREE, THYROIDAB in the last 72 hours. Anemia Panel: No results for  input(s): VITAMINB12, FOLATE, FERRITIN, TIBC, IRON, RETICCTPCT in the last 72 hours. Sepsis Labs:  Recent Labs Lab 05/16/16 1254 05/16/16 1713 05/16/16 1943 05/17/16 0352 05/18/16 0800  PROCALCITON  --  5.22  --  5.48 3.10  LATICACIDVEN 4.88* 1.0 1.1  --   --     Recent Results (from the past 240 hour(s))  Culture, blood (Routine x 2)     Status: None (Preliminary result)   Collection Time: 05/16/16 12:33 PM  Result Value Ref Range Status   Specimen Description BLOOD LEFT FOREARM  Final   Special Requests IN PEDIATRIC BOTTLE 1CC  Final   Culture NO GROWTH 3 DAYS  Final   Report Status PENDING  Incomplete  Culture, blood (Routine x 2)     Status: None (Preliminary result)   Collection Time: 05/16/16  2:05 PM  Result Value Ref Range Status   Specimen Description BLOOD RIGHT FOREARM  Final   Special Requests BOTTLES DRAWN AEROBIC AND ANAEROBIC 10CC  Final   Culture NO GROWTH 3 DAYS  Final   Report Status PENDING  Incomplete  C difficile quick scan w PCR reflex     Status: None   Collection Time: 05/16/16  4:51 PM  Result Value Ref Range Status   C Diff antigen NEGATIVE NEGATIVE Final   C Diff toxin NEGATIVE NEGATIVE Final   C Diff interpretation No C. difficile detected.  Final  Urine culture     Status: Abnormal   Collection Time: 05/16/16  4:52 PM  Result Value Ref Range Status   Specimen Description URINE, RANDOM  Final   Special Requests NONE  Final   Culture (A)  Final    20,000 COLONIES/mL ESCHERICHIA COLI Confirmed Extended Spectrum Beta-Lactamase Producer (ESBL)    Report Status 05/18/2016 FINAL  Final   Organism ID, Bacteria ESCHERICHIA COLI (A)  Final      Susceptibility   Escherichia coli - MIC*    AMPICILLIN >=32 RESISTANT Resistant     CEFAZOLIN >=64 RESISTANT Resistant     CEFTRIAXONE >=64 RESISTANT Resistant     CIPROFLOXACIN >=4 RESISTANT Resistant     GENTAMICIN <=1 SENSITIVE Sensitive     IMIPENEM <=0.25 SENSITIVE Sensitive     NITROFURANTOIN <=16  SENSITIVE Sensitive     TRIMETH/SULFA <=20 SENSITIVE Sensitive  AMPICILLIN/SULBACTAM >=32 RESISTANT Resistant     PIP/TAZO 8 SENSITIVE Sensitive     Extended ESBL POSITIVE Resistant     * 20,000 COLONIES/mL ESCHERICHIA COLI  Culture, blood (Routine X 2) w Reflex to ID Panel     Status: None (Preliminary result)   Collection Time: 05/16/16  5:13 PM  Result Value Ref Range Status   Specimen Description BLOOD A-LINE  Final   Special Requests BOTTLES DRAWN AEROBIC AND ANAEROBIC 5CC   Final   Culture NO GROWTH 3 DAYS  Final   Report Status PENDING  Incomplete  Culture, blood (Routine X 2) w Reflex to ID Panel     Status: Abnormal   Collection Time: 05/16/16  5:21 PM  Result Value Ref Range Status   Specimen Description BLOOD BLOOD LEFT FOREARM  Final   Special Requests IN PEDIATRIC BOTTLE 2CC  Final   Culture  Setup Time   Final    GRAM POSITIVE COCCI IN PAIRS AND CHAINS AEROBIC BOTTLE ONLY CRITICAL RESULT CALLED TO, READ BACK BY AND VERIFIED WITH: J FRENS,PHARMD AT 1030 05/17/16 BY L BENFIELD    Culture (A)  Final    ENTEROCOCCUS FAECALIS STAPHYLOCOCCUS SPECIES (COAGULASE NEGATIVE) THE SIGNIFICANCE OF ISOLATING THIS ORGANISM FROM A SINGLE SET OF BLOOD CULTURES WHEN MULTIPLE SETS ARE DRAWN IS UNCERTAIN. PLEASE NOTIFY THE MICROBIOLOGY DEPARTMENT WITHIN ONE WEEK IF SPECIATION AND SENSITIVITIES ARE REQUIRED.    Report Status 05/20/2016 FINAL  Final   Organism ID, Bacteria ENTEROCOCCUS FAECALIS  Final      Susceptibility   Enterococcus faecalis - MIC*    AMPICILLIN <=2 SENSITIVE Sensitive     VANCOMYCIN 2 SENSITIVE Sensitive     GENTAMICIN SYNERGY SENSITIVE Sensitive     * ENTEROCOCCUS FAECALIS  Blood Culture ID Panel (Reflexed)     Status: Abnormal   Collection Time: 05/16/16  5:21 PM  Result Value Ref Range Status   Enterococcus species DETECTED (A) NOT DETECTED Final    Comment: CRITICAL RESULT CALLED TO, READ BACK BY AND VERIFIED WITH: J FRENS,PHARMD AT 1030 05/17/16 BY L  BENFIELD    Vancomycin resistance NOT DETECTED NOT DETECTED Final   Listeria monocytogenes NOT DETECTED NOT DETECTED Final   Staphylococcus species NOT DETECTED NOT DETECTED Final   Staphylococcus aureus NOT DETECTED NOT DETECTED Final   Streptococcus species NOT DETECTED NOT DETECTED Final   Streptococcus agalactiae NOT DETECTED NOT DETECTED Final   Streptococcus pneumoniae NOT DETECTED NOT DETECTED Final   Streptococcus pyogenes NOT DETECTED NOT DETECTED Final   Acinetobacter baumannii NOT DETECTED NOT DETECTED Final   Enterobacteriaceae species NOT DETECTED NOT DETECTED Final   Enterobacter cloacae complex NOT DETECTED NOT DETECTED Final   Escherichia coli NOT DETECTED NOT DETECTED Final   Klebsiella oxytoca NOT DETECTED NOT DETECTED Final   Klebsiella pneumoniae NOT DETECTED NOT DETECTED Final   Proteus species NOT DETECTED NOT DETECTED Final   Serratia marcescens NOT DETECTED NOT DETECTED Final   Haemophilus influenzae NOT DETECTED NOT DETECTED Final   Neisseria meningitidis NOT DETECTED NOT DETECTED Final   Pseudomonas aeruginosa NOT DETECTED NOT DETECTED Final   Candida albicans NOT DETECTED NOT DETECTED Final   Candida glabrata NOT DETECTED NOT DETECTED Final   Candida krusei NOT DETECTED NOT DETECTED Final   Candida parapsilosis NOT DETECTED NOT DETECTED Final   Candida tropicalis NOT DETECTED NOT DETECTED Final  MRSA PCR Screening     Status: None   Collection Time: 05/16/16  6:09 PM  Result Value Ref Range Status  MRSA by PCR NEGATIVE NEGATIVE Final    Comment:        The GeneXpert MRSA Assay (FDA approved for NASAL specimens only), is one component of a comprehensive MRSA colonization surveillance program. It is not intended to diagnose MRSA infection nor to guide or monitor treatment for MRSA infections.   Culture, blood (routine x 2)     Status: None (Preliminary result)   Collection Time: 05/19/16  5:18 AM  Result Value Ref Range Status   Specimen  Description BLOOD LEFT HAND  Final   Special Requests IN PEDIATRIC BOTTLE 1/2CC  Final   Culture NO GROWTH < 12 HOURS  Final   Report Status PENDING  Incomplete  Culture, blood (routine x 2)     Status: None (Preliminary result)   Collection Time: 05/19/16  5:42 AM  Result Value Ref Range Status   Specimen Description BLOOD LEFT HAND  Final   Special Requests IN PEDIATRIC BOTTLE 1CC  Final   Culture NO GROWTH < 12 HOURS  Final   Report Status PENDING  Incomplete         Radiology Studies: No results found.   Scheduled Meds: . ampicillin (OMNIPEN) IV  2 g Intravenous Q6H  . aspirin  81 mg Per Tube Daily  . buPROPion  75 mg Per Tube BID  . chlorhexidine  15 mL Mouth Rinse BID  . famotidine  20 mg Oral Daily  . feeding supplement (PRO-STAT SUGAR FREE 64)  30 mL Oral BID  . heparin  5,000 Units Subcutaneous Q8H  . insulin aspart  0-9 Units Subcutaneous Q4H  . ipratropium-albuterol  3 mL Nebulization Q6H  . mouth rinse  15 mL Mouth Rinse q12n4p  . sodium hypochlorite   Irrigation BID  . Valproate Sodium  200 mg Oral Q8H   Continuous Infusions:    LOS: 4 days    Time spent: 25 min  Jeralyn Bennett, MD [facility] Pager [phone]  If 7PM-7AM, please contact night-coverage [email or web address] Password [email or web address] 05/20/2016, 11:45 AM

## 2016-05-20 NOTE — Progress Notes (Signed)
Peripherally Inserted Central Catheter/Midline Placement  The IV Nurse has discussed with the patient and/or persons authorized to consent for the patient, the purpose of this procedure and the potential benefits and risks involved with this procedure.  The benefits include less needle sticks, lab draws from the catheter and patient may be discharged home with the catheter.  Risks include, but not limited to, infection, bleeding, blood clot (thrombus formation), and puncture of an artery; nerve damage and irregular heat beat.  Alternatives to this procedure were also discussed.  PICC/Midline Placement Documentation        Lisabeth DevoidGibbs, Analyse Angst Jeanette 05/20/2016, 12:16 PM Phone consent obtained from daughter.

## 2016-05-20 NOTE — Progress Notes (Addendum)
Speech Language Pathology Treatment: Dysphagia  Patient Details Name: Katherine AhmadiDiane Palmer MRN: [id number] DOB: February 15, 1961 Today's Date: 05/20/2016 Time: [id number] SLP Time Calculation (min) (ACUTE ONLY): 36 min  Assessment / Plan / Recommendation Clinical Impression  Pt intake is poor (15%) and per RN she is not receiving PEG feedings.  SLP assisted pt to consume Sprite via straw (pt preference), moist graham crackers and puree.  As pt unable to be fully upright due to severe pain, SLP placed head neutral with liquids for maximal airway protection.  Delayed cough x1 noted after sequential liquid consumption.  Pt admits to premorbid occasional cough with solids more than liquids and she reports loss of dentures recently (she did not wear them for po previously per pt).  Despite lack of dentition, pt able to adequately "masticate" foods during session without oral residuals.    Recommend advance diet to Dys3/puree meats/thin with strict precautions - pt agreeable.  She will require full assistance due to her mentation/weakness.    Of note, pt reports issues with vomiting only she drinks hard liquor - and she is requesting cigarettes today.  In addition, a single episode of pt seeing "bugs" noted.    Will follow up for tolerance, pt/family education.     HPI HPI: Mrs Katherine Palmer is a 55 year old with history of stage IV decubitus ulcer, dysphagia status post PEG tube placement, history of CVA, status post left above-the-knee amputation, status post colostomy placement, admitted to the pulmonary critical care service on 05/16/2016. She was found to be in respiratory distress having mental status changes at her skilled nursing facility for which she was transferred to the emergency department. CT of head without acute intracranial abnormality, though remote large right hemispheric infarction noted. Pt denies use of PEG for nutrition and states she eats what she wants.  Puree/thin diet started in hospital.  Pt does  admit to occasional coughing with foods more than drinks.  Reports she does not wear dentures for po and recently lost them.        SLP Plan  Continue with current plan of care     Recommendations  Diet recommendations: Dysphagia 3 (mechanical soft);Thin liquid Liquids provided via: Cup;Straw Medication Administration: Whole meds with puree Supervision: Staff to assist with self feeding Compensations: Minimize environmental distractions;Slow rate;Small sips/bites;Other (Comment) (bring head neutral for maximum safety with po if not fully upright) Postural Changes and/or Swallow Maneuvers: Seated upright 90 degrees;Upright 30-60 min after meal             Oral Care Recommendations: Oral care BID Follow up Recommendations: Skilled Nursing facility Plan: Continue with current plan of care     GO                Katherine Burnetamara Berlinda Farve, MS [facility] SLP [phone]

## 2016-05-20 NOTE — Progress Notes (Signed)
Initial Nutrition Assessment  DOCUMENTATION CODES:   Severe malnutrition in context of chronic illness, Underweight  INTERVENTION:    Change to Dysphagia 1, thin liquid diet   Initiate Osmolite 1.5 formula at 20 ml/hr and increase by 10 ml every 4 hours to goal rate of 60 ml/hr   Continue Prostat liquid protein 30 ml BID (via tube)  Total TF regimen to provide 2360 kcals, 120 gm protein, 1097 ml of free water  NUTRITION DIAGNOSIS:   Malnutrition related to chronic illness as evidenced by severe depletion of body fat, severe depletion of muscle mass  GOAL:   Patient will meet greater than or equal to 90% of their needs  MONITOR:   TF tolerance, PO intake, Labs, Weight trends, Skin, I & O's  REASON FOR ASSESSMENT:   Consult Enteral/tube feeding initiation and management  ASSESSMENT:   55 year old female with full code states resident of [facility] residual left sided hemiparesis and contractures and rt BKA with colostomy. PEr ER RN - family reported that at baseline patient conversant as reported to ER per a video recording of patient. Currently presnts with acute encephalopathy and SIRS picture/severe sepsis picture with reduced Ur OOP and soft blood pressure.  [facility] as well as pt's sister, Olen PelKatawba. Per Olen PelKatawba, pt had her PEG tube [facility] at [facility] approximately 1 month ago. At SNF, pt takes PO's (Pureed diet) for "pleasure feeds;" also receives continuous TF (Osmolite 1.5 formula at 60 ml/hr) via PEG tube. Protein needs increased given Stage IV wound >>> continue Prostat liquid protein. Nutrition-Focused physical exam completed. Findings are severe fat depletion, severe muscle depletion, and no edema.   Diet Order:  DIET DYS 3 Room service appropriate? Yes; Fluid consistency: Thin  Skin:  Wound (see comment) (Stage IV to sacrum)  Last BM:  9/3  Height:   Ht Readings from Last 1  Encounters:  05/16/16 5\' 3"  (1.6 m)    Weight:   Wt Readings from Last 1 Encounters:  05/20/16 97 lb (44 kg)    Ideal Body Weight:  52.2 kg  BMI:  Body mass index is 17.18 kg/m.  Estimated Nutritional Needs:   Kcal:  1800-2000  Protein:  105-115 gm  Fluid:  1.8-2.0 L  EDUCATION NEEDS:   No education needs identified at this time  Maureen ChattersKatie Gwenna Fuston, RD, LDN Pager #: [phone] After-Hours Pager #: [phone]

## 2016-05-20 NOTE — Clinical Social Work Note (Signed)
Clinical Social Work Assessment  Patient Details  Name: Katherine AhmadiDiane Palmer MRN: [id number] Date of Birth: March 03, 1961  Date of referral:  05/20/16               Reason for consult:  Facility Placement                Permission sought to share information with:  Facility Medical sales representativeContact Representative, Family Supports Permission granted to share information::  No (DO)  Name::     Katherine Palmer  [facility]  Relationship::  dtr  Contact Information:     Housing/Transportation Living arrangements for the past 2 months:  Skilled Nursing Facility Source of Information:  Adult Children Patient Interpreter Needed:  None Criminal Activity/Legal Involvement Pertinent to Current Situation/Hospitalization:  No - Comment as needed Significant Relationships:  Adult Children Lives with:  Facility Resident Do you feel safe going back to the place where you live?  No Need for family participation in patient care:  Yes (Comment) (decision making)  Care giving concerns:  [facility]- no concerns by family at this time [facility]   Social Worker assessment / plan: CSW spoke with pt dtr concerning [facility] when stable to leave hospital- dtr confirms that plan is for pt to return to SNF for further care.  Employment status:    Insurance information:  [place] Recommendations:  Not assessed at this time Information / Referral to community resources:     Patient/Family's Response to care:  Dtr agreeable to plan to return to SNF when stable.  Patient/Family's Understanding of and Emotional Response to Diagnosis, Current Treatment, and Prognosis:  No questions or concerns at this time, dtr is hopeful that pt will be stable to leave the hospital soon.  Emotional Assessment Appearance:  Appears stated age Attitude/Demeanor/Rapport:    Affect (typically observed):    Orientation:  Oriented to Self Alcohol / Substance use:  Not Applicable Psych  involvement (Current and /or in the community):  No (Comment)  Discharge Needs  Concerns to be addressed:  Care Coordination Readmission within the last 30 days:  No Current discharge risk:  Physical Impairment Barriers to Discharge:  Continued Medical Work up   Burna SisUris, Bria Sparr H, LCSW 05/20/2016, 9:28 AM

## 2016-05-21 DIAGNOSIS — Z7189 Other specified counseling: Secondary | ICD-10-CM

## 2016-05-21 DIAGNOSIS — Z789 Other specified health status: Secondary | ICD-10-CM

## 2016-05-21 DIAGNOSIS — E43 Unspecified severe protein-calorie malnutrition: Secondary | ICD-10-CM | POA: Insufficient documentation

## 2016-05-21 LAB — GLUCOSE, CAPILLARY
GLUCOSE-CAPILLARY: 99 mg/dL (ref 65–99)
GLUCOSE-CAPILLARY: 128 mg/dL — AB (ref 65–99)
GLUCOSE-CAPILLARY: 127 mg/dL — AB (ref 65–99)

## 2016-05-21 LAB — BASIC METABOLIC PANEL
Anion gap: 6 (ref 5–15)
BUN: 8 mg/dL (ref 6–20)
CALCIUM: 8.4 mg/dL — AB (ref 8.9–10.3)
CHLORIDE: 114 mmol/L — AB (ref 101–111)
CO2: 23 mmol/L (ref 22–32)
CREATININE: 0.38 mg/dL — AB (ref 0.44–1.00)
Glucose, Bld: 108 mg/dL — ABNORMAL HIGH (ref 65–99)
Potassium: 3.2 mmol/L — ABNORMAL LOW (ref 3.5–5.1)
SODIUM: 143 mmol/L (ref 135–145)

## 2016-05-21 LAB — CBC
HCT: 28 % — ABNORMAL LOW (ref 36.0–46.0)
HEMOGLOBIN: 8.1 g/dL — AB (ref 12.0–15.0)
MCH: 26.6 pg (ref 26.0–34.0)
MCHC: 28.9 g/dL — AB (ref 30.0–36.0)
MCV: 92.1 fL (ref 78.0–100.0)
PLATELETS: 364 10*3/uL (ref 150–400)
RBC: 3.04 MIL/uL — ABNORMAL LOW (ref 3.87–5.11)
RDW: 19.4 % — ABNORMAL HIGH (ref 11.5–15.5)
WBC: 6.5 10*3/uL (ref 4.0–10.5)

## 2016-05-21 LAB — CULTURE, BLOOD (ROUTINE X 2)
CULTURE: NO GROWTH
CULTURE: NO GROWTH
Culture: NO GROWTH

## 2016-05-21 MED ORDER — POTASSIUM CHLORIDE 20 MEQ/15ML (10%) PO SOLN
40.0000 meq | ORAL | Status: AC
  Administered 2016-05-21 (×2): 40 meq
  Filled 2016-05-21: qty 30

## 2016-05-21 MED ORDER — OSMOLITE 1.5 CAL PO LIQD: 1000.0000 mL | ORAL | 0 refills | Status: AC

## 2016-05-21 MED ORDER — HEPARIN SOD (PORK) LOCK FLUSH 100 UNIT/ML IV SOLN
250.0000 [IU] | INTRAVENOUS | Status: AC | PRN
  Administered 2016-05-21: 250 [IU]

## 2016-05-21 MED ORDER — AMPICILLIN SODIUM 2 G IJ SOLR: 2.0000 g | INTRAMUSCULAR | 0 refills | Status: DC

## 2016-05-21 MED ORDER — SODIUM CHLORIDE 0.9 % IV SOLN
2.0000 g | INTRAVENOUS | Status: DC
  Administered 2016-05-21: 2 g via INTRAVENOUS
  Filled 2016-05-21 (×3): qty 2000

## 2016-05-21 MED ORDER — OXYCODONE HCL 5 MG PO TABA: 5.0000 mg | ORAL_TABLET | Freq: Four times a day (QID) | ORAL | 0 refills | Status: DC | PRN

## 2016-05-21 NOTE — Progress Notes (Addendum)
Nsg Discharge Note  Admit Date:  05/16/2016 Discharge date: 05/21/2016   Katherine Palmer to be D/C'd Skilled nursing facility per MD order.  AVS completed.  Copy for chart, and copy for patient signed, and dated. Patient/caregiver able to verbalize understanding.  Discharge Medication:   Medication List    STOP taking these medications   FA-B6-B12-ARGININE-BLACKPEPPER PO     TAKE these medications   acetaminophen 325 MG tablet Commonly known as:  TYLENOL Place 650 mg into feeding tube every 4 (four) hours as needed for mild pain.   albuterol (2.5 MG/3ML) 0.083% nebulizer solution Commonly known as:  PROVENTIL Take 2.5 mg by nebulization every 4 (four) hours as needed for wheezing or shortness of breath.   ampicillin 2 g in sodium chloride 0.9 % 50 mL Inject 2 g into the vein every 4 (four) hours.   aspirin EC 81 MG tablet 81 mg daily. Via G-Tube   bisacodyl 10 MG suppository Commonly known as:  DULCOLAX Place 10 mg rectally daily as needed for moderate constipation.   buPROPion 75 MG tablet Commonly known as:  WELLBUTRIN Place 150 mg into feeding tube 2 (two) times daily.   cloNIDine 0.1 MG tablet Commonly known as:  CATAPRES Take 0.1 mg by mouth every 6 (six) hours as needed (blood pressure).   DECUBI-VITE Caps Take 1 capsule by mouth daily.   DEPAKENE PO Give 200 mg by tube every 8 (eight) hours.   feeding supplement (OSMOLITE 1.5 CAL) Liqd Place 1,000 mLs into feeding tube continuous. What changed:  how much to take  how to take this  when to take this   FOLBECAL 1 MG Tabs Give 1 tablet by tube daily.   ipratropium 0.02 % nebulizer solution Commonly known as:  ATROVENT Take 2.5 mg by nebulization every 4 (four) hours as needed for wheezing or shortness of breath.   LORazepam 0.5 MG tablet Commonly known as:  ATIVAN Take 0.5 mg by mouth every 8 (eight) hours as needed for anxiety.   magnesium hydroxide 400 MG/5ML suspension Commonly known as:  MILK  OF MAGNESIA Take 30 mLs by mouth daily as needed for mild constipation.   OxyCODONE HCl (Abuse Deter) 5 MG Taba Commonly known as:  OXAYDO Take 5 mg by mouth every 6 (six) hours as needed (pain).   polyethylene glycol packet Commonly known as:  MIRALAX / GLYCOLAX Place 17 g into feeding tube daily.   pravastatin 40 MG tablet Commonly known as:  PRAVACHOL Place 40 mg into feeding tube daily.   PROTONIX 40 mg/20 mL Pack Generic drug:  pantoprazole sodium Place 40 mg into feeding tube daily.       Discharge Assessment: Vitals:   05/20/16 2210 05/21/16 0509  BP: (!) 144/86 (!) 144/88  Pulse: 93 90  Resp: 18 18  Temp: 97.7 F (36.5 C) 98 F (36.7 C)   Skin clean, dry and intact without evidence of skin break down, no evidence of skin tears noted. IV catheter discontinued intact. Site without signs and symptoms of complications - no redness or edema noted at insertion site, patient denies c/o pain - only slight tenderness at site.  Dressing with slight pressure applied.  D/c Instructions-Education: Discharge instructions given to patient/family with verbalized understanding. D/c education completed with patient/family including follow up instructions, medication list, d/c activities limitations if indicated, with other d/c instructions as indicated by MD - patient able to verbalize understanding, all questions fully answered. Patient instructed to return to ED, call 911, or  call MD for any changes in condition.  Patient transported via [facility] to [facility] Report given to Nurse at [facility], RN 05/21/2016 1:02 PM

## 2016-05-21 NOTE — Progress Notes (Signed)
PICC line capped off, flushed with 10cc NS, GBR. Cassara Nida M  

## 2016-05-21 NOTE — Care Management Note (Signed)
Case Management Note  Patient Details  Name: Katherine Palmer MRN: [id number] Date of Birth: 07-20-1961  Subjective/Objective:                 [facility].    Action/Plan:  Will return to SNF at DC as facilitated by CSW.  Expected Discharge Date:                  Expected Discharge Plan:  Skilled Nursing Facility  In-House Referral:  Clinical Social Work  Discharge planning Services  CM Consult  Post Acute Care Choice:  NA Choice offered to:  NA  DME Arranged:  N/A DME Agency:  NA  HH Arranged:  NA HH Agency:  NA  Status of Service:  Completed, signed off  If discussed at Long Length of Stay Meetings, dates discussed:    Additional Comments:  Lawerance SabalDebbie Marielle Mantione, RN 05/21/2016, 9:05 AM

## 2016-05-21 NOTE — Consult Note (Addendum)
WOC follow-up: Refer to previous consult notes on 9/1. 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  Wounds are clean with mod amt yellow drainage, no odor; Dakins is no longer indicated.  Daily dressing changes can be performed by the bedside nurses using moist NS gauze and abd pads. Pt is on an air mattress for pressure reduction. Pt has multiple systemic factors which can impair wound healing.   Changed ostomy pouch, which was leaking behind the barrier.  Large amt liquid brown stool, 100cc in pouch.  Stoma is double-barreled, red and viable, above skin level, 1 3/4 inches.  Applied 2 piece pouch and supplies are at the bedside for staff nurse use. Please re-consult if further assistance is needed.  Thank-you,  Cammie Mcgeeawn Westen Dinino MSN, RN, CWOCN, UncertainWCN-AP, CNS [phone]

## 2016-05-21 NOTE — Progress Notes (Signed)
Speech Language Pathology Treatment: Dysphagia  Patient Details Name: Katherine AhmadiDiane Palmer MRN: [id number] DOB: 05-05-1961 Today's Date: 05/21/2016 Time: [id number] SLP Time Calculation (min) (ACUTE ONLY): 28 min  Assessment / Plan / Recommendation Clinical Impression  Note pt diet changed back to dys1/thin after RD saw her yesterday, per notes RD phoned SNF and daughters and pt was on pleasure feeds of dys1/thin.  SLP called RD to clarify = she reported changed to dys1/thin as this was pt's premorbid diet.   SLP observed pt consuming softened crackers, thin coffee and water via straw.  Again positioning compromised due to pt's discomfort, ? Contracture and decub.  She demonstrates adequate rotary "mastication movement" and no oral residuals.  No oral residuals noted with mild delayed oral manipulation and no indication of airway compromise.   Cough x1 with sequential liquid swallows - small single tolerated well.    To maximize pleasure - especially as pt is receiving all needed nutrition via PEG, recommend advance to dys3/puree meat/thin with full supervision. Using teach back, reviewed precautions with pt.  SLP to follow up.    HPI HPI: Mrs Katherine Palmer is a 55 year old with history of stage IV decubitus ulcer, dysphagia status post PEG tube placement, history of CVA, status post left above-the-knee amputation, status post colostomy placement, admitted to the pulmonary critical care service on 05/16/2016. She was found to be in respiratory distress having mental status changes at her skilled nursing facility for which she was transferred to the emergency department. CT of head without acute intracranial abnormality, though remote large right hemispheric infarction noted. Pt denies use of PEG for nutrition and states she eats what she wants.  Puree/thin diet started in hospital.  Pt does admit to occasional coughing with foods more than drinks.  Reports she does not wear dentures for po and recently lost them.         SLP Plan  Continue with current plan of care     Recommendations  Diet recommendations: Dysphagia 3 (mechanical soft);Thin liquid Liquids provided via: Cup;Straw Medication Administration: Whole meds with puree Supervision: Staff to assist with self feeding Compensations: Minimize environmental distractions;Slow rate;Small sips/bites;Other (Comment);Lingual sweep for clearance of pocketing (bring head neutral for maximum safety with po if not fully upright) Postural Changes and/or Swallow Maneuvers: Seated upright 90 degrees;Upright 30-60 min after meal             Oral Care Recommendations: Oral care BID Follow up Recommendations: Skilled Nursing facility Plan: Continue with current plan of care     GO               Katherine Burnetamara Edvin Albus, MS [facility] SLP [phone]

## 2016-05-21 NOTE — Discharge Summary (Addendum)
Physician Discharge Summary  Katherine Palmer ZOX:[id number] DOB: 1960/10/04 DOA: 05/16/2016  PCP: Katherine Boys, DO  Admit date: 05/16/2016 Discharge date: 05/21/2016  Time spent: 35 minutes  Recommendations for Outpatient Follow-up:  1. Blood cultures growing Enterococcus faecalis, was discharged on Ampicillin 2 g IV q 4 hours, anticipated stop date: 06/01/2016  2. Please remove PICC after discontinuation of antibiotic therapy 3. She was discharged to SNF on 05/21/2016   Discharge Diagnoses:  Active Problems:   S/P AKA (above knee amputation) (HCC)   Septic shock (HCC)   Acute encephalopathy   Lactic acidosis   Pressure ulcer   Palliative care encounter   Stage IV decubitus ulcer (HCC)   Acute respiratory failure (HCC)   Protein-calorie malnutrition, severe   Discharge Condition: Stable  Diet recommendation: SLP recommending Dysphagia 3 (mech soft) diet with tube feeds with Osmolite 1.5 60 mL/hour  Filed Weights   05/19/16 0300 05/20/16 0507 05/21/16 0509  Weight: 43.5 kg (96 lb) 44 kg (97 lb) 47.6 kg (105 lb)    History of present illness:  Katherine Palmer is a 55 y.o. 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  She was brought to [facility] ED 8/31 due to fever of 105F and AMS.  She had apparently been in her usual state of health earlier in the week and just one week prior had been feeding herself and talking with family.  On day of ED presentation, she was encephalopathic and not really verbal.  In ED, old stool was noted around foley site (of note pt has colostomy). Sacral decub wound dressings noted to be saturated in ED.  New right facial droop also noted per family, ? New CVA vs fever related.  In ED, she was found to be febrile to 105F and hypotensive with SBP's in the 80's.  She was given 1L IVF without any improvement.  She had minimal UOP in old foley but after this was replaced, she had some UOP after roughly 45 minutes.  UA  is pending.  CXR clear.  CVL was placed in ED and PCCM was called for admission.  Of note had recent admission to [facility] where she had diverting colostomy performed to allow sacral decub to heal. While at [facility], she also developed LLE gangrene for which she had left AKA.  Hospital Course:  Katherine Palmer is a 55 year old with history of stage IV decubitus ulcer, dysphagia status post PEG tube placement, history of CVA, status post left above-the-knee amputation, status post colostomy placement, admitted to the pulmonary critical care service on 05/16/2016. She was found to be in respiratory distress having mental status changes at her skilled nursing facility for which she was transferred to the emergency department. On presentation she was septic having a temperature of 105, systolic blood pressures in the 80s, white count of 16,100. Blood cultures growing enterococcus species 1 out of 2. Dr Luciana Axe of infectious disease was consulted. She was treated with IV Vancomycin. There was suspicion that source of infectious could be coming from wound although this did not appear to have evidence of active infection. Other potential sources could be GI and urinary tract. Transthoracic echocardiogram performed on 05/17/2016 did not reveal evidence of vegetation. Ejection fraction was 50-55% without wall motion abnormalities.                                1.  Septic shock -Evidence by patient having positive blood cultures growing enterococcus, temperature of 105, mental status changes, respiratory failure, hypotension, white count of 16,100, present on admission. -Source of infection could be from stage IV decubitus ulcer or perhaps she may have an intra-abdominal source or urinary tract infection. -Plan to continue IV antibiotic therapy with vancomycin -Remains hemodynamically stable now and showing clinical improvement since presentation. -On 05/19/2016 microbiology reporting Enterococcus Faecalis  organism sensitive to Ampicillin, Gentamicin and Vancomycin. Stopped Vancomycin and narrowed antimicrobial spectrum to IV Ampicillin. Case discussed with Dr Luciana Axe of ID. Repeat blood cultures were drawn on 05/19/2016 that showed no growth to date. A PICC line was placed on 9/4/2017and will plan to treat for a total of 14 days of IV antibiotic therapy with anticipated stop date: 06/01/2016  2.  History of stage IV decubitus ulcer -Patient having poor functional status at baseline, bed-bound having a history of CVA and status post left above-the-knee amputation -Wound care consulted -On exam wound appears beefy red, bone is visible, did not appreciate purulence -Continue wound care  3. Acute respiratory failure -At her facility she was found to be in respiratory distress, having labored respirations, mental status changes, likely secondary to sepsis/critifal illness.   4.   History of CVA with residual left-sided hemiparesis -Status post PEG tube placement -Per outpatient notes from [place] [facility] has pleasure foods however relies on PEG tube for nutritional support, no signs of aspiration have been present. -SLP consulted, diet advanced to Dysphagia 3  5.  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 Anemia -Hg trending down to 7.1 however, there is no evidence of GI bleed. - 05/19/2016: Will transfuse 1 unit of PRBC's today -Suspect secondary to anemia of chronic disease -Repeat lab work on 05/21/2016 showing hemoglobin of 8.1.  7.  Hypokalemia -AM laboratory potassium 3.2, will replace with potassium chloride 40 mEq per tube -Replaced with oral potassium  8.  Nutrition -Case discussed with Kirkland Hun RD, who recommended initiating Osmolite 1.5 at 20 ML's per hour with goal to increase by 10 ML's every 4 hours for a goal rate of 60 ML's per hour.   Procedures:  TTE performed on 05/17/2016 IMPRESSION:  - Left ventricle: Inferior wall  hypokinesis The cavity size was normal. Systolic function was normal. The estimated ejection fraction was in the range of 50% to 55%. Wall motion was normal; there were no regional wall motion abnormalities. Left ventricular diastolic function parameters were normal. - Mitral valve: There was mild regurgitation. - Atrial septum: No defect or patent foramen ovale was identified.   PICC line placed on 05/20/2016  Consultations:  PCCM  ID  Discharge Exam: Vitals:   05/20/16 2210 05/21/16 0509  BP: (!) 144/86 (!) 144/88  Pulse: 93 90  Resp: 18 18  Temp: 97.7 F (36.5 C) 98 F (36.7 C)   General exam: She is in no acute distress awake alert, pleasant Respiratory system: Clear to auscultation. Respiratory effort normal. Cardiovascular system: S1 & S2 heard, RRR. No JVD, murmurs, rubs, gallops or clicks. No pedal edema. Gastrointestinal system: Abdomen is nondistended, soft and nontender. No organomegaly or masses felt. Normal bowel sounds heard. Status post PEG tube placement Central nervous system: Alert and oriented. No focal neurological deficits. Extremities: S/p left above the knee amputation Skin: There is a large stage IV decubitus ulcer, appears beefy red, bone was visible, I did not appreciate purulence, fluctuance or evidence of active infection.  Psychiatry: Confused  however no acute distress  Discharge Instructions   Discharge Instructions    Call MD for:    Complete by:  As directed   Call MD for:  difficulty breathing, headache or visual disturbances    Complete by:  As directed   Call MD for:  extreme fatigue    Complete by:  As directed   Call MD for:  hives    Complete by:  As directed   Call MD for:  persistant dizziness or light-headedness    Complete by:  As directed   Call MD for:  persistant nausea and vomiting    Complete by:  As directed   Call MD for:  redness, tenderness, or signs of infection (pain, swelling, redness, odor or green/yellow  discharge around incision site)    Complete by:  As directed   Call MD for:  severe uncontrolled pain    Complete by:  As directed   Call MD for:  temperature >100.4    Complete by:  As directed   Diet - low sodium heart healthy    Complete by:  As directed   Increase activity slowly    Complete by:  As directed     Current Discharge Medication List    START taking these medications   Details  ampicillin 2 g in sodium chloride 0.9 % 50 mL Inject 2 g into the vein every 4 (four) hours. Qty: 1 Bottle, Refills: 0      CONTINUE these medications which have CHANGED   Details  Nutritional Supplements (FEEDING SUPPLEMENT, OSMOLITE 1.5 CAL,) LIQD Place 1,000 mLs into feeding tube continuous. Qty: 1 Bottle, Refills: 0      CONTINUE these medications which have NOT CHANGED   Details  acetaminophen (TYLENOL) 325 MG tablet Place 650 mg into feeding tube every 4 (four) hours as needed for mild pain.     albuterol (PROVENTIL) (2.5 MG/3ML) 0.083% nebulizer solution Take 2.5 mg by nebulization every 4 (four) hours as needed for wheezing or shortness of breath.    aspirin EC 81 MG tablet 81 mg daily. Via G-Tube    bisacodyl (DULCOLAX) 10 MG suppository Place 10 mg rectally daily as needed for moderate constipation.    buPROPion (WELLBUTRIN) 75 MG tablet Place 150 mg into feeding tube 2 (two) times daily.    cloNIDine (CATAPRES) 0.1 MG tablet Take 0.1 mg by mouth every 6 (six) hours as needed (blood pressure).     ipratropium (ATROVENT) 0.02 % nebulizer solution Take 2.5 mg by nebulization every 4 (four) hours as needed for wheezing or shortness of breath.    LORazepam (ATIVAN) 0.5 MG tablet Take 0.5 mg by mouth every 8 (eight) hours as needed for anxiety.    magnesium hydroxide (MILK OF MAGNESIA) 400 MG/5ML suspension Take 30 mLs by mouth daily as needed for mild constipation.    Multiple Vitamins-Minerals (DECUBI-VITE) CAPS Take 1 capsule by mouth daily.    OxyCODONE HCl, Abuse Deter,  (OXAYDO) 5 MG TABA Take 5 mg by mouth every 6 (six) hours as needed (pain).     pantoprazole sodium (PROTONIX) 40 mg/20 mL PACK Place 40 mg into feeding tube daily.    polyethylene glycol (MIRALAX / GLYCOLAX) packet Place 17 g into feeding tube daily.    pravastatin (PRAVACHOL) 40 MG tablet Place 40 mg into feeding tube daily.    Prenatal Ca Carb-B6-B12-FA (FOLBECAL) 1 MG TABS Give 1 tablet by tube daily.    Valproate Sodium (DEPAKENE PO) Give 200 mg  by tube every 8 (eight) hours.      STOP taking these medications     FA-B6-B12-ARGININE-BLACKPEPPER PO        No Known Allergies Follow-up Information    Katherine Boys, DO Follow up in 1 week(s).   Specialty:  Internal Medicine Contact information: [address] [place] [address] [phone]            The results of significant diagnostics from this hospitalization (including imaging, microbiology, ancillary and laboratory) are listed below for reference.    Significant Diagnostic Studies: Ct Head Wo Contrast  Result Date: 05/16/2016 CLINICAL DATA:  Fever. EXAM: CT HEAD WITHOUT CONTRAST TECHNIQUE: Contiguous axial images were obtained from the base of the skull through the vertex without intravenous contrast. COMPARISON:  None. FINDINGS: Brain: Evidence of a prior large right-sided hemispheric infarction with extensive encephalomalacia and ex vacuo dilatation of the right lateral ventricle. Large areas of calcification likely due to calcified hematoma. No CT findings for new/acute process. No definite findings for a left-sided infarct. No extra-axial fluid collections. The brainstem and cerebellum are grossly normal. Vascular: Vascular calcifications without obvious aneurysm. Skull: No skull fracture for bone lesion. Sinuses/Orbits: The paranasal sinuses and mastoid air cells are grossly clear. The globes are intact. Other: No scalp hematoma or other soft tissue abnormality is identified. IMPRESSION: 1. Remote large  right hemispheric infarction with extensive encephalomalacia and dystrophic calcifications. 2. No definite acute intracranial process. Electronically Signed   By: Rudie Meyer M.D.   On: 05/16/2016 17:27   Dg Chest Port 1 View  Result Date: 05/16/2016 CLINICAL DATA:  LEFT-sided central line placement. EXAM: PORTABLE CHEST 1 VIEW COMPARISON:  Chest radiograph May 16, 2016 at 1303 hours FINDINGS: Interval placement of LEFT subclavian central venous catheter with distal tip projecting in mid superior vena caval. No pneumothorax. Cardiac silhouette is mildly enlarged, mediastinal silhouette is nonsuspicious, mildly calcified aortic arch. Patchy bibasilar airspace opacities. No pleural effusion or focal consolidation. Old RIGHT rib fractures. Single thoracolumbar Harrington rods. Coarse symmetric calcifications in the neck are likely vascular. IMPRESSION: New LEFT subclavian central venous catheter with distal tip projecting in mid superior vena cava. No pneumothorax. Mild cardiomegaly.  Bibasilar atelectasis, less likely pneumonia. Electronically Signed   By: Awilda Metro M.D.   On: 05/16/2016 16:10   Dg Chest Portable 1 View  Result Date: 05/16/2016 CLINICAL DATA:  55 year old presenting with acute mental status changes. Personal history of stroke in seizures. Current history of hypertension and sacral decubitus ulcer. EXAM: PORTABLE CHEST 1 VIEW COMPARISON:  None. FINDINGS: Cardiac silhouette upper normal in size for AP portable technique. Lungs clear. Pulmonary vascularity normal. No visible pleural effusions. Old healed right rib fractures. Harrington rod traverses the thoracic and upper lumbar spine. IMPRESSION: No acute cardiopulmonary disease. Electronically Signed   By: Hulan Saas M.D.   On: 05/16/2016 13:17    Microbiology: Recent Results (from the past 240 hour(s))  Culture, blood (Routine x 2)     Status: None (Preliminary result)   Collection Time: 05/16/16 12:33 PM  Result  Value Ref Range Status   Specimen Description BLOOD LEFT FOREARM  Final   Special Requests IN PEDIATRIC BOTTLE 1CC  Final   Culture NO GROWTH 4 DAYS  Final   Report Status PENDING  Incomplete  Culture, blood (Routine x 2)     Status: None (Preliminary result)   Collection Time: 05/16/16  2:05 PM  Result Value Ref Range Status   Specimen Description BLOOD RIGHT FOREARM  Final   Special Requests BOTTLES DRAWN AEROBIC AND ANAEROBIC 10CC  Final   Culture NO GROWTH 4 DAYS  Final   Report Status PENDING  Incomplete  C difficile quick scan w PCR reflex     Status: None   Collection Time: 05/16/16  4:51 PM  Result Value Ref Range Status   C Diff antigen NEGATIVE NEGATIVE Final   C Diff toxin NEGATIVE NEGATIVE Final   C Diff interpretation No C. difficile detected.  Final  Urine culture     Status: Abnormal   Collection Time: 05/16/16  4:52 PM  Result Value Ref Range Status   Specimen Description URINE, RANDOM  Final   Special Requests NONE  Final   Culture (A)  Final    20,000 COLONIES/mL ESCHERICHIA COLI Confirmed Extended Spectrum Beta-Lactamase Producer (ESBL)    Report Status 05/18/2016 FINAL  Final   Organism ID, Bacteria ESCHERICHIA COLI (A)  Final      Susceptibility   Escherichia coli - MIC*    AMPICILLIN >=32 RESISTANT Resistant     CEFAZOLIN >=64 RESISTANT Resistant     CEFTRIAXONE >=64 RESISTANT Resistant     CIPROFLOXACIN >=4 RESISTANT Resistant     GENTAMICIN <=1 SENSITIVE Sensitive     IMIPENEM <=0.25 SENSITIVE Sensitive     NITROFURANTOIN <=16 SENSITIVE Sensitive     TRIMETH/SULFA <=20 SENSITIVE Sensitive     AMPICILLIN/SULBACTAM >=32 RESISTANT Resistant     PIP/TAZO 8 SENSITIVE Sensitive     Extended ESBL POSITIVE Resistant     * 20,000 COLONIES/mL ESCHERICHIA COLI  Culture, blood (Routine X 2) w Reflex to ID Panel     Status: None (Preliminary result)   Collection Time: 05/16/16  5:13 PM  Result Value Ref Range Status   Specimen Description BLOOD A-LINE  Final    Special Requests BOTTLES DRAWN AEROBIC AND ANAEROBIC 5CC   Final   Culture NO GROWTH 4 DAYS  Final   Report Status PENDING  Incomplete  Culture, blood (Routine X 2) w Reflex to ID Panel     Status: Abnormal   Collection Time: 05/16/16  5:21 PM  Result Value Ref Range Status   Specimen Description BLOOD BLOOD LEFT FOREARM  Final   Special Requests IN PEDIATRIC BOTTLE 2CC  Final   Culture  Setup Time   Final    GRAM POSITIVE COCCI IN PAIRS AND CHAINS AEROBIC BOTTLE ONLY CRITICAL RESULT CALLED TO, READ BACK BY AND VERIFIED WITH: J FRENS,PHARMD AT 1030 05/17/16 BY L BENFIELD    Culture (A)  Final    ENTEROCOCCUS FAECALIS STAPHYLOCOCCUS SPECIES (COAGULASE NEGATIVE) THE SIGNIFICANCE OF ISOLATING THIS ORGANISM FROM A SINGLE SET OF BLOOD CULTURES WHEN MULTIPLE SETS ARE DRAWN IS UNCERTAIN. PLEASE NOTIFY THE MICROBIOLOGY DEPARTMENT WITHIN ONE WEEK IF SPECIATION AND SENSITIVITIES ARE REQUIRED.    Report Status 05/20/2016 FINAL  Final   Organism ID, Bacteria ENTEROCOCCUS FAECALIS  Final      Susceptibility   Enterococcus faecalis - MIC*    AMPICILLIN <=2 SENSITIVE Sensitive     VANCOMYCIN 2 SENSITIVE Sensitive     GENTAMICIN SYNERGY SENSITIVE Sensitive     * ENTEROCOCCUS FAECALIS  Blood Culture ID Panel (Reflexed)     Status: Abnormal   Collection Time: 05/16/16  5:21 PM  Result Value Ref Range Status   Enterococcus species DETECTED (A) NOT DETECTED Final    Comment: CRITICAL RESULT CALLED TO, READ BACK BY AND VERIFIED WITH: J FRENS,PHARMD AT 1030 05/17/16 BY L BENFIELD  Vancomycin resistance NOT DETECTED NOT DETECTED Final   Listeria monocytogenes NOT DETECTED NOT DETECTED Final   Staphylococcus species NOT DETECTED NOT DETECTED Final   Staphylococcus aureus NOT DETECTED NOT DETECTED Final   Streptococcus species NOT DETECTED NOT DETECTED Final   Streptococcus agalactiae NOT DETECTED NOT DETECTED Final   Streptococcus pneumoniae NOT DETECTED NOT DETECTED Final   Streptococcus pyogenes  NOT DETECTED NOT DETECTED Final   Acinetobacter baumannii NOT DETECTED NOT DETECTED Final   Enterobacteriaceae species NOT DETECTED NOT DETECTED Final   Enterobacter cloacae complex NOT DETECTED NOT DETECTED Final   Escherichia coli NOT DETECTED NOT DETECTED Final   Klebsiella oxytoca NOT DETECTED NOT DETECTED Final   Klebsiella pneumoniae NOT DETECTED NOT DETECTED Final   Proteus species NOT DETECTED NOT DETECTED Final   Serratia marcescens NOT DETECTED NOT DETECTED Final   Haemophilus influenzae NOT DETECTED NOT DETECTED Final   Neisseria meningitidis NOT DETECTED NOT DETECTED Final   Pseudomonas aeruginosa NOT DETECTED NOT DETECTED Final   Candida albicans NOT DETECTED NOT DETECTED Final   Candida glabrata NOT DETECTED NOT DETECTED Final   Candida krusei NOT DETECTED NOT DETECTED Final   Candida parapsilosis NOT DETECTED NOT DETECTED Final   Candida tropicalis NOT DETECTED NOT DETECTED Final  MRSA PCR Screening     Status: None   Collection Time: 05/16/16  6:09 PM  Result Value Ref Range Status   MRSA by PCR NEGATIVE NEGATIVE Final    Comment:        The GeneXpert MRSA Assay (FDA approved for NASAL specimens only), is one component of a comprehensive MRSA colonization surveillance program. It is not intended to diagnose MRSA infection nor to guide or monitor treatment for MRSA infections.   Culture, blood (routine x 2)     Status: None (Preliminary result)   Collection Time: 05/19/16  5:42 AM  Result Value Ref Range Status   Specimen Description BLOOD LEFT HAND  Final   Special Requests IN PEDIATRIC BOTTLE 1CC  Final   Culture NO GROWTH 1 DAY  Final   Report Status PENDING  Incomplete  Culture, blood (routine x 2)     Status: None (Preliminary result)   Collection Time: 05/19/16  6:12 PM  Result Value Ref Range Status   Specimen Description BLOOD LEFT HAND  Final   Special Requests BOTTLES DRAWN AEROBIC ONLY 7CC  Final   Culture NO GROWTH 1 DAY  Final   Report Status  PENDING  Incomplete     Labs: Basic Metabolic Panel:  Recent Labs Lab 05/17/16 0352  05/18/16 0800 05/18/16 1200 05/19/16 0429 05/20/16 0414 05/21/16 0404  NA 152*  < > 144 141 141 145 143  K 3.3*  < > 3.7 3.6 3.5 3.2* 3.2*  CL 127*  < > 119* 118* 116* 118* 114*  CO2 21*  < > 20* 19* 20* 21* 23  GLUCOSE 92  < > 79 88 96 101* 108*  BUN 39*  < > 16 15 10 11 8   CREATININE <0.30*  < > <0.30* <0.30* <0.30* 0.36* 0.38*  CALCIUM 7.9*  < > 8.7* 8.6* 8.5* 8.4* 8.4*  MG 2.0  --   --   --   --   --   --   PHOS 2.7  --   --   --   --   --   --   < > = values in this interval not displayed. Liver Function Tests:  Recent Labs Lab 05/16/16 1233  AST 46*  ALT 31  ALKPHOS 80  BILITOT 0.7  PROT 8.2*  ALBUMIN 1.9*   No results for input(s): LIPASE, AMYLASE in the last 168 hours. No results for input(s): AMMONIA in the last 168 hours. CBC:  Recent Labs Lab 05/16/16 1233 05/17/16 0352 05/19/16 0429 05/20/16 0414 05/21/16 0404  WBC 16.1* 16.1* 7.7 7.5 6.5  NEUTROABS 12.1*  --   --   --   --   HGB 10.7* 7.1* 7.1* 8.6* 8.1*  HCT 37.8 25.1* 24.5* 29.1* 28.0*  MCV 99.7 98.8 95.0 91.2 92.1  PLT 478* 382 362 344 364   Cardiac Enzymes:  Recent Labs Lab 05/16/16 1713 05/16/16 2152 05/17/16 0352  TROPONINI 0.05* 0.06* 0.05*   BNP: BNP (last 3 results) No results for input(s): BNP in the last 8760 hours.  ProBNP (last 3 results) No results for input(s): PROBNP in the last 8760 hours.  CBG:  Recent Labs Lab 05/20/16 0810 05/20/16 2023 05/20/16 2356 05/21/16 0410 05/21/16 0752  GLUCAP 101* 96 144* 99 128*       Signed:  Jeralyn Bennett MD.  [facility] 05/21/2016, 11:09 AM

## 2016-05-21 NOTE — Progress Notes (Signed)
Daily Progress Note   Patient Name: Katherine Palmer       Date: 05/21/2016 DOB: 1961/02/17  Age: 55 y.o. MRN#: [id number] Attending Physician: Kelvin Cellar, MD Primary Care Physician: Gildardo Cranker, DO Admit Date: 05/16/2016  Reason for Consultation/Follow-up: Establishing goals of care  Subjective: Met with patient this morning. She remains a little confused. She was unable to lift her cup of water. Not knowing where she is, asking me for her cigarettes, but able to converse appropriately. She is tired of everyone asking her if she likes where she lives. When I asked her what she enjoys in life, she was unable to answer me. I asked her if her heart stopped, and she stopped breathing, would she want [place] to do CPR and bring her back to life. She very clearly stated, "No, I have seen what's on this side, and I'm ready to see what's on the other. That doesn't mean I want to die, but I'm ready when its my time." I discussed this conversation with [place] and [place]. They agree it would be best to change patient's code status to DNR. However, they want to talk to their two other siblings first and will call me back.    Length of Stay: 5  Current Medications: Scheduled Meds:  . ampicillin (OMNIPEN) IV  2 g Intravenous Q4H  . aspirin  81 mg Per Tube Daily  . buPROPion  75 mg Per Tube BID  . chlorhexidine  15 mL Mouth Rinse BID  . famotidine  20 mg Oral Daily  . feeding supplement (PRO-STAT SUGAR FREE 64)  30 mL Per Tube BID  . heparin  5,000 Units Subcutaneous Q8H  . insulin aspart  0-9 Units Subcutaneous Q4H  . mouth rinse  15 mL Mouth Rinse q12n4p  . potassium chloride  40 mEq Per Tube Q4H  . sodium hypochlorite   Irrigation BID  . Valproate Sodium  200 mg Oral Q8H    Continuous  Infusions: . feeding supplement (OSMOLITE 1.5 CAL) 1,000 mL (05/20/16 1845)    PRN Meds: acetaminophen, ipratropium-albuterol, oxyCODONE, sodium chloride flush, sodium chloride flush  Physical Exam  Constitutional:  Frail, appears older than stated age  HENT:  Head: Normocephalic and atraumatic.  Pulmonary/Chest: Effort normal and breath sounds normal.  Abdominal: Soft.  PEG with continuous feeding in place  Musculoskeletal:  L BKA, left hemiplegia, generalized weakness, contracted  Neurological: She is alert.  Oriented to person only  Skin: Skin is warm and dry.  Psychiatric:  Flat affect, depressed            Vital Signs: BP (!) 144/88 (BP Location: Left Arm)   Pulse 90   Temp 98 F (36.7 C) (Oral)   Resp 18   Wt 47.6 kg (105 lb)   SpO2 100%   BMI 18.60 kg/m  SpO2: SpO2: 100 % O2 Device: O2 Device: Not Delivered O2 Flow Rate:    Intake/output summary:  Intake/Output Summary (Last 24 hours) at 05/21/16 1017 Last data filed at 05/21/16 0720  Gross per 24 hour  Intake             1264 ml  Output             2000 ml  Net             -736 ml   LBM: Last BM Date: 05/20/16 (Ostomy) Baseline Weight: Weight: 46.5 kg (102 lb 9 oz) Most recent weight: Weight: 47.6 kg (105 lb)       Palliative Assessment/Data:PPS: 20%   Flowsheet Rows   Flowsheet Row Most Recent Value  Intake Tab  Referral Department  Hospitalist  Unit at Time of Referral  Med/Surg Unit  Palliative Care Primary Diagnosis  Sepsis/Infectious Disease  Date Notified  05/18/16  Palliative Care Type  New Palliative care  Reason for referral  Clarify Goals of Care  Date of Admission  05/16/16  Date first seen by Palliative Care  05/19/16  # of days Palliative referral response time  1 Day(s)  # of days IP prior to Palliative referral  2  Clinical Assessment  Palliative Performance Scale Score  30%  Pain Max last 24 hours  Not able to report  Pain Min Last 24 hours  Not able to report  Dyspnea Max  Last 24 Hours  Not able to report  Dyspnea Min Last 24 hours  Not able to report  Nausea Max Last 24 Hours  Not able to report  Nausea Min Last 24 Hours  Not able to report  Anxiety Max Last 24 Hours  Not able to report  Anxiety Min Last 24 Hours  Not able to report  Other Max Last 24 Hours  Not able to report  Psychosocial & Spiritual Assessment  Palliative Care Outcomes  Patient/Family meeting held?  Yes  Who was at the meeting?  2 of 4 children,  daughters Auburn Bilberry and [place] follow-up planned  Yes, Facility      Patient Active Problem List   Diagnosis Date Noted  . Stage IV decubitus ulcer ([place]) 05/18/2016  . Acute respiratory failure ([place]) 05/18/2016  . Palliative care encounter   . Pressure ulcer 05/17/2016  . Acute respiratory distress (H[facility]) 05/16/2016  . Altered mental status 05/16/2016  . Septic shock ([place]) 05/16/2016  . Severe sepsis ([place]) 05/16/2016  . Lactic acidosis 05/16/2016  . Encounter for central line placement   . Hypovolemic shock ([place])   . Hypernatremia   . Acute encephalopathy   . Cerebral infarction due to unspecified mechanism   . Hx of AKA (above knee amputation) ([place])   . Sepsis ([place]) 05/14/2016  . Leukocytosis 05/13/2016  . CVA (cerebral vascular accident) ([place]) 04/11/2016  . Hemiparesis affecting left side as late effect of cerebrovascular accident (CVA) ([place]) 04/11/2016  . Essential hypertension, benign 04/11/2016  .  Sacral decubitus ulcer 04/11/2016  . Failure to thrive syndrome, adult 04/11/2016  . Dysphagia due to old cerebrovascular accident 04/11/2016  . Depression with anxiety 04/11/2016  . Colostomy present ([place]) 04/11/2016  . S/P AKA (above knee amputation) ([place]) 04/11/2016  . Anemia of chronic disease 04/11/2016  . Smoker 04/11/2016    Palliative Care Assessment & Plan   Patient Profile: 55 y.o. 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 She was started on antibiotics as well as fluid resuscitation. Her chest x-ray was clear. Her hemoglobin on 05/16/2016 was 10.3. On 05/19/2016 it is now 7.1. No obvious signs of bleeding. Met with 2 of her 4 children, daughters Auburn Bilberry and  Faye Ramsay for goals of care discussion. They share with me that this is their mother's third hospitalization in 2017.   Assessment: Pleasant 55 yo lady who appears depressed. She desires no code status, but will have to confirm with daughters before changing status due to patient having some confusion.   Recommendations/Plan:  Continue full scope of care for now, anticipate transition to DNR  Will continue [place] conversation with daughters as patient's hospitalization allows, recommend f/u with Palliative Services at discharge to continue [place] and Additional Recommendations:  Limitations on Scope of Treatment: Full Scope Treatment  Code Status:    Code Status Orders        Start     Ordered   05/16/16 1552  Full code  Continuous     05/16/16 1551    Code Status History    Date Active Date Inactive Code Status Order ID Comments User Context   This patient has a current code status but no historical code status.       Prognosis:   < 3 months aeb by declining mental status, function and likelihood of recurrent infections  Discharge Planning:  [facility] with Hospice  Care plan was discussed with daughters Auburn Bilberry and [place].  Thank you for allowing the Palliative Medicine Team to assist in the care of this patient.   Time In: 0900 Time Out: 1000 Total Time 60 min Prolonged Time Billed no      Greater than 50%  of this time was spent counseling and coordinating care related to the above assessment and plan.  Payton Emerald, NP  Please contact  Palliative Medicine Team phone at [phone] for questions and concerns.

## 2016-05-22 DIAGNOSIS — Z515 Encounter for palliative care: Secondary | ICD-10-CM

## 2016-05-22 DIAGNOSIS — Z7189 Other specified counseling: Secondary | ICD-10-CM

## 2016-05-23 ENCOUNTER — Non-Acute Institutional Stay (SKILLED_NURSING_FACILITY): Payer: Medicaid Other | Admitting: Adult Health

## 2016-05-23 ENCOUNTER — Encounter: Payer: Self-pay | Admitting: Adult Health

## 2016-05-23 ENCOUNTER — Other Ambulatory Visit: Payer: Self-pay

## 2016-05-23 DIAGNOSIS — I639 Cerebral infarction, unspecified: Secondary | ICD-10-CM

## 2016-05-23 DIAGNOSIS — Z933 Colostomy status: Secondary | ICD-10-CM

## 2016-05-23 DIAGNOSIS — D638 Anemia in other chronic diseases classified elsewhere: Secondary | ICD-10-CM

## 2016-05-23 DIAGNOSIS — F418 Other specified anxiety disorders: Secondary | ICD-10-CM

## 2016-05-23 DIAGNOSIS — L8994 Pressure ulcer of unspecified site, stage 4: Secondary | ICD-10-CM | POA: Diagnosis not present

## 2016-05-23 DIAGNOSIS — I69391 Dysphagia following cerebral infarction: Secondary | ICD-10-CM | POA: Diagnosis not present

## 2016-05-23 DIAGNOSIS — R6521 Severe sepsis with septic shock: Secondary | ICD-10-CM

## 2016-05-23 DIAGNOSIS — R627 Adult failure to thrive: Secondary | ICD-10-CM

## 2016-05-23 DIAGNOSIS — I10 Essential (primary) hypertension: Secondary | ICD-10-CM

## 2016-05-23 DIAGNOSIS — Z89612 Acquired absence of left leg above knee: Secondary | ICD-10-CM | POA: Diagnosis not present

## 2016-05-23 DIAGNOSIS — A419 Sepsis, unspecified organism: Secondary | ICD-10-CM | POA: Diagnosis not present

## 2016-05-23 MED ORDER — LORAZEPAM 0.5 MG PO TABS: 0.5000 mg | ORAL_TABLET | Freq: Three times a day (TID) | ORAL | 5 refills | Status: DC | PRN

## 2016-05-23 MED ORDER — OXYCODONE HCL 5 MG PO TABA: 5.0000 mg | ORAL_TABLET | Freq: Four times a day (QID) | ORAL | 0 refills | Status: DC | PRN

## 2016-05-23 NOTE — Telephone Encounter (Signed)
RX faxed to [facility] @ [phone], phone number [phone] 

## 2016-05-23 NOTE — Progress Notes (Signed)
Patient ID: Katherine Palmer, female   DOB: 1961/05/28, 55 y.o.   MRN: [id number]   Location:   [facility] Room Number: 109-A Place of Service:  SNF (31)   CODE STATUS: Full Code  No Known Allergies  Chief Complaint  Patient presents with  . Medical Management of Chronic Issues    Follow up    HPI:  She is a resident of this facility who was hospitalized for septic shock with no definite source found; her urine culture grew 20,000 colonies; her wound does not demonstrate signs of infection; her blood culture was positive for enterococcus faecalis. She did receive one unit prbc's while hospitalized. She is unable to fully participate in the hpi or ros. There are no nursing concerns at this time.    Past Medical History:  Diagnosis Date  . Decubitus ulcer of sacral region, stage 4 (HCC)   . Dysphagia   . Dysphagia as late effect of cerebrovascular disease   . Hyperlipidemia   . Hypertension   . Seizures (HCC)   . Stroke [facility])     Past Surgical History:  Procedure Laterality Date  . 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 COLOSTOMY    . PEG TUBE PLACEMENT      Social History   Social History  . Marital status: Divorced    Spouse name: N/A  . Number of children: N/A  . Years of education: N/A   Occupational History  . Not on file.   Social History Main Topics  . Smoking status: Former Games developer  . Smokeless tobacco: Never Used  . Alcohol use Not on file  . Drug use: Unknown  . Sexual activity: No   Other Topics Concern  . Not on file   Social History Narrative  . No narrative on file   Family History  Problem Relation Age of Onset  . Family history unknown: Yes      VITAL SIGNS BP 140/76   Pulse 78   Temp 98.2 F (36.8 C) (Oral)   Resp 19   Ht 5\' 3"  (1.6 m)   Wt 102 lb 9 oz (46.5 kg)   SpO2 (!) 88%   BMI 18.17 kg/m   Patient's Medications  New Prescriptions   No medications on file  Previous Medications   ACETAMINOPHEN  (TYLENOL) 325 MG TABLET    Place 650 mg into feeding tube every 4 (four) hours as needed for mild pain.    ALBUTEROL (PROVENTIL) (2.5 MG/3ML) 0.083% NEBULIZER SOLUTION    Take 2.5 mg by nebulization every 4 (four) hours as needed for wheezing or shortness of breath.   AMPICILLIN 2 G IN SODIUM CHLORIDE 0.9 % 50 ML    Inject 2 g into the vein every 4 (four) hours.   ASPIRIN EC 81 MG TABLET    81 mg daily. Via G-Tube   BISACODYL (DULCOLAX) 10 MG SUPPOSITORY    Place 10 mg rectally daily as needed for moderate constipation.   BUPROPION (WELLBUTRIN) 75 MG TABLET    Place 150 mg into feeding tube 2 (two) times daily.   CLONIDINE (CATAPRES) 0.1 MG TABLET    Take 0.1 mg by mouth every 6 (six) hours as needed (blood pressure).    COLLAGENASE (SANTYL) OINTMENT    Apply 1 application topically daily. Left breast   IPRATROPIUM (ATROVENT) 0.02 % NEBULIZER SOLUTION    Take 2.5 mg by nebulization every 4 (four) hours as needed for wheezing or shortness of breath.  LORAZEPAM (ATIVAN) 0.5 MG TABLET    Take 0.5 mg by mouth every 8 (eight) hours as needed for anxiety.   MAGNESIUM HYDROXIDE (MILK OF MAGNESIA) 400 MG/5ML SUSPENSION    Take 30 mLs by mouth daily as needed for mild constipation.   MULTIPLE VITAMINS-MINERALS (DECUBI-VITE) CAPS    Take 1 capsule by mouth daily.   NUTRITIONAL SUPPLEMENTS (FEEDING SUPPLEMENT, OSMOLITE 1.5 CAL,) LIQD    Place 1,000 mLs into feeding tube continuous.   OXYCODONE HCL, ABUSE DETER, (OXAYDO) 5 MG TABA    Take 5 mg by mouth every 6 (six) hours as needed (pain).   PANTOPRAZOLE SODIUM (PROTONIX) 40 MG/20 ML PACK    Place 40 mg into feeding tube daily.   POLYETHYLENE GLYCOL (MIRALAX / GLYCOLAX) PACKET    Place 17 g into feeding tube daily.   PRAVASTATIN (PRAVACHOL) 40 MG TABLET    Place 40 mg into feeding tube daily.   PRENATAL CA CARB-B6-B12-FA (FOLBECAL) 1 MG TABS    Give 1 tablet by tube daily.   SODIUM CHLORIDE FLUSH (NORMAL SALINE FLUSH) 0.9 % SOLN    Inject 10 mLs into the vein  every 4 (four) hours.   VALPROATE SODIUM (DEPAKENE PO)    Give 200 mg by tube every 8 (eight) hours.  Modified Medications   No medications on file  Discontinued Medications   No medications on file     SIGNIFICANT DIAGNOSTIC EXAMS  03-28-16: chest x-ray: 1. Improvement in lung aeration since prior study with a decrease in pleural effusions. There is central vascular congestion but no convincing pulmonary edema.   04-23-16: 2-d echo: 1. technically difficult study with limited views 2. Normal appearing left ventricular systolic function. EF 55-60% 3. Mild sclerotic aortic valve no significant stenosis by velocity 4. Mild concentric left ventricular hypertrophy 5. Moderate mitral regurgitation 6. Mild tricuspid regurgitation 7. Highly mobile interatrial septum with a suggestion of a septal defect. However, the septum and the valvular structures are not well visualized to determine if there is a defect or evidence of vegetations. 8. Recommend transesophageal echocardiogram for better visualization of te left atrial septum in addition to better visualize the valvular structures given the patient's history   05-16-16: chest x-ray; No acute cardiopulmonary disease.  05-16-16: ct of head: 1. Remote large right hemispheric infarction with extensive encephalomalacia and dystrophic calcifications. 2. No definite acute intracranial process.   05-17-16: TEE: - Left ventricle: Inferior wall hypokinesis The cavity size was normal. Systolic function was normal. The estimated ejection fraction was in the range of 50% to 55%. Wall motion was normal; there were no regional wall motion abnormalities. Left ventricular diastolic function parameters were normal. - Mitral valve: There was mild regurgitation. - Atrial septum: No defect or patent foramen ovale was identified.     LABS REVIEWED:   04-08-16: wbc 9.7; hgb 8.9; hct 26.5; plt 330; glucose 122; bun 11; creat 0.21; k+ 4.1; na++ 128; liver normal albumin  2.2  04-12-16: wbc 15.0; hgb 8.8; hct 26.6; mcv 94.0; plt 386; glucose 125; bun 11; creat 0.21; k+ 4.0; na++ 132; liver normal albumin 2.0; chol 77; ldl 35; trig 81; hdl 26; iron 10; tibc 23; ferritin 471; depakote 15.4; hgb a1c 5.7; abs neut: 8400; abs mono 3750; abs baso 0  05-16-16: wbc 16.1; hgb 10.7; hct 37.8; mcv 99.7; plt 478; glucose 128; bun 58; creat 0.58; k+ 4.8; na++ 151; ast 46; albumin 1.9; depakote 16; c-diff: neg; urine culture 20,000 e-coli; blood culture: enterococcus faecalis: ampicillin  05-17-16: wbc 16.1; hgb 7.1; hct 25.1; mcv 98.8; plt 382; glucose 92; bun 39; creat<0.3; k+ 3.3; na++ 153; mag 2.0 phos 2.7; hgb a1c 5.1 05-18-16: glucose 88; bun 15; creat <0.3; k+ 3.6; na++ 141 05-19-16: wbc 7.7; hgb 7.1; hct 24.5; mcv 95.0; plt 362; glucose 96; bun 10; creat <0.3; k+ 3.5; na++ 144  05-21-16: wbc 6.5; hgb 8.1; hct 28.0; mcv 92.1; plt 364; glucose 108; bun 8; creat 0.38; k+ 3.2; na++ 142      Review of Systems  Unable to perform ROS: Other    Physical Exam  Constitutional: No distress.  Frail   Eyes: Conjunctivae are normal.  Neck: Neck supple. No JVD present. No thyromegaly present.  Cardiovascular: Normal rate, regular rhythm and intact distal pulses.   Respiratory: Effort normal. No respiratory distress. She has no wheezes.  Diminished breath sounds   GI: Soft. Bowel sounds are normal. She exhibits no distension. There is no tenderness.  Diverting colostomy   Genitourinary:  Genitourinary Comments: foley  Musculoskeletal: She exhibits no edema.  Status post cva Is able to move right upper extremity Unable to move left upper extremity limited passive range of motion Right lower extremity contracted Is status post left aka   Lymphadenopathy:    She has no cervical adenopathy.  Neurological: She is alert.  Skin: Skin is warm and dry. She is not diaphoretic.  picc line in place Stage IV sacral wound: with wound vac in place.   Psychiatric: She has a normal mood and  affect.    ASSESSMENT/ PLAN:   1. 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   2. 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   3. Stage IV sacral ulceration: has wound vac in place; will be seen by wound doctor; will continue current treatment;   4. Failure to thrive: her current weight is 123 pounds; will continue tube feeding for her nutritional support will begin prostat 30 cc three time daily albumin is 1.9  5. Depression with anxiety: will continue wellbutrin 150 mg twice daily is taking depakote  200 mg every 8 hours for mood stabilization;  will continue ativan 0.5 mg every 8 hours as needed for anxiety  6.  Hypertension: will continue clonidine 0.1 mg every 6 hours as needed for systolic b/p >170  7. Septic shock : will complete ampicillin 2 gm IV every 4 hours  through 06-01-16; will monitor   8. Anemia: hgb 8.1 will monitor her status is status post 1 unit transfusion prbc's   9. Dry gangrene: is status post left aka will monitor  Will check cbc; bmp   Time spent with patient  50  minutes >50% time spent counseling; reviewing medical record; tests; labs; and developing future plan of care   MD is aware of resident's narcotic use and is in agreement with current plan of care. We will attempt to wean resident as apropriate   Synthia Innocent NP [facility]  Contact [phone] Monday through Friday 8am- 5pm  After hours call [phone]

## 2016-05-24 ENCOUNTER — Other Ambulatory Visit: Payer: Self-pay | Admitting: *Deleted

## 2016-05-24 LAB — CBC AND DIFFERENTIAL
HEMATOCRIT: 28 % — AB (ref 36–46)
Hemoglobin: 8.4 g/dL — AB (ref 12.0–16.0)
PLATELETS: 475 10*3/uL — AB (ref 150–399)
WBC: 9.1 10*3/mL

## 2016-05-24 LAB — CULTURE, BLOOD (ROUTINE X 2)
CULTURE: NO GROWTH
Culture: NO GROWTH

## 2016-05-24 LAB — BASIC METABOLIC PANEL
BUN: 11 mg/dL (ref 4–21)
CREATININE: 0.3 mg/dL — AB (ref 0.5–1.1)
GLUCOSE: 119 mg/dL
Potassium: 4.8 mmol/L (ref 3.4–5.3)
Sodium: 137 mmol/L (ref 137–147)

## 2016-05-24 MED ORDER — LORAZEPAM 0.5 MG PO TABS: 0.5000 mg | ORAL_TABLET | Freq: Three times a day (TID) | ORAL | 1 refills | Status: DC | PRN

## 2016-05-24 NOTE — Telephone Encounter (Signed)
[facility] #: [phone] Fax#: [phone]  

## 2016-05-28 ENCOUNTER — Encounter: Payer: Self-pay | Admitting: Internal Medicine

## 2016-05-28 ENCOUNTER — Non-Acute Institutional Stay (SKILLED_NURSING_FACILITY): Payer: Medicaid Other | Admitting: Internal Medicine

## 2016-05-28 DIAGNOSIS — Z960 Presence of urogenital implants: Secondary | ICD-10-CM

## 2016-05-28 DIAGNOSIS — R0902 Hypoxemia: Secondary | ICD-10-CM

## 2016-05-28 DIAGNOSIS — Z933 Colostomy status: Secondary | ICD-10-CM | POA: Diagnosis not present

## 2016-05-28 DIAGNOSIS — I69354 Hemiplegia and hemiparesis following cerebral infarction affecting left non-dominant side: Secondary | ICD-10-CM

## 2016-05-28 DIAGNOSIS — D638 Anemia in other chronic diseases classified elsewhere: Secondary | ICD-10-CM | POA: Diagnosis not present

## 2016-05-28 DIAGNOSIS — Z9289 Personal history of other medical treatment: Secondary | ICD-10-CM | POA: Diagnosis not present

## 2016-05-28 DIAGNOSIS — Z931 Gastrostomy status: Secondary | ICD-10-CM

## 2016-05-28 DIAGNOSIS — I10 Essential (primary) hypertension: Secondary | ICD-10-CM | POA: Diagnosis not present

## 2016-05-28 DIAGNOSIS — L8994 Pressure ulcer of unspecified site, stage 4: Secondary | ICD-10-CM | POA: Diagnosis not present

## 2016-05-28 DIAGNOSIS — R627 Adult failure to thrive: Secondary | ICD-10-CM

## 2016-05-28 DIAGNOSIS — F418 Other specified anxiety disorders: Secondary | ICD-10-CM

## 2016-05-28 DIAGNOSIS — Z978 Presence of other specified devices: Secondary | ICD-10-CM

## 2016-05-28 NOTE — Progress Notes (Signed)
Patient ID: Katherine Palmer, female   DOB: Apr 27, 1961, 55 y.o.   MRN: [id number]    HISTORY AND PHYSICAL   DATE: 05/28/2016  Location:    [facility] Room Number: 109 A Place of Service: SNF (31)   Extended Emergency Contact Information Primary Emergency Contact: Heideman,Katawbba Address: [address]          [place], [place] [address] Darden Amber of [place] Phone: [phone] Relation: Daughter Secondary Emergency Contact: Delane Ginger Address: [address]          [place], [place] [address] Darden Amber of [place] Mobile Phone: [phone] Relation: Daughter  Advanced Directive information Does patient have an advance directive?: No, Would patient like information on creating an advanced directive?: No - patient declined information  Chief Complaint  Patient presents with  . Readmit To SNF    HPI:  55 yo female seen today as a readmission into SNF following hospital stay for  Septic shock, stage 4 decubitus ulcer, s/p left AKA, acute encephalopathy, lactiv acidosis, acute respiratory failure, severe protein calorie malnutrition, s/p Peg on TF, hx remote CVA with residual deficit recent diverting colostomy to allow sacral decub to heal. She presented to the ED with fever (Tm 105F) and change in mental status. WBCs 16.1K with abs N 12.1K. SBP 80s improved after IVFs. ID consulted. CXR neg. tx initially with IV vanco. No vegetation by TTE on 9/1st. EF 50-55% with NWMA. Blood cx (+) enterococcus faecalis sens to ampicillin, gent and vanco. abx changed to IV ampicillin. Repeat blood cx no growth. PICC line placed 9/4th and IV abx to continue until 9/16th. Wound care followed sacral wound. No obvious purulence. She was given 1 unit PRBCs due to Hgb 7.1. Potassium repleted. RD recommended goal TF rate  60 cc/hr. Hgb 7.1-->8.1; Cr 0.38; K+ 3.2; WBCs 16.1K-->6.5K at d/c. She presents to SNF for short term rehab with potential for long term care.  She has no  concerns today. TF clamped. No fevers but has had pulse ox drop to mid 80s per nursing. She has a wound vac for sacral decub. No falls. Sleeps well. Appetite poor. She is a poor historian due to expressive aphasia. Hx obtained from chart.  Hx CVA with left hemiparesis - stable on ASA 81 mg daily. She has dysphagia and is s/p Peg tube and receives nutrition via TF. She gets pleasure foods.     Stage IV sacral ulcer - has wound vac in place and is followed by wound doctor  FTT - weight 102 lbs. She gets TF and  prostat 30 cc three time daily. Albumin 1.9  Depression with anxiety - mood stable on wellbutrin 150 mg twice daily; depakote 200 mg every 8 hours for mood stabilization; ativan 0.5 mg every 8 hours as needed for anxiety  Hypertension - BP stable on clonidine 0.1 mg every 6 hours as needed for SBP >170  Hx Anemia - she is s/p 1 unit PRBC prior to d/c. 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 Decubitus ulcer of sacral region, stage 4 (HCC)   . Dysphagia   . Dysphagia as late effect of cerebrovascular disease   . Hyperlipidemia   . Hypertension   . Seizures (HCC)   . Stroke [facility])     Past Surgical History:  Procedure Laterality Date  . 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 COLOSTOMY    .  PEG TUBE PLACEMENT      Patient Care Team: Kirt Boys, DO as PCP - General (Internal Medicine) Sharee Holster, NP as Nurse Practitioner (Geriatric Medicine) [facility] (Skilled Nursing Facility)  Social History   Social History  . Marital status: Divorced    Spouse name: N/A  . Number of children: N/A  . Years of education: N/A   Occupational History  . Not on file.   Social History Main Topics  . Smoking status: Former Games developer  . Smokeless tobacco: Never Used  . Alcohol use Not on file  . Drug use: Unknown  . Sexual activity: No   Other Topics Concern  . Not on file   Social History Narrative  . No  narrative on file     reports that she has quit smoking. She has never used smokeless tobacco. Her alcohol and drug histories are not on file.  Family History  Problem Relation Age of Onset  . Family history unknown: Yes   No family status information on file.    Immunization History  Administered Date(s) Administered  . Influenza-Unspecified 07/26/2015  . PPD Test 04/09/2016, 05/21/2016  . Pneumococcal-Unspecified 04/12/2016    No Known Allergies  Medications: Patient's Medications  New Prescriptions   No medications on file  Previous Medications   ACETAMINOPHEN (TYLENOL) 325 MG TABLET    Place 650 mg into feeding tube every 4 (four) hours as needed for mild pain.    ALBUTEROL (PROVENTIL) (2.5 MG/3ML) 0.083% NEBULIZER SOLUTION    Take 2.5 mg by nebulization every 4 (four) hours as needed for wheezing or shortness of breath.   AMINO ACIDS-PROTEIN HYDROLYS (FEEDING SUPPLEMENT, PRO-STAT SUGAR FREE 64,) LIQD    Place 30 mLs into feeding tube 3 (three) times daily with meals.   AMPICILLIN 2 G IN SODIUM CHLORIDE 0.9 % 50 ML    Inject 2 g into the vein every 4 (four) hours.   ASPIRIN EC 81 MG TABLET    81 mg daily. Via G-Tube   BISACODYL (DULCOLAX) 10 MG SUPPOSITORY    Place 10 mg rectally daily as needed for moderate constipation.   BUPROPION (WELLBUTRIN) 75 MG TABLET    Place 150 mg into feeding tube 2 (two) times daily.   CLONIDINE (CATAPRES) 0.1 MG TABLET    Take 0.1 mg by mouth every 6 (six) hours as needed (blood pressure).    COLLAGENASE (SANTYL) OINTMENT    Apply 1 application topically daily. Left breast   IPRATROPIUM (ATROVENT) 0.02 % NEBULIZER SOLUTION    Take 2.5 mg by nebulization every 4 (four) hours as needed for wheezing or shortness of breath.   LORAZEPAM (ATIVAN) 0.5 MG TABLET    Place 1 tablet (0.5 mg total) into feeding tube every 8 (eight) hours as needed for anxiety.   MAGNESIUM HYDROXIDE (MILK OF MAGNESIA) 400 MG/5ML SUSPENSION    Take 30 mLs by mouth daily as  needed for mild constipation.   MULTIPLE VITAMINS-MINERALS (DECUBI-VITE) CAPS    Take 1 capsule by mouth daily.   NUTRITIONAL SUPPLEMENTS (FEEDING SUPPLEMENT, OSMOLITE 1.5 CAL,) LIQD    Place 1,000 mLs into feeding tube continuous.   OXYCODONE HCL, ABUSE DETER, (OXAYDO) 5 MG TABA    Take 5 mg by mouth every 6 (six) hours as needed (pain).   PANTOPRAZOLE SODIUM (PROTONIX) 40 MG/20 ML PACK    Place 40 mg into feeding tube daily.   POLYETHYLENE GLYCOL (MIRALAX / GLYCOLAX) PACKET    Place 17 g into  feeding tube daily.   PRAVASTATIN (PRAVACHOL) 40 MG TABLET    Place 40 mg into feeding tube daily.   PRENATAL CA CARB-B6-B12-FA (FOLBECAL) 1 MG TABS    Give 1 tablet by tube daily.   SODIUM CHLORIDE FLUSH (NORMAL SALINE FLUSH) 0.9 % SOLN    Inject 10 mLs into the vein every 4 (four) hours.   VALPROATE SODIUM (DEPAKENE PO)    Give 200 mg by tube every 8 (eight) hours.  Modified Medications   No medications on file  Discontinued Medications   No medications on file    Review of Systems  Unable to perform ROS: Other  expressive aphasia  Vitals:   05/28/16 1033  BP: 132/72  Pulse: 90  Resp: 18  Temp: 98 F (36.7 C)  TempSrc: Oral  SpO2: (!) 88%  Weight: 102 lb 9.6 oz (46.5 kg)  Height: 5\' 3"  (1.6 m)   Body mass index is 18.17 kg/m.  Physical Exam  Constitutional: She appears well-developed.  Frail appearing in NAD, lying in bed  HENT:  Mouth/Throat: Oropharynx is clear and moist. No oropharyngeal exudate.  Eyes: Pupils are equal, round, and reactive to light. No scleral icterus.  Neck: Neck supple. Carotid bruit is not present. No tracheal deviation present. No thyromegaly present.  Cardiovascular: Regular rhythm, normal heart sounds and intact distal pulses.  Tachycardia present.  Exam reveals no gallop and no friction rub.   No murmur heard. Left AKA; no RLE edema or calf TTP. Right arm PICC line intact with no redness or d/c at insertion site  Pulmonary/Chest: Effort normal and  breath sounds normal. No stridor. No respiratory distress. She has no wheezes. She has no rales.  Abdominal: Soft. Bowel sounds are normal. She exhibits no distension and no mass. There is no hepatomegaly. There is no tenderness. There is no rebound and no guarding.    Genitourinary:  Genitourinary Comments: Foley DTG clear yellow urine  Musculoskeletal: She exhibits edema.  Contracted (flexion) LUE at elbow and fingers; left AKA looks well healed. No redness or TTP  Lymphadenopathy:    She has no cervical adenopathy.  Neurological: She is alert.  Left hemiparesis and hemiplegia  Skin: Skin is warm and dry. No rash noted.  Sacral decub stage IV per wound care. Wound vac intact  Psychiatric: She has a normal mood and affect. Her behavior is normal.     Labs reviewed: Admission on 05/16/2016, Discharged on 05/21/2016  No results displayed because visit has over 200 results.  CBC Latest Ref Rng & Units 05/21/2016 05/20/2016 05/19/2016  WBC 4.0 - 10.5 K/uL 6.5 7.5 7.7  Hemoglobin 12.0 - 15.0 g/dL 8.1(L) 8.6(L) 7.1(L)  Hematocrit 36.0 - 46.0 % 28.0(L) 29.1(L) 24.5(L)  Platelets 150 - 400 K/uL 364 344 362   CMP Latest Ref Rng & Units 05/21/2016 05/20/2016 05/19/2016  Glucose 65 - 99 mg/dL 161(W) 960(A) 96  BUN 6 - 20 mg/dL 8 11 10   Creatinine 0.44 - 1.00 mg/dL 5.40(J) 8.11(B) <1.47(W)  Sodium 135 - 145 mmol/L 143 145 141  Potassium 3.5 - 5.1 mmol/L 3.2(L) 3.2(L) 3.5  Chloride 101 - 111 mmol/L 114(H) 118(H) 116(H)  CO2 22 - 32 mmol/L 23 21(L) 20(L)  Calcium 8.9 - 10.3 mg/dL 2.9(F) 6.2(Z) 3.0(Q)  Total Protein 6.5 - 8.1 g/dL - - -  Total Bilirubin 0.3 - 1.2 mg/dL - - -  Alkaline Phos 38 - 126 U/L - - -  AST 15 - 41 U/L - - -  ALT  14 - 54 U/L - - -   Lab Results  Component Value Date   HGBA1C 5.1 05/17/2016       Nursing Home on 04/11/2016  Component Date Value Ref Range Status  . INR 04/08/2016 1.3* 0.9 - 1.1 Final  . Hemoglobin 04/08/2016 8.9* 12.0 - 16.0 g/dL Final  . HCT  16/10/960407/24/2017 27* 36 - 46 % Final  . Platelets 04/08/2016 330  150 - 399 K/L Final  . WBC 04/08/2016 9.7  10^3/mL Final  . Glucose 04/08/2016 122  mg/dL Final  . BUN 54/09/811907/24/2017 11  4 - 21 mg/dL Final  . Creatinine 14/78/295607/24/2017 0.2* 0.5 - 1.1 mg/dL Final  . Potassium 21/30/865707/24/2017 4.1  3.4 - 5.3 mmol/L Final  . Sodium 04/08/2016 128* 137 - 147 mmol/L Final  . Alkaline Phosphatase 04/08/2016 65  25 - 125 U/L Final  . ALT 04/08/2016 15  7 - 35 U/L Final  . AST 04/08/2016 14  13 - 35 U/L Final  . Bilirubin, Total 04/08/2016 0.3  mg/dL Final    Ct Head Wo Contrast  Result Date: 05/16/2016 CLINICAL DATA:  Fever. EXAM: CT HEAD WITHOUT CONTRAST TECHNIQUE: Contiguous axial images were obtained from the base of the skull through the vertex without intravenous contrast. COMPARISON:  None. FINDINGS: Brain: Evidence of a prior large right-sided hemispheric infarction with extensive encephalomalacia and ex vacuo dilatation of the right lateral ventricle. Large areas of calcification likely due to calcified hematoma. No CT findings for new/acute process. No definite findings for a left-sided infarct. No extra-axial fluid collections. The brainstem and cerebellum are grossly normal. Vascular: Vascular calcifications without obvious aneurysm. Skull: No skull fracture for bone lesion. Sinuses/Orbits: The paranasal sinuses and mastoid air cells are grossly clear. The globes are intact. Other: No scalp hematoma or other soft tissue abnormality is identified. IMPRESSION: 1. Remote large right hemispheric infarction with extensive encephalomalacia and dystrophic calcifications. 2. No definite acute intracranial process. Electronically Signed   By: Rudie MeyerP.  Gallerani M.D.   On: 05/16/2016 17:27   Dg Chest Port 1 View  Result Date: 05/16/2016 CLINICAL DATA:  LEFT-sided central line placement. EXAM: PORTABLE CHEST 1 VIEW COMPARISON:  Chest radiograph May 16, 2016 at 1303 hours FINDINGS: Interval placement of LEFT subclavian  central venous catheter with distal tip projecting in mid superior vena caval. No pneumothorax. Cardiac silhouette is mildly enlarged, mediastinal silhouette is nonsuspicious, mildly calcified aortic arch. Patchy bibasilar airspace opacities. No pleural effusion or focal consolidation. Old RIGHT rib fractures. Single thoracolumbar Harrington rods. Coarse symmetric calcifications in the neck are likely vascular. IMPRESSION: New LEFT subclavian central venous catheter with distal tip projecting in mid superior vena cava. No pneumothorax. Mild cardiomegaly.  Bibasilar atelectasis, less likely pneumonia. Electronically Signed   By: Awilda Metroourtnay  Bloomer M.D.   On: 05/16/2016 16:10   Dg Chest Portable 1 View  Result Date: 05/16/2016 CLINICAL DATA:  55 year old presenting with acute mental status changes. Personal history of stroke in seizures. Current history of hypertension and sacral decubitus ulcer. EXAM: PORTABLE CHEST 1 VIEW COMPARISON:  None. FINDINGS: Cardiac silhouette upper normal in size for AP portable technique. Lungs clear. Pulmonary vascularity normal. No visible pleural effusions. Old healed right rib fractures. Harrington rod traverses the thoracic and upper lumbar spine. IMPRESSION: No acute cardiopulmonary disease. Electronically Signed   By: Hulan Saashomas  Lawrence M.D.   On: 05/16/2016 13:17     Assessment/Plan   ICD-9-CM ICD-10-CM   1. Stage IV decubitus ulcer (HCC) 707.00 L89.94    707.24  2. Hypoxia 799.02 R09.02   3. Failure to thrive syndrome, adult 783.7 R62.7   4. PEG (percutaneous endoscopic gastrostomy) status (HCC) V44.1 Z93.1   5. Anemia of chronic disease 285.29 D63.8   6. Essential hypertension, benign 401.1 I10   7. Depression with anxiety 300.4 F41.8   8. Colostomy present (HCC) V44.3 Z93.3   9. Hemiparesis affecting left side as late effect of cerebrovascular accident (CVA) (HCC) 438.20 I69.354   10. Foley catheter in place V45.89 Z92.89     Complete IV ampicillin 9/16th  as ordered  Cont other meds as ordered  Check CXR for hypoxia  duoneb q6hrs prn dyspnea, wheezing or cough  [place] O2 at 2L/min prn O2 sat <89%. Check pulse ox q shift  PICC line care as indicated. Pull PICC once abx completed per d/c summary  Peg tube care as indicated  Cont nutritional supplements as ordered  Colostomy care as indicated  Wound care to follow sacral decub  PT/OT/ST as ordered  GOAL: short term rehab with potential for long term care. Communicated with pt and nursing.  Will follow  Azura Tufaro S. Ancil Linsey  [facility] [address] [place], [place] [address] [phone] Cell (Monday-Friday 8 AM - 5 PM) [phone] After 5 PM and follow prompts

## 2016-06-20 ENCOUNTER — Encounter: Payer: Self-pay | Admitting: Adult Health

## 2016-06-20 ENCOUNTER — Non-Acute Institutional Stay (SKILLED_NURSING_FACILITY): Payer: Medicaid Other | Admitting: Adult Health

## 2016-06-20 DIAGNOSIS — I638 Other cerebral infarction: Secondary | ICD-10-CM

## 2016-06-20 DIAGNOSIS — F418 Other specified anxiety disorders: Secondary | ICD-10-CM

## 2016-06-20 DIAGNOSIS — L8944 Pressure ulcer of contiguous site of back, buttock and hip, stage 4: Secondary | ICD-10-CM | POA: Diagnosis not present

## 2016-06-20 DIAGNOSIS — I69391 Dysphagia following cerebral infarction: Secondary | ICD-10-CM

## 2016-06-20 DIAGNOSIS — E43 Unspecified severe protein-calorie malnutrition: Secondary | ICD-10-CM | POA: Diagnosis not present

## 2016-06-20 DIAGNOSIS — D638 Anemia in other chronic diseases classified elsewhere: Secondary | ICD-10-CM

## 2016-06-20 DIAGNOSIS — I10 Essential (primary) hypertension: Secondary | ICD-10-CM

## 2016-06-20 DIAGNOSIS — R627 Adult failure to thrive: Secondary | ICD-10-CM

## 2016-06-20 DIAGNOSIS — I69354 Hemiplegia and hemiparesis following cerebral infarction affecting left non-dominant side: Secondary | ICD-10-CM | POA: Diagnosis not present

## 2016-06-20 DIAGNOSIS — I6389 Other cerebral infarction: Secondary | ICD-10-CM

## 2016-06-20 NOTE — Progress Notes (Signed)
Patient ID: Katherine AhmadiDiane Burright, female   DOB: May 17, 1961, 55 y.o.   MRN: [id number]    [facility]   [facility] Room Number: 112-B Place of Service:  SNF (31)   CODE STATUS: Full Code  No Known Allergies  Chief Complaint  Patient presents with  . Medical Management of Chronic Issues    Follow up    HPI:  She is a long term resident of this facility being seen for the management of her chronic illnesses. Overall there is little change in her status. She is unable to participate in the hpi or ros. There are no nursing concerns at this time.    Past Medical History:  Diagnosis Date  . Decubitus ulcer of sacral region, stage 4 (HCC)   . Dysphagia   . Dysphagia as late effect of cerebrovascular disease   . Hyperlipidemia   . Hypertension   . Seizures (HCC)   . Stroke [facility](HCC)     Past Surgical History:  Procedure Laterality Date  . 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 COLOSTOMY    . PEG TUBE PLACEMENT      Social History   Social History  . Marital status: Divorced    Spouse name: N/A  . Number of children: N/A  . Years of education: N/A   Occupational History  . Not on file.   Social History Main Topics  . Smoking status: Former Games developermoker  . Smokeless tobacco: Never Used  . Alcohol use Not on file  . Drug use: Unknown  . Sexual activity: No   Other Topics Concern  . Not on file   Social History Narrative  . No narrative on file   Family History  Problem Relation Age of Onset  . Family history unknown: Yes      VITAL SIGNS BP 130/69   Pulse 90   Temp 98 F (36.7 C) (Oral)   Resp 18   Ht 5\' 3"  (1.6 m)   Wt 105 lb 7 oz (47.8 kg)   SpO2 94%   BMI 18.68 kg/m   Patient's Medications  New Prescriptions   No medications on file  Previous Medications   ACETAMINOPHEN (TYLENOL) 325 MG TABLET    Place 650 mg into feeding tube every 4 (four) hours as needed for mild pain.    ALBUTEROL (PROVENTIL) (2.5 MG/3ML) 0.083% NEBULIZER SOLUTION     Take 2.5 mg by nebulization every 4 (four) hours as needed for wheezing or shortness of breath.   AMINO ACIDS-PROTEIN HYDROLYS (FEEDING SUPPLEMENT, PRO-STAT SUGAR FREE 64,) LIQD    Place 30 mLs into feeding tube 3 (three) times daily with meals.   ASPIRIN EC 81 MG TABLET    81 mg daily. Via G-Tube   BISACODYL (DULCOLAX) 10 MG SUPPOSITORY    Place 10 mg rectally daily as needed for moderate constipation.   BUPROPION (WELLBUTRIN) 75 MG TABLET    Place 150 mg into feeding tube every morning.    CLONIDINE (CATAPRES) 0.1 MG TABLET    Take 0.1 mg by mouth every 6 (six) hours as needed (blood pressure).    COLLAGENASE (SANTYL) OINTMENT    Apply 1 application topically daily. Left breast   ESCITALOPRAM (LEXAPRO) 5 MG TABLET    Place 5 mg into feeding tube daily.   IPRATROPIUM (ATROVENT) 0.02 % NEBULIZER SOLUTION    Take 2.5 mg by nebulization every 4 (four) hours as needed for wheezing or shortness of breath.   LORAZEPAM (ATIVAN) 0.5 MG  TABLET    Place 1 tablet (0.5 mg total) into feeding tube every 8 (eight) hours as needed for anxiety.   MAGNESIUM HYDROXIDE (MILK OF MAGNESIA) 400 MG/5ML SUSPENSION    Take 30 mLs by mouth daily as needed for mild constipation.   MULTIPLE VITAMINS-MINERALS (DECUBI-VITE) CAPS    Take 1 capsule by mouth daily.   NUTRITIONAL SUPPLEMENTS (FEEDING SUPPLEMENT, OSMOLITE 1.5 CAL,) LIQD    Place 1,000 mLs into feeding tube continuous.   OXYCODONE HCL, ABUSE DETER, (OXAYDO) 5 MG TABA    Take 5 mg by mouth every 6 (six) hours as needed (pain).   PANTOPRAZOLE SODIUM (PROTONIX) 40 MG/20 ML PACK    Place 40 mg into feeding tube daily.   POLYETHYLENE GLYCOL (MIRALAX / GLYCOLAX) PACKET    Place 17 g into feeding tube daily.   PRAVASTATIN (PRAVACHOL) 40 MG TABLET    Place 40 mg into feeding tube daily.   PRENATAL CA CARB-B6-B12-FA (FOLBECAL) 1 MG TABS    Give 1 tablet by tube daily.   VALPROATE SODIUM (DEPAKENE PO)    Give 200 mg by tube every 8 (eight) hours.  Modified Medications   No  medications on file  Discontinued Medications   AMPICILLIN 2 G IN SODIUM CHLORIDE 0.9 % 50 ML    Inject 2 g into the vein every 4 (four) hours.     SIGNIFICANT DIAGNOSTIC EXAMS  03-28-16: chest x-ray: 1. Improvement in lung aeration since prior study with a decrease in pleural effusions. There is central vascular congestion but no convincing pulmonary edema.   04-23-16: 2-d echo: 1. technically difficult study with limited views 2. Normal appearing left ventricular systolic function. EF 55-60% 3. Mild sclerotic aortic valve no significant stenosis by velocity 4. Mild concentric left ventricular hypertrophy 5. Moderate mitral regurgitation 6. Mild tricuspid regurgitation 7. Highly mobile interatrial septum with a suggestion of a septal defect. However, the septum and the valvular structures are not well visualized to determine if there is a defect or evidence of vegetations. 8. Recommend transesophageal echocardiogram for better visualization of te left atrial septum in addition to better visualize the valvular structures given the patient's history   05-16-16: chest x-ray; No acute cardiopulmonary disease.  05-16-16: ct of head: 1. Remote large right hemispheric infarction with extensive encephalomalacia and dystrophic calcifications. 2. No definite acute intracranial process.   05-17-16: TEE: - Left ventricle: Inferior wall hypokinesis The cavity size was normal. Systolic function was normal. The estimated ejection fraction was in the range of 50% to 55%. Wall motion was normal; there were no regional wall motion abnormalities. Left ventricular diastolic function parameters were normal. - Mitral valve: There was mild regurgitation. - Atrial septum: No defect or patent foramen ovale was identified.     LABS REVIEWED:   04-08-16: wbc 9.7; hgb 8.9; hct 26.5; plt 330; glucose 122; bun 11; creat 0.21; k+ 4.1; na++ 128; liver normal albumin 2.2  04-12-16: wbc 15.0; hgb 8.8; hct 26.6; mcv 94.0; plt  386; glucose 125; bun 11; creat 0.21; k+ 4.0; na++ 132; liver normal albumin 2.0; chol 77; ldl 35; trig 81; hdl 26; iron 10; tibc 23; ferritin 471; depakote 15.4; hgb a1c 5.7; abs neut: 8400; abs mono 3750; abs baso 0  04-21-16: picc line tip culture: MRSA: vancomycin 05-10-16: wbc 13.0; hgb 8.1; hct 26.4; mcv 93.6; plt 555 05-16-16: wbc 16.1; hgb 10.7; hct 37.8; mcv 99.7; plt 478; glucose 128; bun 58; creat 0.58; k+ 4.8; na++ 151; ast 46; albumin 1.9;  depakote 16; c-diff: neg; urine culture 20,000 e-coli; blood culture: enterococcus faecalis: ampicillin  05-17-16: wbc 16.1; hgb 7.1; hct 25.1; mcv 98.8; plt 382; glucose 92; bun 39; creat<0.3; k+ 3.3; na++ 153; mag 2.0 phos 2.7; hgb a1c 5.1 05-18-16: glucose 88; bun 15; creat <0.3; k+ 3.6; na++ 141 05-19-16: wbc 7.7; hgb 7.1; hct 24.5; mcv 95.0; plt 362; glucose 96; bun 10; creat <0.3; k+ 3.5; na++ 144  05-21-16: wbc 6.5; hgb 8.1; hct 28.0; mcv 92.1; plt 364; glucose 108; bun 8; creat 0.38; k+ 3.2; na++ 142  05-24-16: wbc 9.1; hgb 8.4; hct 27.6; mcv 91.1; plt 475; glucose 119; bun 11; creat 0.31; k+ 4.8; na++ 139;     Review of Systems  Unable to perform ROS: Other    Physical Exam  Constitutional: No distress.  Frail   Eyes: Conjunctivae are normal.  Neck: Neck supple. No JVD present. No thyromegaly present.  Cardiovascular: Normal rate, regular rhythm and intact distal pulses.   Respiratory: Effort normal. No respiratory distress. She has no wheezes.  Diminished breath sounds   GI: Soft. Bowel sounds are normal. She exhibits no distension. There is no tenderness.  Diverting colostomy   Genitourinary:  Genitourinary Comments: foley  Musculoskeletal: She exhibits no edema.  Status post cva Is able to move right upper extremity Unable to move left upper extremity limited passive range of motion Right lower extremity contracted Is status post left aka   Lymphadenopathy:    She has no cervical adenopathy.  Neurological: She is alert.  Skin: Skin  is warm and dry. She is not diaphoretic.  Sacral stage IV: 15 x 3.5 x 1 cm: wound vac right back: 1.5 x 3 x 0.5 cm santyl and calcium alginate  Psychiatric: She has a normal mood and affect.    ASSESSMENT/ PLAN:   1. 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   2. 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   3. Stage IV sacral ulceration: has wound vac in place; will be seen by wound doctor; will continue current treatment;  Will continue oxycodone 5 mg every 6 hours as needed   4. Failure to thrive: her current weight is 105 pounds; will continue tube feeding for her nutritional support will begin prostat 30 cc three time daily albumin is 1.9. She has been treated for infections over this past month for an infected picc and line and blood cultures; fighting this infection could be a source of her weight loss. Will monitor   5. Depression with anxiety: will continue wellbutrin 150 mg  daily is taking depakote  200 mg every 8 hours for mood stabilization;  will continue ativan 0.5 mg every 8 hours as needed for anxiety  6.  Hypertension: will continue clonidine 0.1 mg every 6 hours as needed for systolic b/p >170  7. Anemia: hgb 8.4 will monitor her status is status post 1 unit transfusion prbc's   8. Constipation: will continue miralax daily   9. Dyslipidemia: ldl 35; will continue pravachol 40 mg daily      MD is aware of resident's narcotic use and is in agreement with current plan of care. We will attempt to wean resident as apropriate   Synthia Innocent NP [facility]  Contact [phone] Monday through Friday 8am- 5pm  After hours call [phone]

## 2016-06-26 ENCOUNTER — Encounter (HOSPITAL_BASED_OUTPATIENT_CLINIC_OR_DEPARTMENT_OTHER): Payer: Medicaid Other | Attending: Surgery

## 2016-06-26 DIAGNOSIS — I6930 Unspecified sequelae of cerebral infarction: Secondary | ICD-10-CM | POA: Insufficient documentation

## 2016-06-26 DIAGNOSIS — L89103 Pressure ulcer of unspecified part of back, stage 3: Secondary | ICD-10-CM | POA: Insufficient documentation

## 2016-06-26 DIAGNOSIS — Z79899 Other long term (current) drug therapy: Secondary | ICD-10-CM | POA: Diagnosis not present

## 2016-06-26 DIAGNOSIS — L89154 Pressure ulcer of sacral region, stage 4: Secondary | ICD-10-CM | POA: Insufficient documentation

## 2016-06-26 DIAGNOSIS — Z89612 Acquired absence of left leg above knee: Secondary | ICD-10-CM | POA: Diagnosis not present

## 2016-07-16 ENCOUNTER — Encounter (HOSPITAL_COMMUNITY): Payer: Self-pay | Admitting: Emergency Medicine

## 2016-07-16 ENCOUNTER — Inpatient Hospital Stay (HOSPITAL_COMMUNITY)
Admission: EM | Admit: 2016-07-16 | Discharge: 2016-07-22 | DRG: 640 | Disposition: A | Discharge status: Skilled Nursing Facility | Payer: Medicaid Other | Attending: Family Medicine | Admitting: Family Medicine

## 2016-07-16 ENCOUNTER — Non-Acute Institutional Stay (SKILLED_NURSING_FACILITY): Payer: Medicaid Other | Admitting: Internal Medicine

## 2016-07-16 ENCOUNTER — Encounter: Payer: Self-pay | Admitting: Internal Medicine

## 2016-07-16 DIAGNOSIS — Z7982 Long term (current) use of aspirin: Secondary | ICD-10-CM

## 2016-07-16 DIAGNOSIS — Z9289 Personal history of other medical treatment: Secondary | ICD-10-CM

## 2016-07-16 DIAGNOSIS — R627 Adult failure to thrive: Secondary | ICD-10-CM

## 2016-07-16 DIAGNOSIS — Z931 Gastrostomy status: Secondary | ICD-10-CM

## 2016-07-16 DIAGNOSIS — I10 Essential (primary) hypertension: Secondary | ICD-10-CM | POA: Diagnosis present

## 2016-07-16 DIAGNOSIS — D649 Anemia, unspecified: Secondary | ICD-10-CM

## 2016-07-16 DIAGNOSIS — I959 Hypotension, unspecified: Secondary | ICD-10-CM

## 2016-07-16 DIAGNOSIS — F329 Major depressive disorder, single episode, unspecified: Secondary | ICD-10-CM | POA: Diagnosis present

## 2016-07-16 DIAGNOSIS — Z79891 Long term (current) use of opiate analgesic: Secondary | ICD-10-CM

## 2016-07-16 DIAGNOSIS — D72829 Elevated white blood cell count, unspecified: Secondary | ICD-10-CM

## 2016-07-16 DIAGNOSIS — E875 Hyperkalemia: Principal | ICD-10-CM | POA: Diagnosis present

## 2016-07-16 DIAGNOSIS — Z933 Colostomy status: Secondary | ICD-10-CM | POA: Diagnosis not present

## 2016-07-16 DIAGNOSIS — E43 Unspecified severe protein-calorie malnutrition: Secondary | ICD-10-CM | POA: Diagnosis not present

## 2016-07-16 DIAGNOSIS — Z978 Presence of other specified devices: Secondary | ICD-10-CM

## 2016-07-16 DIAGNOSIS — D638 Anemia in other chronic diseases classified elsewhere: Secondary | ICD-10-CM | POA: Diagnosis present

## 2016-07-16 DIAGNOSIS — I69354 Hemiplegia and hemiparesis following cerebral infarction affecting left non-dominant side: Secondary | ICD-10-CM

## 2016-07-16 DIAGNOSIS — I472 Ventricular tachycardia: Secondary | ICD-10-CM | POA: Diagnosis present

## 2016-07-16 DIAGNOSIS — R4182 Altered mental status, unspecified: Secondary | ICD-10-CM | POA: Diagnosis present

## 2016-07-16 DIAGNOSIS — L8944 Pressure ulcer of contiguous site of back, buttock and hip, stage 4: Secondary | ICD-10-CM | POA: Diagnosis not present

## 2016-07-16 DIAGNOSIS — L89154 Pressure ulcer of sacral region, stage 4: Secondary | ICD-10-CM | POA: Diagnosis present

## 2016-07-16 DIAGNOSIS — Z960 Presence of urogenital implants: Secondary | ICD-10-CM

## 2016-07-16 DIAGNOSIS — Z681 Body mass index (BMI) 19 or less, adult: Secondary | ICD-10-CM

## 2016-07-16 DIAGNOSIS — N390 Urinary tract infection, site not specified: Secondary | ICD-10-CM

## 2016-07-16 DIAGNOSIS — G40909 Epilepsy, unspecified, not intractable, without status epilepticus: Secondary | ICD-10-CM | POA: Diagnosis present

## 2016-07-16 DIAGNOSIS — Z87891 Personal history of nicotine dependence: Secondary | ICD-10-CM

## 2016-07-16 DIAGNOSIS — R1310 Dysphagia, unspecified: Secondary | ICD-10-CM | POA: Diagnosis present

## 2016-07-16 DIAGNOSIS — Z89612 Acquired absence of left leg above knee: Secondary | ICD-10-CM

## 2016-07-16 DIAGNOSIS — T83511A Infection and inflammatory reaction due to indwelling urethral catheter, initial encounter: Secondary | ICD-10-CM

## 2016-07-16 HISTORY — DX: Depression, unspecified: F32.A

## 2016-07-16 HISTORY — DX: Major depressive disorder, single episode, unspecified: F32.9

## 2016-07-16 LAB — I-STAT CHEM 8, ED
BUN: 85 mg/dL — AB (ref 6–20)
CHLORIDE: 98 mmol/L — AB (ref 101–111)
CREATININE: 0.7 mg/dL (ref 0.44–1.00)
Calcium, Ion: 1.13 mmol/L — ABNORMAL LOW (ref 1.15–1.40)
Glucose, Bld: 87 mg/dL (ref 65–99)
HEMATOCRIT: 26 % — AB (ref 36.0–46.0)
Hemoglobin: 8.8 g/dL — ABNORMAL LOW (ref 12.0–15.0)
POTASSIUM: 7.2 mmol/L — AB (ref 3.5–5.1)
Sodium: 128 mmol/L — ABNORMAL LOW (ref 135–145)
TCO2: 28 mmol/L (ref 0–100)

## 2016-07-16 LAB — RETICULOCYTES
RBC.: 3.86 MIL/uL — ABNORMAL LOW (ref 3.87–5.11)
RETIC COUNT ABSOLUTE: 193 10*3/uL — AB (ref 19.0–186.0)
Retic Ct Pct: 5 % — ABNORMAL HIGH (ref 0.4–3.1)

## 2016-07-16 LAB — BASIC METABOLIC PANEL
BUN: 62 mg/dL — AB (ref 4–21)
Creatinine: 0.4 mg/dL — AB (ref 0.5–1.1)
GLUCOSE: 137 mg/dL
Potassium: 6.9 mmol/L — AB (ref 3.4–5.3)
SODIUM: 133 mmol/L — AB (ref 137–147)

## 2016-07-16 LAB — CBC WITH DIFFERENTIAL/PLATELET
BASOS ABS: 0 10*3/uL (ref 0.0–0.1)
Basophils Relative: 0 %
Eosinophils Absolute: 1.5 10*3/uL — ABNORMAL HIGH (ref 0.0–0.7)
Eosinophils Relative: 13 %
HEMATOCRIT: 21.7 % — AB (ref 36.0–46.0)
HEMOGLOBIN: 6.6 g/dL — AB (ref 12.0–15.0)
LYMPHS PCT: 16 %
Lymphs Abs: 1.9 10*3/uL (ref 0.7–4.0)
MCH: 27.8 pg (ref 26.0–34.0)
MCHC: 30.4 g/dL (ref 30.0–36.0)
MCV: 91.6 fL (ref 78.0–100.0)
MONO ABS: 0.9 10*3/uL (ref 0.1–1.0)
MONOS PCT: 7 %
NEUTROS ABS: 7.5 10*3/uL (ref 1.7–7.7)
NEUTROS PCT: 64 %
Platelets: 567 10*3/uL — ABNORMAL HIGH (ref 150–400)
RBC: 2.37 MIL/uL — ABNORMAL LOW (ref 3.87–5.11)
RDW: 20 % — AB (ref 11.5–15.5)
WBC: 11.8 10*3/uL — ABNORMAL HIGH (ref 4.0–10.5)

## 2016-07-16 LAB — URINALYSIS, ROUTINE W REFLEX MICROSCOPIC
Bilirubin Urine: NEGATIVE
GLUCOSE, UA: NEGATIVE mg/dL
Hgb urine dipstick: NEGATIVE
KETONES UR: NEGATIVE mg/dL
Nitrite: POSITIVE — AB
PH: 7.5 (ref 5.0–8.0)
Protein, ur: NEGATIVE mg/dL
SPECIFIC GRAVITY, URINE: 1.019 (ref 1.005–1.030)

## 2016-07-16 LAB — COMPREHENSIVE METABOLIC PANEL
ALBUMIN: 1.5 g/dL — AB (ref 3.5–5.0)
ALT: 11 U/L — ABNORMAL LOW (ref 14–54)
ANION GAP: 5 (ref 5–15)
AST: 25 U/L (ref 15–41)
Alkaline Phosphatase: 90 U/L (ref 38–126)
BUN: 61 mg/dL — ABNORMAL HIGH (ref 6–20)
CO2: 26 mmol/L (ref 22–32)
Calcium: 11 mg/dL — ABNORMAL HIGH (ref 8.9–10.3)
Chloride: 102 mmol/L (ref 101–111)
Creatinine, Ser: 0.48 mg/dL (ref 0.44–1.00)
GFR calc Af Amer: >60 mL/min (ref >60)
GFR calc non Af Amer: >60 mL/min (ref >60)
GLUCOSE: 156 mg/dL — AB (ref 65–99)
POTASSIUM: 5.2 mmol/L — AB (ref 3.5–5.1)
SODIUM: 133 mmol/L — AB (ref 135–145)
TOTAL PROTEIN: 6 g/dL — AB (ref 6.5–8.1)
Total Bilirubin: 0.7 mg/dL (ref 0.3–1.2)

## 2016-07-16 LAB — CBC AND DIFFERENTIAL
HEMATOCRIT: 26 % — AB (ref 36–46)
HEMOGLOBIN: 7.7 g/dL — AB (ref 12.0–16.0)
Platelets: 601 10*3/uL — AB (ref 150–399)
WBC: 16.5 10*3/mL

## 2016-07-16 LAB — LACTATE DEHYDROGENASE: LDH: 145 U/L (ref 98–192)

## 2016-07-16 LAB — PREPARE RBC (CROSSMATCH)

## 2016-07-16 LAB — URINE MICROSCOPIC-ADD ON

## 2016-07-16 LAB — HEPATIC FUNCTION PANEL
ALT: 11 U/L (ref 7–35)
AST: 12 U/L — AB (ref 13–35)
Alkaline Phosphatase: 127 U/L — AB (ref 25–125)

## 2016-07-16 LAB — I-STAT CG4 LACTIC ACID, ED: Lactic Acid, Venous: 1.84 mmol/L (ref 0.5–1.9)

## 2016-07-16 LAB — ABO/RH: ABO/RH(D): O POS

## 2016-07-16 MED ORDER — VANCOMYCIN HCL IN DEXTROSE 750-5 MG/150ML-% IV SOLN
750.0000 mg | INTRAVENOUS | Status: DC
  Administered 2016-07-17 (×2): 750 mg via INTRAVENOUS
  Filled 2016-07-16 (×2): qty 150

## 2016-07-16 MED ORDER — DEXTROSE 5 % IV SOLN
1.0000 g | INTRAVENOUS | Status: DC
  Administered 2016-07-17 – 2016-07-19 (×4): 1 g via INTRAVENOUS
  Filled 2016-07-16 (×5): qty 10

## 2016-07-16 MED ORDER — SODIUM CHLORIDE 0.9 % IV SOLN
INTRAVENOUS | Status: DC
  Administered 2016-07-16 – 2016-07-22 (×7): via INTRAVENOUS

## 2016-07-16 MED ORDER — ORAL CARE MOUTH RINSE
15.0000 mL | Freq: Two times a day (BID) | OROMUCOSAL | Status: DC
  Administered 2016-07-17 – 2016-07-22 (×12): 15 mL via OROMUCOSAL

## 2016-07-16 MED ORDER — OXYCODONE HCL 5 MG PO TABS
5.0000 mg | ORAL_TABLET | Freq: Four times a day (QID) | ORAL | Status: DC | PRN
  Administered 2016-07-17 – 2016-07-22 (×16): 5 mg via ORAL
  Filled 2016-07-16 (×16): qty 1

## 2016-07-16 MED ORDER — PRO-STAT SUGAR FREE PO LIQD
30.0000 mL | Freq: Three times a day (TID) | ORAL | Status: DC
  Administered 2016-07-17 – 2016-07-19 (×7): 30 mL
  Filled 2016-07-16 (×5): qty 30

## 2016-07-16 MED ORDER — VITAMINS A & D EX OINT
TOPICAL_OINTMENT | CUTANEOUS | Status: AC
  Administered 2016-07-17
  Filled 2016-07-16: qty 5

## 2016-07-16 MED ORDER — ASPIRIN EC 81 MG PO TBEC
81.0000 mg | DELAYED_RELEASE_TABLET | Freq: Every day | ORAL | Status: DC
  Administered 2016-07-17 – 2016-07-22 (×7): 81 mg via ORAL
  Filled 2016-07-16 (×7): qty 1

## 2016-07-16 MED ORDER — SODIUM CHLORIDE 0.9 % IV SOLN
10.0000 mL/h | Freq: Once | INTRAVENOUS | Status: AC
  Administered 2016-07-16: 10 mL/h via INTRAVENOUS

## 2016-07-16 MED ORDER — SODIUM CHLORIDE 0.9% FLUSH
3.0000 mL | Freq: Two times a day (BID) | INTRAVENOUS | Status: DC
  Administered 2016-07-19 – 2016-07-21 (×3): 3 mL via INTRAVENOUS

## 2016-07-16 MED ORDER — PRAVASTATIN SODIUM 40 MG PO TABS
40.0000 mg | ORAL_TABLET | Freq: Every day | ORAL | Status: DC
  Administered 2016-07-17 – 2016-07-21 (×6): 40 mg
  Filled 2016-07-16 (×6): qty 1

## 2016-07-16 MED ORDER — SODIUM POLYSTYRENE SULFONATE 15 GM/60ML PO SUSP
15.0000 g | Freq: Once | ORAL | Status: AC
  Administered 2016-07-17: 15 g via ORAL
  Filled 2016-07-16: qty 60

## 2016-07-16 MED ORDER — MAGNESIUM HYDROXIDE 400 MG/5ML PO SUSP: 30.0000 mL | Freq: Every day | ORAL | Status: DC | PRN

## 2016-07-16 MED ORDER — ADULT MULTIVITAMIN W/MINERALS CH
1.0000 | ORAL_TABLET | Freq: Every day | ORAL | Status: DC
  Administered 2016-07-17 – 2016-07-22 (×6): 1 via ORAL
  Filled 2016-07-16 (×5): qty 1

## 2016-07-16 MED ORDER — PANTOPRAZOLE SODIUM 40 MG PO PACK
40.0000 mg | PACK | Freq: Every day | ORAL | Status: DC
  Administered 2016-07-17 – 2016-07-22 (×6): 40 mg
  Filled 2016-07-16 (×7): qty 20

## 2016-07-16 MED ORDER — SODIUM BICARBONATE 8.4 % IV SOLN
50.0000 meq | Freq: Once | INTRAVENOUS | Status: AC
  Administered 2016-07-16: 50 meq via INTRAVENOUS
  Filled 2016-07-16: qty 50

## 2016-07-16 MED ORDER — VALPROATE SODIUM 250 MG/5ML PO SOLN
200.0000 mg | Freq: Three times a day (TID) | ORAL | Status: DC
  Administered 2016-07-17 – 2016-07-22 (×18): 200 mg via ORAL
  Filled 2016-07-16 (×19): qty 4

## 2016-07-16 MED ORDER — OSMOLITE 1.5 CAL PO LIQD
1000.0000 mL | ORAL | Status: DC
  Administered 2016-07-16 – 2016-07-19 (×3): 1000 mL
  Filled 2016-07-16 (×4): qty 1000

## 2016-07-16 MED ORDER — LORAZEPAM 0.5 MG PO TABS
0.5000 mg | ORAL_TABLET | Freq: Three times a day (TID) | ORAL | Status: DC | PRN
  Administered 2016-07-18 – 2016-07-21 (×2): 0.5 mg
  Filled 2016-07-16 (×2): qty 1

## 2016-07-16 MED ORDER — ENOXAPARIN SODIUM 40 MG/0.4ML ~~LOC~~ SOLN
40.0000 mg | Freq: Every day | SUBCUTANEOUS | Status: DC
  Administered 2016-07-17 – 2016-07-21 (×6): 40 mg via SUBCUTANEOUS
  Filled 2016-07-16 (×6): qty 0.4

## 2016-07-16 MED ORDER — DEXTROSE 50 % IV SOLN
1.0000 | Freq: Once | INTRAVENOUS | Status: AC
  Administered 2016-07-16: 50 mL via INTRAVENOUS
  Filled 2016-07-16: qty 50

## 2016-07-16 MED ORDER — SODIUM CHLORIDE 0.9 % IV BOLUS (SEPSIS)
1000.0000 mL | Freq: Once | INTRAVENOUS | Status: AC
  Administered 2016-07-16: 1000 mL via INTRAVENOUS

## 2016-07-16 MED ORDER — IPRATROPIUM-ALBUTEROL 0.5-2.5 (3) MG/3ML IN SOLN: 3.0000 mL | Freq: Four times a day (QID) | RESPIRATORY_TRACT | Status: DC | PRN

## 2016-07-16 MED ORDER — INSULIN ASPART 100 UNIT/ML ~~LOC~~ SOLN
10.0000 [IU] | Freq: Once | SUBCUTANEOUS | Status: AC
  Administered 2016-07-16: 10 [IU] via INTRAVENOUS
  Filled 2016-07-16: qty 1

## 2016-07-16 MED ORDER — BISACODYL 10 MG RE SUPP: 10.0000 mg | Freq: Every day | RECTAL | Status: DC | PRN

## 2016-07-16 MED ORDER — SODIUM CHLORIDE 0.9 % IV SOLN
1.0000 g | Freq: Once | INTRAVENOUS | Status: AC
  Administered 2016-07-16: 1 g via INTRAVENOUS
  Filled 2016-07-16: qty 10

## 2016-07-16 MED ORDER — ESCITALOPRAM OXALATE 10 MG PO TABS
10.0000 mg | ORAL_TABLET | Freq: Every day | ORAL | Status: DC
  Administered 2016-07-17 – 2016-07-22 (×6): 10 mg
  Filled 2016-07-16 (×6): qty 1

## 2016-07-16 NOTE — ED Notes (Signed)
IV team at bedside 

## 2016-07-16 NOTE — ED Triage Notes (Signed)
Pt brought via EMS to ED for abnormal labs.  EMS reports that recent labs for pt revealed high potassium, low hemoglobin, and high WBCs.  Pt c/o chronic pain in both legs.  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  Right leg is locked in a frog-like position to the side and is apparently the pt's regular positioning.  Pt has dementia and is at baseline neuro status.  Denies any other pain besides her legs.  Vitals 110/70, 90 bpm, 16 RR, 97% RA.

## 2016-07-16 NOTE — ED Notes (Signed)
Attempted to call [facility] to update them about patient.  No answer x1.

## 2016-07-16 NOTE — ED Notes (Signed)
IV team consulted for IV stick.

## 2016-07-16 NOTE — Progress Notes (Signed)
Pharmacy Antibiotic Note  Katherine Palmer is a 55 y.o. female admitted on 07/16/2016 with UTI & cellulitisPharmacy has been consulted for Vancomycin & Rocephin dosing.  Plan: Vancomycin 750mg  IV every 24 hours.  Goal trough 10-15 mcg/mL.  Rocephin 1gm IV q24h Check Vancomycin trough at steady state Monitor renal function and cx data      Temp (24hrs), Avg:98.3 F (36.8 C), Min:98 F (36.7 C), Max:98.6 F (37 C)   Recent Labs Lab 07/16/16 07/16/16 1822 07/16/16 2017  WBC 16.5  --  11.8*  CREATININE 0.4* 0.70 0.48  LATICACIDVEN  --  1.84  --     Estimated Creatinine Clearance: 56.2 mL/min (by C-G formula based on SCr of 0.48 mg/dL).    No Known Allergies  Antimicrobials this admission: Vanc 10/31  >> Rocephin 10/31 >>   Dose adjustments this admission:  Microbiology results: 10/31 BCx: sent  Thank you for allowing pharmacy to be a part of this patient's care.  Junita PushMichelle Laraina Sulton, PharmD, BCPS Pager: [phone] 07/16/2016 9:44 PM

## 2016-07-16 NOTE — ED Notes (Signed)
Pt's urinary catheter bag changed and urine sample collected, bedding and chux changed, pt's side cleaned where colostomy bag leaking.  [facility]  Padding placed between pt's right knee and side of bed.

## 2016-07-16 NOTE — Progress Notes (Signed)
Patient ID: Felix AhmadiDiane Teutsch, female   DOB: [birth year], 55 y.o.   MRN: [id number]    DATE:  07/16/2016  [facility] Room Number: 112 B Place of Service: SNF (31)   Extended Emergency Contact Information Primary Emergency Contact: Dembeck,Katawbba Address: [address]          [place], [place] [address] [place] Phone: [phone] Relation: Daughter Secondary Emergency Contact: Delane GingerWiggins,Keticla Address: [address]          [place], [place] [address] [place] Mobile Phone: [phone] Relation: Daughter  Advanced Directive information Does patient have an advance directive?: No, Would patient like information on creating an advanced directive?: No - patient declined information  Chief Complaint  Patient presents with  . Other    Abnormal Labs    HPI:  55 yo long term resident seen today for abnormal labs. WBCs 16.5K with abs Neut 8%; Hgb 7.7 (down from 9.1); Plts 601K; K+ 6.9; Na+ 133; BUN 61.8; Cr 0.4; Albumin 2.3. Pt has no c/o. She is a poor historian due to lethargy and expressive aphasia. Hx obtained from chart.  Hx CVA with left hemiparesis - stable on ASA 81 mg daily. She has dysphagia and is s/p Peg tube and receives nutrition via TF. She gets pleasure foods.     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 She gets TF and  prostat 30 cc three time daily. Albumin 2.3  Depression with anxiety - mood stable on wellbutrin 150 mg twice daily; depakote 200 mg every 8 hours for mood stabilization; ativan 0.5 mg every 8 hours as needed for anxiety  Hypertension - BP stable on clonidine 0.1 mg every 6 hours as needed for SBP >170  Hx Anemia - she is s/p 1 unit PRBC prior to d/c. 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 Decubitus ulcer of sacral region, stage 4 (HCC)   . Dysphagia   . Dysphagia as late  effect of cerebrovascular disease   . Hyperlipidemia   . Hypertension   . Seizures (HCC)   . Stroke [facility](HCC)     Past Surgical History:  Procedure Laterality Date  . 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 COLOSTOMY    . PEG TUBE PLACEMENT      Patient Care Team: Kirt BoysMonica Kendyl Bissonnette, DO as PCP - General (Internal Medicine) Sharee Holstereborah S Green, NP as Nurse Practitioner (Geriatric Medicine) [facility]  Social History   Social History  . Marital status: Divorced    Spouse name: N/A  . Number of children: N/A  . Years of education: N/A   Occupational History  . Not on file.   Social History Main Topics  . Smoking status: Former Games developermoker  . Smokeless tobacco: Never Used  . Alcohol use Not on file  . Drug use: Unknown  . Sexual activity: No   Other Topics Concern  . Not on file   Social History Narrative  . No narrative on file     reports that she has quit smoking. She has never used smokeless tobacco. Her alcohol and drug histories are not on file.  Family History  Problem Relation Age of Onset  . Family history unknown: Yes - unable to obtain due to expressive  aphasia   No family status information on file.    Immunization History  Administered Date(s) Administered  . Influenza-Unspecified 07/26/2015, 07/12/2016  . PPD Test 04/09/2016, 05/21/2016  . Pneumococcal-Unspecified 04/12/2016    No Known Allergies  Medications: Patient's Medications  New Prescriptions   No medications on file  Previous Medications   ACETAMINOPHEN (TYLENOL) 325 MG TABLET    Place 650 mg into feeding tube every 4 (four) hours as needed for mild pain.    ALBUTEROL (PROVENTIL) (2.5 MG/3ML) 0.083% NEBULIZER SOLUTION    Take 2.5 mg by nebulization every 4 (four) hours as needed for wheezing or shortness of breath.   AMINO ACIDS-PROTEIN HYDROLYS (FEEDING SUPPLEMENT, PRO-STAT SUGAR FREE 64,) LIQD    Place 30 mLs into feeding tube 3 (three) times daily  with meals.   ASCORBIC ACID (VITAMIN C) 500 MG TABLET    Take 500 mg by mouth daily.   ASPIRIN EC 81 MG TABLET    81 mg daily. Via G-Tube   BISACODYL (DULCOLAX) 10 MG SUPPOSITORY    Place 10 mg rectally daily as needed for moderate constipation.   CLONIDINE (CATAPRES) 0.1 MG TABLET    Take 0.1 mg by mouth every 6 (six) hours as needed (blood pressure).    COLLAGENASE (SANTYL) OINTMENT    Apply 1 application topically daily. Left breast   ESCITALOPRAM (LEXAPRO) 10 MG TABLET    Place 10 mg into feeding tube daily.    IPRATROPIUM (ATROVENT) 0.02 % NEBULIZER SOLUTION    Take 2.5 mLs by nebulization every 4 (four) hours as needed for wheezing or shortness of breath.   IPRATROPIUM-ALBUTEROL (DUONEB) 0.5-2.5 (3) MG/3ML SOLN    Take 3 mLs by nebulization every 6 (six) hours as needed.   LORAZEPAM (ATIVAN) 0.5 MG TABLET    Place 1 tablet (0.5 mg total) into feeding tube every 8 (eight) hours as needed for anxiety.   MAGNESIUM HYDROXIDE (MILK OF MAGNESIA) 400 MG/5ML SUSPENSION    Take 30 mLs by mouth daily as needed for mild constipation.   MULTIPLE VITAMINS-MINERALS (DECUBI-VITE) CAPS    Take 1 capsule by mouth daily.   NUTRITIONAL SUPPLEMENTS (FEEDING SUPPLEMENT, OSMOLITE 1.5 CAL,) LIQD    Place 1,000 mLs into feeding tube continuous.   OXYCODONE HCL, ABUSE DETER, (OXAYDO) 5 MG TABA    Take 5 mg by mouth every 6 (six) hours as needed (pain).   PANTOPRAZOLE SODIUM (PROTONIX) 40 MG/20 ML PACK    Place 40 mg into feeding tube daily.   POLYETHYLENE GLYCOL (MIRALAX / GLYCOLAX) PACKET    Place 17 g into feeding tube daily.   PRAVASTATIN (PRAVACHOL) 40 MG TABLET    Place 40 mg into feeding tube daily.   PRENATAL CA CARB-B6-B12-FA (FOLBECAL) 1 MG TABS    Give 1 tablet by tube daily.   VALPROATE SODIUM (DEPAKENE PO)    Give 200 mg by tube every 8 (eight) hours.   ZINC 100 MG TABS    Take 200 mg by mouth daily.  Modified Medications   No medications on file  Discontinued Medications   BUPROPION (WELLBUTRIN) 75  MG TABLET    Place 150 mg into feeding tube every morning.    IPRATROPIUM (ATROVENT) 0.02 % NEBULIZER SOLUTION    Take 2.5 mg by nebulization every 4 (four) hours as needed for wheezing or shortness of breath.    Review of Systems  Unable to perform ROS: Other (lethargy; expressive aphasia)    Vitals:   07/16/16 1605 07/16/16 1621  BP: 99/64 (!) 118/58  Pulse: (!) 103 92  Resp: 18 18  Temp: 98.6 F (37 C) 98 F (36.7 C)  TempSrc: Oral Oral  SpO2: 97% 97%  Weight: 99 lb 12.8 oz (45.3 kg)    Body mass index is 17.68 kg/m.  Physical Exam  Constitutional: She appears well-developed.  Frail appearing in NAD; lying in bed  HENT:  Mouth/Throat: Oropharynx is clear and moist. No oropharyngeal exudate.  MMM  Eyes: Pupils are equal, round, and reactive to light. No scleral icterus.  Neck: Neck supple. Carotid bruit is not present. No tracheal deviation present. No thyromegaly present.  Cardiovascular: Regular rhythm and intact distal pulses.   Occasional extrasystoles are present. Tachycardia present.  Exam reveals no gallop and no friction rub.   Murmur (1/6 SEM) heard. Left AKA; no RLE edema or calf TTP. Right arm PICC line intact with no redness or d/c at insertion site  Pulmonary/Chest: Effort normal and breath sounds normal. No stridor. No respiratory distress. She has no wheezes. She has no rales.  CTA anteriorly  Abdominal: Soft. Bowel sounds are normal. She exhibits no distension and no mass. There is no hepatomegaly. There is tenderness. There is no rebound and no guarding.    Genitourinary:  Genitourinary Comments: Foley DTG dark yellow urine  Musculoskeletal: She exhibits edema and deformity (LE contractures).  Contracted (flexion) LUE at elbow and fingers; left AKA looks well healed. No redness or TTP  Lymphadenopathy:    She has no cervical adenopathy.  Neurological: She is alert.  Left hemiparesis and hemiplegia; expressive aphasia  Skin: Skin is warm and dry. No  rash noted.  Sacral decub stage IV per wound care. Wound vac intact  Psychiatric: She has a normal mood and affect. Her behavior is normal.     Labs reviewed: Nursing Home on 07/16/2016  Component Date Value Ref Range Status  . Hemoglobin 07/16/2016 7.7* 12.0 - 16.0 g/dL Final  . HCT 62/13/0865 26* 36 - 46 % Final  . Platelets 07/16/2016 601* 150 - 399 K/L Final  . WBC 07/16/2016 16.5  10^3/mL Final  . Glucose 07/16/2016 137  mg/dL Final  . BUN 78/46/9629 62* 4 - 21 mg/dL Final  . Creatinine 52/84/1324 0.4* 0.5 - 1.1 mg/dL Final  . Potassium 40/06/2724 6.9* 3.4 - 5.3 mmol/L Final  . Sodium 07/16/2016 133* 137 - 147 mmol/L Final  . Alkaline Phosphatase 07/16/2016 127* 25 - 125 U/L Final  . ALT 07/16/2016 11  7 - 35 U/L Final  . AST 07/16/2016 12* 13 - 35 U/L Final  Nursing Home on 06/20/2016  Component Date Value Ref Range Status  . Hemoglobin 05/24/2016 8.4* 12.0 - 16.0 g/dL Final  . HCT 36/64/4034 28* 36 - 46 % Final  . Platelets 05/24/2016 475* 150 - 399 K/L Final  . WBC 05/24/2016 9.1  10^3/mL Final  . Glucose 05/24/2016 119  mg/dL Final  . BUN 74/25/9563 11  4 - 21 mg/dL Final  . Creatinine 87/56/4332 0.3* 0.5 - 1.1 mg/dL Final  . Potassium 95/18/8416 4.8  3.4 - 5.3 mmol/L Final  . Sodium 05/24/2016 137  137 - 147 mmol/L Final  Admission on 05/16/2016, Discharged on 05/21/2016  No results displayed because visit has over 200 results.      No results found.   Assessment/Plan   ICD-9-CM ICD-10-CM   1. Hyperkalemia 276.7 E87.5   2. Leukocytosis, unspecified type 288.60 D72.829   3. Pressure ulcer of contiguous region involving back, buttock, and  hip, stage 4, unspecified laterality (HCC) 707.09 L89.44    707.24    4. Protein-calorie malnutrition, severe 262 E43   5. PEG (percutaneous endoscopic gastrostomy) status (HCC) V44.1 Z93.1   6. Colostomy present (HCC) V44.3 Z93.3   7. Foley catheter in place V45.89 Z92.89   8. Hemiparesis affecting left side as late  effect of cerebrovascular accident (CVA) (HCC) 438.20 I69.354   9. Failure to thrive syndrome, adult 783.7 R62.7   10. Hypotension, unspecified hypotension type 458.9 I95.9    possibly related to dehydration     Due to acuity of situation, pt sent to ED for further eval and mx  Will follow once readmitted   [facility] HospitalMonica S. Ancil Linseyarter, D. O., F. A. C. O. I.  [facility] [address] [place], [place] [address] [phone] Cell (Monday-Friday 8 AM - 5 PM) [phone] After 5 PM and follow prompts

## 2016-07-16 NOTE — Progress Notes (Signed)
Dear Doctor: Clydene PughKnott This patient has been identified as a candidate for PICC for the following reason (s): IV therapy over 48 hours and poor veins/poor circulatory system (CHF, COPD, emphysema, diabetes, steroid use, IV drug abuse, etc.) If you agree, please write an order for the indicated device. For any questions contact the Vascular Access Team at [phone] if no answer, please leave a message.  Thank you for supporting the early vascular access assessment program.

## 2016-07-16 NOTE — ED Provider Notes (Signed)
[facility]-EMERGENCY DEPT Provider Note   CSN: [id number] Arrival date & time: 07/16/16  1655     History   Chief Complaint Chief Complaint  Patient presents with  . Abnormal Lab    HPI Katherine Palmer is a 55 y.o. female.  Patient presents after lab draw at facility showed anemia, hyperkalemia and concern for elevated WBC. Chronically ill with indwelling foley but otherwise at baseline with no acute complaints.   The history is provided by the patient.  Illness  This is a new problem. The current episode started 12 to 24 hours ago. The problem occurs constantly. The problem has not changed since onset.Pertinent negatives include no chest pain, no abdominal pain and no shortness of breath. Nothing aggravates the symptoms. Nothing relieves the symptoms. She has tried nothing for the symptoms.    Past Medical History:  Diagnosis Date  . Decubitus ulcer of sacral region, stage 4 (HCC)   . Dysphagia   . Dysphagia as late effect of cerebrovascular disease   . Hyperlipidemia   . Hypertension   . Seizures (HCC)   . Stroke [facility](HCC)     Patient Active Problem List   Diagnosis Date Noted  . Protein-calorie malnutrition, severe 05/21/2016  . Stage IV decubitus ulcer (HCC) 05/18/2016  . Cerebral infarction (HCC)   . Hx of AKA (above knee amputation) (HCC)   . Hemiparesis affecting left side as late effect of cerebrovascular accident (CVA) (HCC) 04/11/2016  . Essential hypertension, benign 04/11/2016  . Failure to thrive syndrome, adult 04/11/2016  . Dysphagia due to old cerebrovascular accident 04/11/2016  . Depression with anxiety 04/11/2016  . Colostomy present (HCC) 04/11/2016  . S/P AKA (above knee amputation) (HCC) 04/11/2016  . Anemia of chronic disease 04/11/2016    Past Surgical History:  Procedure Laterality Date  . 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 COLOSTOMY    . PEG TUBE PLACEMENT      OB History    No data available       Home Medications    Prior to  Admission medications   Medication Sig Start Date End Date Taking? Authorizing Provider  acetaminophen (TYLENOL) 325 MG tablet Place 650 mg into feeding tube every 4 (four) hours as needed for mild pain.    Yes Historical Provider, MD  albuterol (PROVENTIL) (2.5 MG/3ML) 0.083% nebulizer solution Take 2.5 mg by nebulization every 4 (four) hours as needed for wheezing or shortness of breath.   Yes Historical Provider, MD  Amino Acids-Protein Hydrolys (FEEDING SUPPLEMENT, PRO-STAT SUGAR FREE 64,) LIQD Place 30 mLs into feeding tube 3 (three) times daily with meals.   Yes Historical Provider, MD  ascorbic acid (VITAMIN C) 500 MG tablet Take 500 mg by mouth daily.   Yes Historical Provider, MD  aspirin EC 81 MG tablet Take 81 mg by mouth daily. Via G-Tube    Yes Historical Provider, MD  bisacodyl (DULCOLAX) 10 MG suppository Place 10 mg rectally daily as needed for moderate constipation.   Yes Historical Provider, MD  cloNIDine (CATAPRES) 0.1 MG tablet Take 0.1 mg by mouth every 6 (six) hours as needed (blood pressure).    Yes Historical Provider, MD  escitalopram (LEXAPRO) 5 MG tablet Place 10 mg into feeding tube daily.   Yes Historical Provider, MD  ipratropium (ATROVENT) 0.02 % nebulizer solution Take 2.5 mLs by nebulization every 4 (four) hours as needed for wheezing or shortness of breath.   Yes Historical Provider, MD  ipratropium-albuterol (DUONEB) 0.5-2.5 (3) MG/3ML SOLN  Take 3 mLs by nebulization every 6 (six) hours as needed (sob).    Yes Historical Provider, MD  LORazepam (ATIVAN) 0.5 MG tablet Place 1 tablet (0.5 mg total) into feeding tube every 8 (eight) hours as needed for anxiety. 05/24/16  Yes Kirt BoysMonica Carter, DO  magnesium hydroxide (MILK OF MAGNESIA) 400 MG/5ML suspension Take 30 mLs by mouth daily as needed for mild constipation.   Yes Historical Provider, MD  Multiple Vitamins-Minerals (DECUBI-VITE) CAPS Take 1 capsule by mouth daily.   Yes Historical Provider, MD  Nutritional Supplements  (FEEDING SUPPLEMENT, OSMOLITE 1.5 CAL,) LIQD Place 1,000 mLs into feeding tube continuous. 05/21/16  Yes Jeralyn BennettEzequiel Zamora, MD  OxyCODONE HCl, Abuse Deter, (OXAYDO) 5 MG TABA Take 5 mg by mouth every 6 (six) hours as needed (pain). 05/23/16  Yes Tiffany L Reed, DO  pantoprazole sodium (PROTONIX) 40 mg/20 mL PACK Place 40 mg into feeding tube daily.   Yes Historical Provider, MD  polyethylene glycol (MIRALAX / GLYCOLAX) packet Place 17 g into feeding tube daily.   Yes Historical Provider, MD  pravastatin (PRAVACHOL) 40 MG tablet Place 40 mg into feeding tube daily.   Yes Historical Provider, MD  Prenatal Ca Carb-B6-B12-FA (FOLBECAL) 1 MG TABS Give 1 tablet by tube daily.   Yes Historical Provider, MD  Valproate Sodium (DEPAKENE PO) Give 200 mg by tube every 8 (eight) hours.   Yes Historical Provider, MD  Zinc 100 MG TABS Take 200 mg by mouth daily.   Yes Historical Provider, MD  collagenase (SANTYL) ointment Apply 1 application topically daily. Left breast    Historical Provider, MD  escitalopram (LEXAPRO) 10 MG tablet Place 10 mg into feeding tube daily.     Historical Provider, MD    Family History Family History  Problem Relation Age of Onset  . Family history unknown: Yes    Social History Social History  Substance Use Topics  . Smoking status: Former Games developermoker  . Smokeless tobacco: Never Used  . Alcohol use No     Allergies   Review of patient's allergies indicates no known allergies.   Review of Systems Review of Systems  Respiratory: Negative for shortness of breath.   Cardiovascular: Negative for chest pain.  Gastrointestinal: Negative for abdominal pain.  All other systems reviewed and are negative.    Physical Exam Updated Vital Signs BP 120/65   Pulse 98   Temp 98.2 F (36.8 C) (Oral)   Resp 20   SpO2 100%   Physical Exam  Constitutional: She is oriented to person, place, and time. No distress.  Chronically ill appearing with diffuse contractures  HENT:  Head:  Normocephalic.  Nose: Nose normal.  Eyes: Conjunctivae are normal.  Neck: Neck supple. No tracheal deviation present.  Cardiovascular: Regular rhythm and normal heart sounds.  Tachycardia present.   Pulmonary/Chest: Effort normal and breath sounds normal. No respiratory distress.  Abdominal: Soft. She exhibits no distension. There is no tenderness.  Neurological: She is alert and oriented to person, place, and time.  Skin: Skin is warm and dry.  Psychiatric: She has a normal mood and affect.  Vitals reviewed.    ED Treatments / Results  Labs (all labs ordered are listed, but only abnormal results are displayed) Labs Reviewed  COMPREHENSIVE METABOLIC PANEL - Abnormal; Notable for the following:       Result Value   Sodium 133 (*)    Potassium 5.2 (*)    Glucose, Bld 156 (*)    BUN 61 (*)  Calcium 11.0 (*)    Total Protein 6.0 (*)    Albumin 1.5 (*)    ALT 11 (*)    All other components within normal limits  CBC WITH DIFFERENTIAL/PLATELET - Abnormal; Notable for the following:    WBC 11.8 (*)    RBC 2.37 (*)    Hemoglobin 6.6 (*)    HCT 21.7 (*)    RDW 20.0 (*)    Platelets 567 (*)    Eosinophils Absolute 1.5 (*)    All other components within normal limits  URINALYSIS, ROUTINE W REFLEX MICROSCOPIC (NOT AT [facility]) - Abnormal; Notable for the following:    APPearance CLOUDY (*)    Nitrite POSITIVE (*)    Leukocytes, UA MODERATE (*)    All other components within normal limits  URINE MICROSCOPIC-ADD ON - Abnormal; Notable for the following:    Squamous Epithelial / LPF 6-30 (*)    Bacteria, UA FEW (*)    All other components within normal limits  I-STAT CHEM 8, ED - Abnormal; Notable for the following:    Sodium 128 (*)    Potassium 7.2 (*)    Chloride 98 (*)    BUN 85 (*)    Calcium, Ion 1.13 (*)    Hemoglobin 8.8 (*)    HCT 26.0 (*)    All other components within normal limits  I-STAT CG4 LACTIC ACID, ED  I-STAT CG4 LACTIC ACID, ED  TYPE AND SCREEN  PREPARE  RBC (CROSSMATCH)    EKG  EKG Interpretation  Date/Time:  Tuesday July 16 2016 17:16:01 EDT Ventricular Rate:  89 PR Interval:    QRS Duration: 69 QT Interval:  356 QTC Calculation: 434 R Axis:   14 Text Interpretation:  Sinus rhythm Probable left atrial enlargement Borderline low voltage, extremity leads No significant change since last tracing Confirmed by Shean Gerding MD, Annibelle Brazie ([id number]) on 07/16/2016 5:48:39 PM       Radiology No results found.  Procedures Procedures (including critical care time)  CRITICAL CARE Performed by: Lyndal Pulley Total critical care time: 30 minutes Critical care time was exclusive of separately billable procedures and treating other patients. Critical care was necessary to treat or prevent imminent or life-threatening deterioration. Critical care was time spent personally by me on the following activities: development of treatment plan with patient and/or surrogate as well as nursing, discussions with consultants, evaluation of patient's response to treatment, examination of patient, obtaining history from patient or surrogate, ordering and performing treatments and interventions, ordering and review of laboratory studies, ordering and review of radiographic studies, pulse oximetry and re-evaluation of patient's condition.  Medications Ordered in ED Medications  0.9 %  sodium chloride infusion (not administered)  calcium gluconate 1 g in sodium chloride 0.9 % 100 mL IVPB (1 g Intravenous New Bag/Given 07/16/16 2011)  insulin aspart (novoLOG) injection 10 Units (10 Units Intravenous Given 07/16/16 1953)  dextrose 50 % solution 50 mL (50 mLs Intravenous Given 07/16/16 1953)  sodium bicarbonate injection 50 mEq (50 mEq Intravenous Given 07/16/16 1958)  sodium chloride 0.9 % bolus 1,000 mL (1,000 mLs Intravenous New Bag/Given 07/16/16 1953)     Initial Impression / Assessment and Plan / ED Course  I have reviewed the triage vital signs and the nursing  notes.  Pertinent labs & imaging results that were available during my care of the patient were reviewed by me and considered in my medical decision making (see chart for details).  Clinical Course  Value Comment By Time  Chloride: (!) 19 (Reviewed) Lyndal Pulley, MD 10/31 1936    55 y.o. female presents with lab abnormalities noted at facility. K of 7.2 without EKG changes on istat labs temporized with insulin/d50, calcium gluconate, and bicarb with repeat of 5.2. There is evidence of recurrent anemia but no reported bleeding episodes, 1u PRBC ordered to initiate resuscitation. Will require admission for further stabilization and treatment. Hospitalist was consulted for admission and will see the patient in the emergency department.   Final Clinical Impressions(s) / ED Diagnoses   Final diagnoses:  Acute hyperkalemia  Anemia, unspecified type    New Prescriptions New Prescriptions   No medications on file     Lyndal Pulley, MD 07/17/16 [phone]

## 2016-07-16 NOTE — ED Notes (Signed)
This RN attempted IV x1 

## 2016-07-16 NOTE — H&P (Signed)
History and Physical  Katherine AhmadiDiane Grave NFA:[id number] DOB: June 29, 1961 DOA: 07/16/2016  PCP:  Kirt Boysarter, Monica, DO   Chief Complaint:  Abnormal labs  History of Present Illness:  Patient is a 55 yo female with history of CVA with left hemiparesis, with left AKA, stage IV decub ulcer with colostomy and PEG tube for feeding due to dysphagia, seizure disorder and HTN. She was brought to the ED for "abnormal Labs" Patient denied any complaints and she does not know why labs were drawn but she has been feeling "down" for the past few days. She was found to be hyperkalemic. She had IVF, Ca gluconate and regular insulin in the ER and hospitalist service was consulted to admit for hyperkalemia.   Review of Systems:  CONSTITUTIONAL:     No night sweats.  No fatigue.  No fever. No chills. Eyes:                            No visual changes.  No eye pain.  No eye discharge.   ENT:                              No epistaxis.  No sinus pain.  No sore throat.   No congestion. RESPIRATORY:           No cough.  No wheeze.  No hemoptysis.  No dyspnea CARDIOVASCULAR   :  No chest pains.  No palpitations. GASTROINTESTINAL:  No abdominal pain.  No nausea. No vomiting.  No diarrhea. No  constipation.  No hematemesis.  No hematochezia.  No melena. GENITOURINARY:      No urgency.  No frequency.  No dysuria.  No hematuria.  No  obstructive symptoms.  No discharge.  No pain.   MUSCULOSKELETAL:  ++ musculoskeletal pain in left leg and right leg.  No joint swelling.  No arthritis. NEUROLOGICAL:        No confusion.  No weakness. No headache. No seizure. PSYCHIATRIC:             +depression. No anxiety. No suicidal ideation. SKIN:                             No rashes.  No lesions.  No wounds. ENDOCRINE:                No weight loss.  No polydipsia.  No polyuria.  No polyphagia. HEMATOLOGIC:           No purpura.  No petechiae.  No bleeding.  ALLERGIC                 : No pruritus.  No angioedema Other:  Past  Medical and Surgical History:   Past Medical History:  Diagnosis Date  . Decubitus ulcer of sacral region, stage 4 (HCC)   . Depression   . Dysphagia   . Dysphagia as late effect of cerebrovascular disease   . Hyperlipidemia   . Hypertension   . Seizures (HCC)   . Stroke [facility](HCC)    Past Surgical History:  Procedure Laterality Date  . 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 COLOSTOMY    . PEG TUBE PLACEMENT      Social History:   reports that she has quit smoking. She has never used smokeless tobacco. She reports that she does not drink alcohol or  use drugs.    No Known Allergies  Family History  Problem Relation Age of Onset  . Family history unknown: Yes      Prior to Admission medications   Medication Sig Start Date End Date Taking? Authorizing Provider  acetaminophen (TYLENOL) 325 MG tablet Place 650 mg into feeding tube every 4 (four) hours as needed for mild pain.    Yes Historical Provider, MD  albuterol (PROVENTIL) (2.5 MG/3ML) 0.083% nebulizer solution Take 2.5 mg by nebulization every 4 (four) hours as needed for wheezing or shortness of breath.   Yes Historical Provider, MD  Amino Acids-Protein Hydrolys (FEEDING SUPPLEMENT, PRO-STAT SUGAR FREE 64,) LIQD Place 30 mLs into feeding tube 3 (three) times daily with meals.   Yes Historical Provider, MD  ascorbic acid (VITAMIN C) 500 MG tablet Take 500 mg by mouth daily.   Yes Historical Provider, MD  aspirin EC 81 MG tablet Take 81 mg by mouth daily. Via G-Tube    Yes Historical Provider, MD  bisacodyl (DULCOLAX) 10 MG suppository Place 10 mg rectally daily as needed for moderate constipation.   Yes Historical Provider, MD  cloNIDine (CATAPRES) 0.1 MG tablet Take 0.1 mg by mouth every 6 (six) hours as needed (blood pressure).    Yes Historical Provider, MD  escitalopram (LEXAPRO) 5 MG tablet Place 10 mg into feeding tube daily.   Yes Historical Provider, MD  ipratropium (ATROVENT) 0.02 % nebulizer solution Take 2.5 mLs  by nebulization every 4 (four) hours as needed for wheezing or shortness of breath.   Yes Historical Provider, MD  ipratropium-albuterol (DUONEB) 0.5-2.5 (3) MG/3ML SOLN Take 3 mLs by nebulization every 6 (six) hours as needed (sob).    Yes Historical Provider, MD  LORazepam (ATIVAN) 0.5 MG tablet Place 1 tablet (0.5 mg total) into feeding tube every 8 (eight) hours as needed for anxiety. 05/24/16  Yes Kirt Boys, DO  magnesium hydroxide (MILK OF MAGNESIA) 400 MG/5ML suspension Take 30 mLs by mouth daily as needed for mild constipation.   Yes Historical Provider, MD  Multiple Vitamins-Minerals (DECUBI-VITE) CAPS Take 1 capsule by mouth daily.   Yes Historical Provider, MD  Nutritional Supplements (FEEDING SUPPLEMENT, OSMOLITE 1.5 CAL,) LIQD Place 1,000 mLs into feeding tube continuous. 05/21/16  Yes Jeralyn Bennett, MD  OxyCODONE HCl, Abuse Deter, (OXAYDO) 5 MG TABA Take 5 mg by mouth every 6 (six) hours as needed (pain). 05/23/16  Yes Tiffany L Reed, DO  pantoprazole sodium (PROTONIX) 40 mg/20 mL PACK Place 40 mg into feeding tube daily.   Yes Historical Provider, MD  polyethylene glycol (MIRALAX / GLYCOLAX) packet Place 17 g into feeding tube daily.   Yes Historical Provider, MD  pravastatin (PRAVACHOL) 40 MG tablet Place 40 mg into feeding tube daily.   Yes Historical Provider, MD  Prenatal Ca Carb-B6-B12-FA (FOLBECAL) 1 MG TABS Give 1 tablet by tube daily.   Yes Historical Provider, MD  Valproate Sodium (DEPAKENE PO) Give 200 mg by tube every 8 (eight) hours.   Yes Historical Provider, MD  Zinc 100 MG TABS Take 200 mg by mouth daily.   Yes Historical Provider, MD  collagenase (SANTYL) ointment Apply 1 application topically daily. Left breast    Historical Provider, MD  escitalopram (LEXAPRO) 10 MG tablet Place 10 mg into feeding tube daily.     Historical Provider, MD    Physical Exam: BP 117/66   Pulse 102   Temp 98.2 F (36.8 C) (Oral)   Resp 19   SpO2 97%  GENERAL :   Alert and  cooperative, and appears to be in no acute distress. HEAD:           normocephalic. EYES:            PERRL, EOMI.  EARS:           hearing grossly intact. NOSE:           No nasal discharge. NECK:          supple CARDIAC:    Normal S1 and S2. No gallop. No murmurs.  Vascular:     no peripheral edema.  LUNGS:       Clear to auscultation  ABDOMEN: Positive bowel sounds. Soft, mildly distended and mildly tender to palpation diffusely. Colostomy bag is in place.     MSK:           No joint erythema or tenderness.  EXT           :left AKA. Stump looks clean. Right leg contracture with abrasions and muscle hypotrophy.  Neuro        : Alert, oriented to person, place SKIN:            Stage IV decub ulcer with vacum device , appears infected.           Labs on Admission:  Reviewed.   Radiological Exams on Admission: No results found.  EKG:  Independently reviewed. No T changes. Sinus  Assessment/Plan  Hyperkalemia: Unclear etiology  Resolving Will give 15gm of kayexalate and repeat K in am No EKG changes  Leukocytosis:  Likely due to infection in decub ulcer and/or UTI Started on vancomycin and Ceft May need debridement in am for decub ulcer  Anemia: No bleeding  Likely due to chronic disease Transfusing with 1 PRBC unit in the ED Repeat CBC in am Monitor for bleeding  UTI: started on ceft Ucx sent  HTN: hold meds  Seizure: cont Depakote   Input & Output: ordered  Lines & Tubes: PIV, Foley, Colostomy bag DVT prophylaxis: [place] enoxaparin  GI prophylaxis: PPI Consultants: may need gen surgery consult in am / palliative consult  Code Status: full ( patient may benefit from palliative consult in am) Family Communication: none at bedside  Disposition Plan: Tele bed     Eston EstersAhmad Jabria Loos M.D [facility]

## 2016-07-17 DIAGNOSIS — Z681 Body mass index (BMI) 19 or less, adult: Secondary | ICD-10-CM | POA: Diagnosis not present

## 2016-07-17 DIAGNOSIS — N390 Urinary tract infection, site not specified: Secondary | ICD-10-CM | POA: Diagnosis present

## 2016-07-17 DIAGNOSIS — Z933 Colostomy status: Secondary | ICD-10-CM | POA: Diagnosis not present

## 2016-07-17 DIAGNOSIS — E875 Hyperkalemia: Principal | ICD-10-CM

## 2016-07-17 DIAGNOSIS — L8944 Pressure ulcer of contiguous site of back, buttock and hip, stage 4: Secondary | ICD-10-CM | POA: Diagnosis not present

## 2016-07-17 DIAGNOSIS — Z79891 Long term (current) use of opiate analgesic: Secondary | ICD-10-CM | POA: Diagnosis not present

## 2016-07-17 DIAGNOSIS — D638 Anemia in other chronic diseases classified elsewhere: Secondary | ICD-10-CM | POA: Diagnosis present

## 2016-07-17 DIAGNOSIS — G40909 Epilepsy, unspecified, not intractable, without status epilepticus: Secondary | ICD-10-CM | POA: Diagnosis present

## 2016-07-17 DIAGNOSIS — Z931 Gastrostomy status: Secondary | ICD-10-CM | POA: Diagnosis not present

## 2016-07-17 DIAGNOSIS — R1310 Dysphagia, unspecified: Secondary | ICD-10-CM | POA: Diagnosis present

## 2016-07-17 DIAGNOSIS — F329 Major depressive disorder, single episode, unspecified: Secondary | ICD-10-CM | POA: Diagnosis present

## 2016-07-17 DIAGNOSIS — I10 Essential (primary) hypertension: Secondary | ICD-10-CM | POA: Diagnosis present

## 2016-07-17 DIAGNOSIS — Z87891 Personal history of nicotine dependence: Secondary | ICD-10-CM | POA: Diagnosis not present

## 2016-07-17 DIAGNOSIS — I69354 Hemiplegia and hemiparesis following cerebral infarction affecting left non-dominant side: Secondary | ICD-10-CM | POA: Diagnosis not present

## 2016-07-17 DIAGNOSIS — I472 Ventricular tachycardia: Secondary | ICD-10-CM | POA: Diagnosis present

## 2016-07-17 DIAGNOSIS — L89154 Pressure ulcer of sacral region, stage 4: Secondary | ICD-10-CM | POA: Diagnosis present

## 2016-07-17 DIAGNOSIS — R4182 Altered mental status, unspecified: Secondary | ICD-10-CM | POA: Diagnosis present

## 2016-07-17 DIAGNOSIS — Z89612 Acquired absence of left leg above knee: Secondary | ICD-10-CM | POA: Diagnosis not present

## 2016-07-17 DIAGNOSIS — Z7982 Long term (current) use of aspirin: Secondary | ICD-10-CM | POA: Diagnosis not present

## 2016-07-17 LAB — COMPREHENSIVE METABOLIC PANEL
ALK PHOS: 95 U/L (ref 38–126)
ALT: 13 U/L — AB (ref 14–54)
AST: 20 U/L (ref 15–41)
Albumin: 1.6 g/dL — ABNORMAL LOW (ref 3.5–5.0)
Anion gap: 10 (ref 5–15)
BUN: 46 mg/dL — AB (ref 6–20)
CALCIUM: 8.4 mg/dL — AB (ref 8.9–10.3)
CHLORIDE: 102 mmol/L (ref 101–111)
CO2: 23 mmol/L (ref 22–32)
CREATININE: 0.4 mg/dL — AB (ref 0.44–1.00)
Glucose, Bld: 137 mg/dL — ABNORMAL HIGH (ref 65–99)
Potassium: 5.1 mmol/L (ref 3.5–5.1)
SODIUM: 135 mmol/L (ref 135–145)
Total Bilirubin: 0.7 mg/dL (ref 0.3–1.2)
Total Protein: 6.5 g/dL (ref 6.5–8.1)

## 2016-07-17 LAB — IRON AND TIBC
IRON: 8 ug/dL — AB (ref 28–170)
Saturation Ratios: 4 % — ABNORMAL LOW (ref 10.4–31.8)
TIBC: 197 ug/dL — AB (ref 250–450)
UIBC: 189 ug/dL

## 2016-07-17 LAB — CBC
HCT: 26.7 % — ABNORMAL LOW (ref 36.0–46.0)
Hemoglobin: 8.3 g/dL — ABNORMAL LOW (ref 12.0–15.0)
MCH: 27.8 pg (ref 26.0–34.0)
MCHC: 31.1 g/dL (ref 30.0–36.0)
MCV: 89.3 fL (ref 78.0–100.0)
PLATELETS: 580 10*3/uL — AB (ref 150–400)
RBC: 2.99 MIL/uL — AB (ref 3.87–5.11)
RDW: 18.9 % — AB (ref 11.5–15.5)
WBC: 13.7 10*3/uL — ABNORMAL HIGH (ref 4.0–10.5)

## 2016-07-17 LAB — VITAMIN B12: VITAMIN B 12: 647 pg/mL (ref 180–914)

## 2016-07-17 LAB — FERRITIN: FERRITIN: 346 ng/mL — AB (ref 11–307)

## 2016-07-17 LAB — MRSA PCR SCREENING: MRSA by PCR: POSITIVE — AB

## 2016-07-17 LAB — FOLATE: Folate: 57 ng/mL (ref >5.9)

## 2016-07-17 MED ORDER — CHLORHEXIDINE GLUCONATE CLOTH 2 % EX PADS
6.0000 | MEDICATED_PAD | Freq: Every day | CUTANEOUS | Status: AC
  Administered 2016-07-17 – 2016-07-21 (×5): 6 via TOPICAL

## 2016-07-17 MED ORDER — MUPIROCIN 2 % EX OINT
1.0000 "application " | TOPICAL_OINTMENT | Freq: Two times a day (BID) | CUTANEOUS | Status: AC
  Administered 2016-07-17 – 2016-07-21 (×10): 1 via NASAL
  Filled 2016-07-17: qty 22

## 2016-07-17 NOTE — Progress Notes (Signed)
[facility]  PROGRESS NOTE  Katherine AhmadiDiane Palmer RUE:[id number] DOB: [birth year] DOA: 07/16/2016 PCP: Kirt Boysarter, Monica, DO   Brief HPI:   55 yo female with history of CVA with left hemiparesis, with left AKA, stage IV decub ulcer with colostomy and PEG tube for feeding due to dysphagia, seizure disorder and HTN. She was brought to the ED for "abnormal Labs" Patient denied any complaints and she does not know why labs were drawn but she has been feeling "down" for the past few days. She was found to be hyperkalemic. She had IVF, Ca gluconate and regular insulin in the ER and hospitalist service was consulted to admit for hyperkalemia   Subjective   Patient denies any pain this morning, no chest pain or shortness of breath. Potassium this morning 5.1.   Assessment/Plan:     1. Hyperkalemia- resolved, today  potassium is 5.1. Etiology unclear at this time. Follow BMP in a.m. 2. Anemia- no bleeding history, likely due to chronic disease. Status post 1 unit PRBC. Today hemoglobin is 8.3. 3. UTI- had abnormal UA on admission, started on ceftriaxone. Follow urine culture results. 4. History of seizure- continue Depakote 5. Hypertension- home medications on hold.   DVT prophylaxis: Lovenox  Code Status: Full code  Family Communication: No family present at bedside   Disposition Plan: to be decided   Consultants:  None   Procedures:  None   Antibiotics:   Anti-infectives    Start     Dose/Rate Route Frequency Ordered Stop   07/16/16 2300  vancomycin (VANCOCIN) IVPB 750 mg/150 ml premix     750 mg 150 mL/hr over 60 Minutes Intravenous Every 24 hours 07/16/16 2202     07/16/16 2230  cefTRIAXone (ROCEPHIN) 1 g in dextrose 5 % 50 mL IVPB     1 g 100 mL/hr over 30 Minutes Intravenous Every 24 hours 07/16/16 2202         Objective   Vitals:   07/17/16 0012 07/17/16 0214 07/17/16 0243 07/17/16 0545  BP: 128/68 116/68 122/70 117/66  Pulse: 95 (!) 101 (!) 101 (!) 104  Resp:  20 20 20 18   Temp: 98.9 F (37.2 C) 98.9 F (37.2 C) 99 F (37.2 C) 99.9 F (37.7 C)  TempSrc: Oral Oral Oral Oral  SpO2: 100% 100% 100% 99%  Weight: 47.6 kg (104 lb 15 oz)     Height: 5' 6.5" (1.689 m)       Intake/Output Summary (Last 24 hours) at 07/17/16 1301 Last data filed at 07/17/16 0800  Gross per 24 hour  Intake          1257.75 ml  Output              400 ml  Net           857.75 ml   Filed Weights   07/17/16 0012  Weight: 47.6 kg (104 lb 15 oz)     Physical Examination:  General exam: Appears calm and comfortable. Respiratory system: Clear to auscultation. Respiratory effort normal. Cardiovascular system:  RRR. No  murmurs, rubs, gallops. No pedal edema. GI system: Abdomen is nondistended, soft, non tender to palpation. Psychiatry: Alert, oriented x 3.Judgement and insight appear normal. Affect normal.    Data Reviewed: I have personally reviewed following labs and imaging studies  CBG: No results for input(s): GLUCAP in the last 168 hours.  CBC:  Recent Labs Lab 07/16/16 07/16/16 1822 07/16/16 2017 07/17/16 0830  WBC 16.5  --  11.8* 13.7*  NEUTROABS  --   --  7.5  --   HGB 7.7* 8.8* 6.6* 8.3*  HCT 26* 26.0* 21.7* 26.7*  MCV  --   --  91.6 89.3  PLT 601*  --  567* 580*    Basic Metabolic Panel:  Recent Labs Lab 07/16/16 07/16/16 1822 07/16/16 2017 07/17/16 0830  NA 133* 128* 133* 135  K 6.9* 7.2* 5.2* 5.1  CL  --  98* 102 102  CO2  --   --  26 23  GLUCOSE  --  87 156* 137*  BUN 62* 85* 61* 46*  CREATININE 0.4* 0.70 0.48 0.40*  CALCIUM  --   --  11.0* 8.4*    Recent Results (from the past 240 hour(s))  MRSA PCR Screening     Status: Abnormal   Collection Time: 07/17/16  1:01 AM  Result Value Ref Range Status   MRSA by PCR POSITIVE (A) NEGATIVE Final    Comment:        The GeneXpert MRSA Assay (FDA approved for NASAL specimens only), is one component of a comprehensive MRSA colonization surveillance program. It is  not intended to diagnose MRSA infection nor to guide or monitor treatment for MRSA infections. RESULT CALLED TO, READ BACK BY AND VERIFIED WITH: L VERGELVION RN 0501 07/17/16 A NAVARRO      Liver Function Tests:  Recent Labs Lab 07/16/16 07/16/16 2017 07/17/16 0830  AST 12* 25 20  ALT 11 11* 13*  ALKPHOS 127* 90 95  BILITOT  --  0.7 0.7  PROT  --  6.0* 6.5  ALBUMIN  --  1.5* 1.6*      Studies: No results found.  Scheduled Meds: . aspirin EC  81 mg Oral Daily  . cefTRIAXone (ROCEPHIN)  IV  1 g Intravenous Q24H  . Chlorhexidine Gluconate Cloth  6 each Topical Q0600  . enoxaparin (LOVENOX) injection  40 mg Subcutaneous QHS  . escitalopram  10 mg Per Tube Daily  . feeding supplement (PRO-STAT SUGAR FREE 64)  30 mL Per Tube TID WC  . mouth rinse  15 mL Mouth Rinse BID  . multivitamin with minerals  1 tablet Oral Daily  . mupirocin ointment  1 application Nasal BID  . pantoprazole sodium  40 mg Per Tube Daily  . pravastatin  40 mg Per Tube q1800  . sodium chloride flush  3 mL Intravenous Q12H  . Valproate Sodium  200 mg Oral Q8H  . vancomycin  750 mg Intravenous Q24H    Continuous Infusions: . sodium chloride 75 mL/hr at 07/17/16 0545  . feeding supplement (OSMOLITE 1.5 CAL) 1,000 mL (07/16/16 2200)    Time spent: 25 min  [facility] IsraelAMA,Nolen Lindamood S   [facility] Pager [phone]. If 7PM-7AM, please contact night-coverage at [email or web address], Office  [phone]  password [email or web address] 07/17/2016, 1:01 PM  LOS: 0 days

## 2016-07-17 NOTE — Plan of Care (Signed)
Problem: Pain Managment: Goal: General experience of comfort will improve Outcome: Progressing Pt c/o pain in bilateral hips. Pain med given, will f/u

## 2016-07-17 NOTE — Progress Notes (Signed)
Representative called requesting admission information on patient. Representative Sewan RN made aware her request for patient information would need to be verified prior to call back to ensure medical information be given appropriately. AC contacted, phone number for facility verified [phone]. AC attempted to place follow call twice, line rang busy.

## 2016-07-17 NOTE — Care Management Note (Signed)
Case Management Note  Patient Details  Name: Katherine Palmer MRN: [id number] Date of Birth: Nov 12, 1960  Subjective/Objective:   55 y/o f admitted w/Acute hyperkalemia. Hx: CVA, L AKA, colostomy, PEG, sacral wound. From SNF- CSW following.                 Action/Plan:d/c plan return SNF.   Expected Discharge Date:   (unknown)               Expected Discharge Plan:  Skilled Nursing Facility  In-House Referral:  Clinical Social Work  Discharge planning Services  CM Consult  Post Acute Care Choice:    Choice offered to:     DME Arranged:    DME Agency:     HH Arranged:    HH Agency:     Status of Service:  In process, will continue to follow  If discussed at Long Length of Stay Meetings, dates discussed:    Additional Comments:  Lanier ClamMahabir, Ysabel Stankovich, RN 07/17/2016, 12:22 PM

## 2016-07-17 NOTE — Progress Notes (Signed)
Initial Nutrition Assessment  DOCUMENTATION CODES:   Severe malnutrition in context of acute illness/injury, Underweight  INTERVENTION:  - Recommend decrease TF regimen to: Osmolite 1.5 @ 55 mL/hr with 30 Prostat once/day. This regimen will provide 2080 kcal (109% max estimated kcal need), 98 grams of protein (103% max estimated protein need), and 1006 mL free water.  - Free water flush per MD. - RD will follow-up 11/3.  NUTRITION DIAGNOSIS:   Malnutrition related to chronic illness as evidenced by severe depletion of body fat, mild depletion of muscle mass, moderate depletions of muscle mass.  GOAL:   Patient will meet greater than or equal to 90% of their needs  MONITOR:   TF tolerance, PO intake, Weight trends, Labs, Skin, I & O's  REASON FOR ASSESSMENT:   Malnutrition Screening Tool  ASSESSMENT:   55 yo female with history of CVA with left hemiparesis, with left AKA, stage IV decub ulcer with colostomy and PEG tube for feeding due to dysphagia, seizure disorder and HTN. She was brought to the ED for "abnormal Labs" Patient denied any complaints and she does not know why labs were drawn but she has been feeling "down" for the past few days. She was found to be hyperkalemic. She had IVF, Ca gluconate and regular insulin in the ER and hospitalist service was consulted to admit for hyperkalemia.   Pt seen for MST. BMI indicates underweight status. Pt on Heart Healthy diet and consumed 25% of breakfast this AM. Pt reports that she typically drinks more than she eats; unable to obtain clear answer on chewing abilities during this visit. RN at bedside reports that pt had difficulty with swallowing pills this AM and that pills this afternoon to be given via PEG. RN reports no issues with TF infusing via PEG.   Physical assessment shows severe fat wasting and moderate and mild muscle wasting to upper body; lower body not assessed at this time. Per chart review, pt has lost 5 lbs (5% body  weight) in the past 3 months which is not significant for time frame. Question weights on 8/1 and 8/3 as they are widely different from weight on 8/10. Will continue to monitor weight trends during hospitalization.   Currently ordered via PEG: Osmolite 1.5 @ 60 mL/hr with 30 mL Prostat TID. This regimen is providing 2460 kcal (129% max estimated kcal need), 135 grams of protein (142% max estimated protein need), and 1097 mL free water. TF recommendations outlined above to more appropriately meet estimated nutrition needs based on pt's PMH and wounds.  Medications reviewed; PRN Dulcolax, 1 g Ca gluconate x1 dose yesterday, 10 units Novolog x1 dose yesterday, PRN MOM, daily multivitamin with minerals, 40 mg Protonix.day, 50 mEq IV sodium bicarb x1 dose yesterday, 15 g Kayexalate x1 doe yesterday.   Labs reviewed; BUN: 46 mg/dL, creatinine: 0.4 mg/dL, Ca: 8.4 mg/dL. IVF: NS @ 75 mL/hr.     Diet Order:  Diet Heart Room service appropriate? Yes; Fluid consistency: Thin  Skin:  Wound (see comment) (Stage 4 sacral, stage 3 mid, lower back, and stage 2 R leg pressure injuries)  Last BM:  PTA/unknown  Height:   Ht Readings from Last 1 Encounters:  07/17/16 5' 6.5" (1.689 m)    Weight:   Wt Readings from Last 1 Encounters:  07/17/16 104 lb 15 oz (47.6 kg)    Ideal Body Weight:  60.23 kg  BMI:  Body mass index is 16.68 kg/m.  Estimated Nutritional Needs:   Kcal:  [id number] (35-40 kcal/kg)  Protein:  81-95 grams (1.7-2 grams/kg)  Fluid:  >/= 1.7 L/day  EDUCATION NEEDS:   No education needs identified at this time    Trenton GammonJessica Acasia Skilton, MS, RD, LDN Inpatient Clinical Dietitian Pager # [phone] After hours/weekend pager # [phone]

## 2016-07-17 NOTE — Consult Note (Signed)
WOC Nurse wound consult note Reason for Consult:Sacral/ Rt Ischium and mid-back wound/ Rt lateral lower leg/ Lt ischium/ Rt trochanter Wound type:Chronic Stage 4 Sacral wound, Lt ischium stage 3, Rt trochanter medical adhesive related skin injury, Stage 3 to Midback, stage 3 rt lateral lower leg Pressure Ulcer POA: Yes Measurement:sacrum 8.5x23x0.2, lt ischium 2x3x0.1, Rt trochanger 2x2 scattered denuded skin, rt lat lower leg 3x1x0.1  Wound WUJ:WJXBJYbed:sacrum red granulation, lt ischuim red granulation, rt trochanter areas of denuded skin, midback yellow slough, rt lat lower leg pale pink nongranulating  Drainage (amount, consistency, odor) Sacrum small amount bloody drainage to vac canister. Mid back moderate yellow drainage.  Periwound:Intact to all wounds Dressing procedure/placement/frequency:Sacrum and lt ischium 3 piece of black foam applied to wound bed, covered with drape and applied trac pad over ischial wound to bridge all of the sacral area.  Foam border to midback, rt lat lower leg, rt trochanter.   WOC Nurse ostomy consult note Stoma type/location: RLQ double barrel ileostomy  Stomal assessment/size: Stoma pink healthy moist tissue/oval 1in on top half and 0.75 in on bottom.  Peristomal assessment: Small are of denuded skin above and below stoma, applied skin prep to area.  Treatment options for stomal/peristomal skin: Applied skin prep and barrier ring to peristomal skin.  Output: large amount yellow liquid stool  Ostomy pouching: 2pc.Flat pouch with barrier ring.   [place] [facility] [employer] [place] program:No Amparo BristolJennifer Clay [facility] WOC team will follow for Dressing vac changes.  Maple HudsonKaren Shloimy Michalski RN BSN CWON Pager [phone]

## 2016-07-17 NOTE — Progress Notes (Signed)
IV team consulted for 2nd IV site needed for blood transfusion. IV team had a hard time while patient was tried at ED. Patient only has one IV site. Plan to complete infusing 2 of her IV antibiotics now then blood transfusion.to follow after.

## 2016-07-17 NOTE — Plan of Care (Signed)
Problem: Activity: Goal: Risk for activity intolerance will decrease Outcome: Adequate for Discharge Pt has contracted ble. Bed  bound

## 2016-07-18 DIAGNOSIS — T83511A Infection and inflammatory reaction due to indwelling urethral catheter, initial encounter: Secondary | ICD-10-CM

## 2016-07-18 DIAGNOSIS — N390 Urinary tract infection, site not specified: Secondary | ICD-10-CM

## 2016-07-18 LAB — TYPE AND SCREEN
ABO/RH(D): O POS
ANTIBODY SCREEN: NEGATIVE
Unit division: 0

## 2016-07-18 LAB — HAPTOGLOBIN: HAPTOGLOBIN: 367 mg/dL — AB (ref 34–200)

## 2016-07-18 NOTE — Progress Notes (Signed)
Pharmacy Antibiotic Note  Katherine Palmer is a 55 y.o. female admitted on 07/16/2016 with hyperkalemia. Pharmacy consulted to dose Vancomycin on admission for cellulitis & Rocephin for UTI. Vancomycin was discontinued today 11/2.  Plan:  DC vancomycin  Rocephin 1gm IV q24h - no further dosing adjustments needed, Pharmacy will sign-off  Height: 5' 6.5" (168.9 cm) Weight: 104 lb 15 oz (47.6 kg) IBW/kg (Calculated) : 60.45  Temp (24hrs), Avg:98.2 F (36.8 C), Min:98 F (36.7 C), Max:98.3 F (36.8 C)   Recent Labs Lab 07/16/16 07/16/16 1822 07/16/16 2017 07/17/16 0830  WBC 16.5  --  11.8* 13.7*  CREATININE 0.4* 0.70 0.48 0.40*  LATICACIDVEN  --  1.84  --   --     Estimated Creatinine Clearance: 59 mL/min (by C-G formula based on SCr of 0.4 mg/dL (L)).    No Known Allergies  Antimicrobials this admission: Vanc 10/31  >> 11/2 Rocephin 10/31 >>   Dose adjustments this admission:  Microbiology results: 10/31 BCx: ngtd 11/1 MRSA PCR (+) 11/2 UCx: sent after abx started  Thank you for allowing pharmacy to be a part of this patient's care.  Loralee PacasErin Nicoya Friel, PharmD, BCPS Pager: [phone] 07/18/2016 2:58 PM

## 2016-07-18 NOTE — Progress Notes (Addendum)
[facility]  PROGRESS NOTE  Katherine AhmadiDiane Montemayor UJW:[id number] DOB: 10/16/1960 DOA: 07/16/2016 PCP: Kirt Boysarter, Monica, DO   Brief HPI:   55 yo female with history of CVA with left hemiparesis, with left AKA, stage IV decub ulcer with colostomy and PEG tube for feeding due to dysphagia, seizure disorder and HTN. She was brought to the ED for "abnormal Labs" Patient denied any complaints and she does not know why labs were drawn but she has been feeling "down" for the past few days. She was found to be hyperkalemic. She had IVF, Ca gluconate and regular insulin in the ER and hospitalist service was consulted to admit for hyperkalemia   Subjective   Patient is confused this morning.    Assessment/Plan:     1. Hyperkalemia- resolved, today  potassium is 5.1. Etiology unclear at this time. Follow BMP in a.m. 2. Altered mental status- Mild confusion, likely from UTI. Called and discussed with son, as per son she gets episodes of intermittent confusion. 3. Anemia- no bleeding history, likely due to chronic disease. Status post 1 unit PRBC. Today hemoglobin is 8.3. 4. UTI- had abnormal UA on admission, started on ceftriaxone. Will obtain urine culture. Will discontinue vancomycin. 5. History of seizure- continue Depakote 6. Hypertension- home medications on hold.   DVT prophylaxis: Lovenox  Code Status: Full code  Family Communication: No family present at bedside   Disposition Plan: to be decided   Consultants:  None   Procedures:  None   Antibiotics:   Anti-infectives    Start     Dose/Rate Route Frequency Ordered Stop   07/16/16 2300  vancomycin (VANCOCIN) IVPB 750 mg/150 ml premix     750 mg 150 mL/hr over 60 Minutes Intravenous Every 24 hours 07/16/16 2202     07/16/16 2230  cefTRIAXone (ROCEPHIN) 1 g in dextrose 5 % 50 mL IVPB     1 g 100 mL/hr over 30 Minutes Intravenous Every 24 hours 07/16/16 2202         Objective   Vitals:   07/17/16 0545 07/17/16 1335  07/17/16 2057 07/18/16 0535  BP: 117/66 125/77 112/65 (!) 133/91  Pulse: (!) 104 (!) 106 (!) 101 (!) 102  Resp: 18 19 19 20   Temp: 99.9 F (37.7 C) 99.6 F (37.6 C) 98.3 F (36.8 C) 98.2 F (36.8 C)  TempSrc: Oral Axillary Axillary Oral  SpO2: 99% 99% 99% 99%  Weight:      Height:        Intake/Output Summary (Last 24 hours) at 07/18/16 1245 Last data filed at 07/18/16 0603  Gross per 24 hour  Intake          3866.75 ml  Output             1375 ml  Net          2491.75 ml   Filed Weights   07/17/16 0012  Weight: 47.6 kg (104 lb 15 oz)     Physical Examination:  General exam: Appears calm and comfortable. Respiratory system: Clear to auscultation. Respiratory effort normal. Cardiovascular system:  RRR. No  murmurs, rubs, gallops. No pedal edema. GI system: Abdomen is nondistended, soft, non tender to palpation. Psychiatry: Alert, oriented x 2. Pleasantly confused.    Data Reviewed: I have personally reviewed following labs and imaging studies  CBG: No results for input(s): GLUCAP in the last 168 hours.  CBC:  Recent Labs Lab 07/16/16 07/16/16 1822 07/16/16 2017 07/17/16 0830  WBC 16.5  --  11.8* 13.7*  NEUTROABS  --   --  7.5  --   HGB 7.7* 8.8* 6.6* 8.3*  HCT 26* 26.0* 21.7* 26.7*  MCV  --   --  91.6 89.3  PLT 601*  --  567* 580*    Basic Metabolic Panel:  Recent Labs Lab 07/16/16 07/16/16 1822 07/16/16 2017 07/17/16 0830  NA 133* 128* 133* 135  K 6.9* 7.2* 5.2* 5.1  CL  --  98* 102 102  CO2  --   --  26 23  GLUCOSE  --  87 156* 137*  BUN 62* 85* 61* 46*  CREATININE 0.4* 0.70 0.48 0.40*  CALCIUM  --   --  11.0* 8.4*    Recent Results (from the past 240 hour(s))  Culture, blood (routine x 2)     Status: None (Preliminary result)   Collection Time: 07/16/16 10:00 PM  Result Value Ref Range Status   Specimen Description BLOOD RIGHT WRIST  Final   Special Requests BOTTLES DRAWN AEROBIC AND ANAEROBIC 5CC  Final   Culture   Final    NO  GROWTH < 24 HOURS Performed at [facility]    Report Status PENDING  Incomplete  Culture, blood (routine x 2)     Status: None (Preliminary result)   Collection Time: 07/16/16 10:07 PM  Result Value Ref Range Status   Specimen Description BLOOD BLOOD LEFT FOREARM  Final   Special Requests IN PEDIATRIC BOTTLE 2CC  Final   Culture   Final    NO GROWTH < 24 HOURS Performed at [facility]    Report Status PENDING  Incomplete  MRSA PCR Screening     Status: Abnormal   Collection Time: 07/17/16  1:01 AM  Result Value Ref Range Status   MRSA by PCR POSITIVE (A) NEGATIVE Final    Comment:        The GeneXpert MRSA Assay (FDA approved for NASAL specimens only), is one component of a comprehensive MRSA colonization surveillance program. It is not intended to diagnose MRSA infection nor to guide or monitor treatment for MRSA infections. RESULT CALLED TO, READ BACK BY AND VERIFIED WITH: L VERGELVION RN 0501 07/17/16 A NAVARRO      Liver Function Tests:  Recent Labs Lab 07/16/16 07/16/16 2017 07/17/16 0830  AST 12* 25 20  ALT 11 11* 13*  ALKPHOS 127* 90 95  BILITOT  --  0.7 0.7  PROT  --  6.0* 6.5  ALBUMIN  --  1.5* 1.6*      Studies: No results found.  Scheduled Meds: . aspirin EC  81 mg Oral Daily  . cefTRIAXone (ROCEPHIN)  IV  1 g Intravenous Q24H  . Chlorhexidine Gluconate Cloth  6 each Topical Q0600  . enoxaparin (LOVENOX) injection  40 mg Subcutaneous QHS  . escitalopram  10 mg Per Tube Daily  . feeding supplement (PRO-STAT SUGAR FREE 64)  30 mL Per Tube TID WC  . mouth rinse  15 mL Mouth Rinse BID  . multivitamin with minerals  1 tablet Oral Daily  . mupirocin ointment  1 application Nasal BID  . pantoprazole sodium  40 mg Per Tube Daily  . pravastatin  40 mg Per Tube q1800  . sodium chloride flush  3 mL Intravenous Q12H  . Valproate Sodium  200 mg Oral Q8H  . vancomycin  750 mg Intravenous Q24H    Continuous Infusions: . sodium chloride  75 mL/hr at 07/17/16 0545  . feeding supplement (OSMOLITE  1.5 CAL) 1,000 mL (07/18/16 0453)    Time spent: 25 min  [facility] HospitalAMA,Darria Corvera S   [facility] Pager [phone]. If 7PM-7AM, please contact night-coverage at [email or web address], Office  [phone]  password [email or web address] 07/18/2016, 12:45 PM  LOS: 1 day

## 2016-07-19 DIAGNOSIS — N390 Urinary tract infection, site not specified: Secondary | ICD-10-CM

## 2016-07-19 LAB — BASIC METABOLIC PANEL
ANION GAP: 6 (ref 5–15)
BUN: 23 mg/dL — ABNORMAL HIGH (ref 6–20)
CALCIUM: 8.4 mg/dL — AB (ref 8.9–10.3)
CHLORIDE: 107 mmol/L (ref 101–111)
CO2: 24 mmol/L (ref 22–32)
Glucose, Bld: 130 mg/dL — ABNORMAL HIGH (ref 65–99)
Potassium: 4.9 mmol/L (ref 3.5–5.1)
SODIUM: 137 mmol/L (ref 135–145)

## 2016-07-19 LAB — CBC
HEMATOCRIT: 26.1 % — AB (ref 36.0–46.0)
HEMOGLOBIN: 7.9 g/dL — AB (ref 12.0–15.0)
MCH: 27.4 pg (ref 26.0–34.0)
MCHC: 30.3 g/dL (ref 30.0–36.0)
MCV: 90.6 fL (ref 78.0–100.0)
Platelets: 531 10*3/uL — ABNORMAL HIGH (ref 150–400)
RBC: 2.88 MIL/uL — AB (ref 3.87–5.11)
RDW: 18.2 % — ABNORMAL HIGH (ref 11.5–15.5)
WBC: 13.8 10*3/uL — AB (ref 4.0–10.5)

## 2016-07-19 MED ORDER — OSMOLITE 1.5 CAL PO LIQD
1000.0000 mL | ORAL | Status: DC
  Administered 2016-07-20 – 2016-07-21 (×3): 1000 mL
  Filled 2016-07-19 (×6): qty 1000

## 2016-07-19 MED ORDER — OSMOLITE 1.5 CAL PO LIQD
1000.0000 mL | ORAL | Status: DC
  Filled 2016-07-19: qty 1000

## 2016-07-19 MED ORDER — PRO-STAT SUGAR FREE PO LIQD
30.0000 mL | Freq: Every day | ORAL | Status: DC
  Administered 2016-07-20 – 2016-07-22 (×3): 30 mL
  Filled 2016-07-19 (×3): qty 30

## 2016-07-19 NOTE — Consult Note (Signed)
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  4 pieces of black foam used to cover wounds, drape used to picture frame around the wound and protect periwound tissue.  Attached to NPWT and an immediate seal is achieved.  Patient is positioned on the left side with call light within reach.  Next scheduled dressing change is on Monday, 07/22/16.  If dressing is compromised over the weekend, RN is encouraged to remove VAC and place a NS moistened gauze dressing topped with ABDs over the wounds and to secure them with paper tape, changing twice daily and as needed.   WOC Nurse ostomy follow up Stoma type/location: RLQ ileostomy Stomal assessment/size: 1 and 3/4 inch round, red, moist  Peristomal assessment: intact Treatment options for stomal/peristomal skin: skin barrier ring Output Light brown stool Ostomy pouching: 2 piece 2 and 1/4 inch pouching system with skin barrier ring  Education provided: None. [place] in [place] Secure [place] Discharge program: No   WOC nursing team will follow along to assist with VAC changes every M/W/F as needed, and ostomy pouch changes as available.  We will remain available to this patient, the nursing and medical teams.   Thanks, Ladona MowLaurie Kash Mothershead, MSN, RN, GNP, Hans EdenCWOCN, CWON-AP, FAAN  Pager# [phone]

## 2016-07-19 NOTE — Clinical Social Work Note (Signed)
Clinical Social Work Assessment  Patient Details  Name: Katherine AhmadiDiane Bangert MRN: [id number] Date of Birth: [birth year]  Date of referral:  07/19/16               Reason for consult:  Facility Placement                Permission sought to share information with:  Facility Industrial/product designerContact Representative Permission granted to share information::  Yes, Verbal Permission Granted  Name::        Agency::     Relationship::     Contact Information:     Housing/Transportation Living arrangements for the past 2 months:  Skilled Nursing Facility Source of Information:  Adult Children Patient Interpreter Needed:  None Criminal Activity/Legal Involvement Pertinent to Current Situation/Hospitalization:  No - Comment as needed Significant Relationships:  Adult Children Lives with:  Facility Resident Do you feel safe going back to the place where you live?  Yes Need for family participation in patient care:  Yes (Comment)  Care giving concerns:  CSW received consult that [facility]    Social Worker assessment / plan:  CSW confirmed with patient's son, Fayrene FearingJames that [facility] - anticipating possible return Saturday, 11/4 - Tammy at SNF made aware.   Employment status:  Disabled (Comment on whether or not currently receiving Disability) Insurance information:  [place]  Not assessed at this time Information / Referral to community resources:  Skilled Nursing Facility  Patient/Family's Response to care:    Patient/Family's Understanding of and Emotional Response to Diagnosis, Current Treatment, and Prognosis:    Emotional Assessment Appearance:  Appears stated age Attitude/Demeanor/Rapport:    Affect (typically observed):    Orientation:  Oriented to Self Alcohol / Substance use:    Psych involvement (Current and /or in the community):     Discharge Needs  Concerns to be addressed:    Readmission  within the last 30 days:    Current discharge risk:    Barriers to Discharge:      Arlyss RepressHarrison, Damian Hofstra F, LCSW 07/19/2016, 3:01 PM

## 2016-07-19 NOTE — Progress Notes (Signed)
[facility]  PROGRESS NOTE  Katherine Palmer ZOX:[id number] DOB: 1960-12-19 DOA: 07/16/2016 PCP: Kirt Boys, DO   Brief HPI:   55 yo female with history of CVA with left hemiparesis, with left AKA, stage IV decub ulcer with colostomy and PEG tube for feeding due to dysphagia, seizure disorder and HTN. She was brought to the ED for "abnormal Labs" Patient denied any complaints and she does not know why labs were drawn but she has been feeling "down" for the past few days. She was found to be hyperkalemic. She had IVF, Ca gluconate and regular insulin in the ER and hospitalist service was consulted to admit for hyperkalemia   Subjective   Patient is confused this morning.    Assessment/Plan:     1. Hyperkalemia- resolved, today  potassium is 4.9 2. Altered mental status- Mild confusion, likely from UTI. Called and discussed with son, as per son she gets episodes of intermittent confusion. 3. Anemia- no bleeding history, likely due to chronic disease. Status post 1 unit PRBC. Today hemoglobin is 7.9.  4. UTI- had abnormal UA on admission, started on ceftriaxone. Follow urine culture. Will discontinue vancomycin. 5. History of seizure- continue Depakote 6. Hypertension- home medications on hold.   DVT prophylaxis: Lovenox  Code Status: Full code  Family Communication: No family present at bedside   Disposition Plan: to be decided   Consultants:  None   Procedures:  None   Antibiotics:   Anti-infectives    Start     Dose/Rate Route Frequency Ordered Stop   07/16/16 2300  vancomycin (VANCOCIN) IVPB 750 mg/150 ml premix  Status:  Discontinued     750 mg 150 mL/hr over 60 Minutes Intravenous Every 24 hours 07/16/16 2202 07/18/16 1248   07/16/16 2230  cefTRIAXone (ROCEPHIN) 1 g in dextrose 5 % 50 mL IVPB     1 g 100 mL/hr over 30 Minutes Intravenous Every 24 hours 07/16/16 2202         Objective   Vitals:   07/18/16 0535 07/18/16 1245 07/18/16 2209 07/19/16  0658  BP: (!) 133/91 132/77 127/84 129/79  Pulse: (!) 102 99 (!) 102 100  Resp: 20 18 17 17   Temp: 98.2 F (36.8 C) 98 F (36.7 C) 98.1 F (36.7 C) 98.4 F (36.9 C)  TempSrc: Oral Oral Oral Oral  SpO2: 99% 100% 100% 100%  Weight:      Height:        Intake/Output Summary (Last 24 hours) at 07/19/16 0953 Last data filed at 07/19/16 0600  Gross per 24 hour  Intake             3212 ml  Output             1155 ml  Net             2057 ml   Filed Weights   07/17/16 0012  Weight: 47.6 kg (104 lb 15 oz)     Physical Examination:  General exam: Appears calm and comfortable. Respiratory system: Clear to auscultation. Respiratory effort normal. Cardiovascular system:  RRR. No  murmurs, rubs, gallops. No pedal edema. GI system: Abdomen is nondistended, soft, non tender to palpation. Psychiatry: Alert, oriented x 2. Pleasantly confused.    Data Reviewed: I have personally reviewed following labs and imaging studies  CBG: No results for input(s): GLUCAP in the last 168 hours.  CBC:  Recent Labs Lab 07/16/16 07/16/16 1822 07/16/16 2017 07/17/16 0830 07/19/16 0443  WBC 16.5  --  11.8* 13.7* 13.8*  NEUTROABS  --   --  7.5  --   --   HGB 7.7* 8.8* 6.6* 8.3* 7.9*  HCT 26* 26.0* 21.7* 26.7* 26.1*  MCV  --   --  91.6 89.3 90.6  PLT 601*  --  567* 580* 531*    Basic Metabolic Panel:  Recent Labs Lab 07/16/16 07/16/16 1822 07/16/16 2017 07/17/16 0830 07/19/16 0443  NA 133* 128* 133* 135 137  K 6.9* 7.2* 5.2* 5.1 4.9  CL  --  98* 102 102 107  CO2  --   --  26 23 24   GLUCOSE  --  87 156* 137* 130*  BUN 62* 85* 61* 46* 23*  CREATININE 0.4* 0.70 0.48 0.40* <0.30*  CALCIUM  --   --  11.0* 8.4* 8.4*    Recent Results (from the past 240 hour(s))  Culture, blood (routine x 2)     Status: None (Preliminary result)   Collection Time: 07/16/16 10:00 PM  Result Value Ref Range Status   Specimen Description BLOOD RIGHT WRIST  Final   Special Requests BOTTLES DRAWN  AEROBIC AND ANAEROBIC 5CC  Final   Culture   Final    NO GROWTH < 24 HOURS [facility]    Report Status PENDING  Incomplete  Culture, blood (routine x 2)     Status: None (Preliminary result)   Collection Time: 07/16/16 10:07 PM  Result Value Ref Range Status   Specimen Description BLOOD BLOOD LEFT FOREARM  Final   Special Requests IN PEDIATRIC BOTTLE 2CC  Final   Culture   Final    NO GROWTH < 24 HOURS [facility]    Report Status PENDING  Incomplete  MRSA PCR Screening     Status: Abnormal   Collection Time: 07/17/16  1:01 AM  Result Value Ref Range Status   MRSA by PCR POSITIVE (A) NEGATIVE Final    Comment:        The GeneXpert MRSA Assay (FDA approved for NASAL specimens only), is one component of a comprehensive MRSA colonization surveillance program. It is not intended to diagnose MRSA infection nor to guide or monitor treatment for MRSA infections. RESULT CALLED TO, READ BACK BY AND VERIFIED WITH: L VERGELVION RN 0501 07/17/16 A NAVARRO      Liver Function Tests:  Recent Labs Lab 07/16/16 07/16/16 2017 07/17/16 0830  AST 12* 25 20  ALT 11 11* 13*  ALKPHOS 127* 90 95  BILITOT  --  0.7 0.7  PROT  --  6.0* 6.5  ALBUMIN  --  1.5* 1.6*      Studies: No results found.  Scheduled Meds: . aspirin EC  81 mg Oral Daily  . cefTRIAXone (ROCEPHIN)  IV  1 g Intravenous Q24H  . Chlorhexidine Gluconate Cloth  6 each Topical Q0600  . enoxaparin (LOVENOX) injection  40 mg Subcutaneous QHS  . escitalopram  10 mg Per Tube Daily  . feeding supplement (PRO-STAT SUGAR FREE 64)  30 mL Per Tube TID WC  . mouth rinse  15 mL Mouth Rinse BID  . multivitamin with minerals  1 tablet Oral Daily  . mupirocin ointment  1 application Nasal BID  . pantoprazole sodium  40 mg Per Tube Daily  . pravastatin  40 mg Per Tube q1800  . sodium chloride flush  3 mL Intravenous Q12H  . Valproate Sodium  200 mg Oral Q8H    Continuous  Infusions: . sodium chloride  75 mL/hr at 07/19/16 0002  . feeding supplement (OSMOLITE 1.5 CAL) 1,000 mL (07/19/16 0001)    Time spent: 25 min  [facility],Dewana Ammirati S   [facility] Pager [phone]. If 7PM-7AM, please contact night-coverage at [email or web address], Office  [phone]  password [email or web address] 07/19/2016, 9:53 AM  LOS: 2 days

## 2016-07-19 NOTE — NC FL2 (Signed)
[place] MEDICAID FL2 LEVEL OF CARE SCREENING TOOL     IDENTIFICATION  Patient Name: Katherine Palmer Birthdate: 09/23/1960 Sex: female Admission Date (Current Location): 07/16/2016  [place]  [place] [id number] N Facility and Address:  [facility],  [address], [place] [id number]      Provider Number: [id number]  Attending Physician Name and Address:  Meredeth IdeGagan S Lama, MD  Relative Name and Phone Number:       Current Level of Care: Hospital Recommended Level of Care: Skilled Nursing Facility Prior Approval Number:    Date Approved/Denied:   PASRR Number: [id number]  Discharge Plan: SNF    Current Diagnoses: Patient Active Problem List   Diagnosis Date Noted  . UTI (urinary tract infection) 07/18/2016  . Pressure injury of skin 07/17/2016  . Acute hyperkalemia 07/16/2016  . Protein-calorie malnutrition, severe 05/21/2016  . Stage IV decubitus ulcer (HCC) 05/18/2016  . Cerebral infarction (HCC)   . Hx of AKA (above knee amputation) (HCC)   . Hemiparesis affecting left side as late effect of cerebrovascular accident (CVA) (HCC) 04/11/2016  . Essential hypertension, benign 04/11/2016  . Failure to thrive syndrome, adult 04/11/2016  . Dysphagia due to old cerebrovascular accident 04/11/2016  . Depression with anxiety 04/11/2016  . Colostomy present (HCC) 04/11/2016  . S/P AKA (above knee amputation) (HCC) 04/11/2016  . Anemia of chronic disease 04/11/2016    Orientation RESPIRATION BLADDER Height & Weight     Self  Normal Incontinent, Indwelling catheter Weight: 104 lb 15 oz (47.6 kg) Height:  5' 6.5" (168.9 cm)  BEHAVIORAL SYMPTOMS/MOOD NEUROLOGICAL BOWEL NUTRITION STATUS      Incontinent Diet (Heart)  PEG tube  AMBULATORY STATUS COMMUNICATION OF NEEDS Skin   Extensive Assist Verbally PU Stage and Appropriate Care (Pressure Injury 07/16/16 Stage III -  Full thickness tissue loss. Subcutaneous fat may be visible but bone, tendon  or muscle are NOT exposed. (mid;lower back))  PressureInjury10/31/17StageII-Partialthicknesslossofdermispresentingasashallowopenulcerwithared,pinkwoundbedwithoutslough. (right posterior leg)  PressureInjury10/31/17Unstageable-Fullthicknesstissuelossinwhichthebaseoftheulceriscoveredbyslough(yellow,tan,gray,greenorbrown)and/oreschar(tan,brownorblack)inthewoundbed.coveredwithwoundvacdressing (sacrum) - Negative Pressure Wound Vac               Personal Care Assistance Level of Assistance  Bathing, Dressing, Feeding Bathing Assistance: Maximum assistance Feeding assistance: Limited assistance Dressing Assistance: Maximum assistance     Functional Limitations Info             SPECIAL CARE FACTORS FREQUENCY                       Contractures      Additional Factors Info  Code Status, Allergies, Isolation Precautions Code Status Info: Fullcode Allergies Info: NKDA     Isolation Precautions Info: ESBL; MRSA     Current Medications (07/19/2016):  This is the current hospital active medication list Current Facility-Administered Medications  Medication Dose Route Frequency Provider Last Rate Last Dose  . 0.9 %  sodium chloride infusion   Intravenous Continuous Eston EstersAhmad Hamad, MD 75 mL/hr at 07/19/16 0002    . aspirin EC tablet 81 mg  81 mg Oral Daily Eston EstersAhmad Hamad, MD   81 mg at 07/18/16 1000  . bisacodyl (DULCOLAX) suppository 10 mg  10 mg Rectal Daily PRN Eston EstersAhmad Hamad, MD      . cefTRIAXone (ROCEPHIN) 1 g in dextrose 5 % 50 mL IVPB  1 g Intravenous Q24H Phylliss Blakesndrea M Lilliston, RPH   1 g at 07/18/16 2242  . Chlorhexidine Gluconate Cloth 2 % PADS 6 each  6 each Topical Q0600 Eston EstersAhmad Hamad, MD  6 each at 07/19/16 0542  . enoxaparin (LOVENOX) injection 40 mg  40 mg Subcutaneous QHS Eston EstersAhmad Hamad, MD   40 mg at 07/18/16 2242  . escitalopram (LEXAPRO) tablet 10 mg  10 mg Per Tube Daily Eston EstersAhmad Hamad, MD   10 mg at 07/18/16 0911  .  feeding supplement (OSMOLITE 1.5 CAL) liquid 1,000 mL  1,000 mL Per Tube Continuous Eston EstersAhmad Hamad, MD 60 mL/hr at 07/19/16 0001 1,000 mL at 07/19/16 0001  . feeding supplement (PRO-STAT SUGAR FREE 64) liquid 30 mL  30 mL Per Tube TID WC Eston EstersAhmad Hamad, MD   30 mL at 07/18/16 1924  . ipratropium-albuterol (DUONEB) 0.5-2.5 (3) MG/3ML nebulizer solution 3 mL  3 mL Nebulization Q6H PRN Eston EstersAhmad Hamad, MD      . LORazepam (ATIVAN) tablet 0.5 mg  0.5 mg Per Tube Q8H PRN Eston EstersAhmad Hamad, MD   0.5 mg at 07/18/16 0912  . magnesium hydroxide (MILK OF MAGNESIA) suspension 30 mL  30 mL Oral Daily PRN Eston EstersAhmad Hamad, MD      . MEDLINE mouth rinse  15 mL Mouth Rinse BID Eston EstersAhmad Hamad, MD   15 mL at 07/18/16 2242  . multivitamin with minerals tablet 1 tablet  1 tablet Oral Daily Eston EstersAhmad Hamad, MD   1 tablet at 07/18/16 1000  . mupirocin ointment (BACTROBAN) 2 % 1 application  1 application Nasal BID Eston EstersAhmad Hamad, MD   1 application at 07/18/16 2242  . oxyCODONE (Oxy IR/ROXICODONE) immediate release tablet 5 mg  5 mg Oral Q6H PRN Eston EstersAhmad Hamad, MD   5 mg at 07/19/16 0542  . pantoprazole sodium (PROTONIX) 40 mg/20 mL oral suspension 40 mg  40 mg Per Tube Daily Eston EstersAhmad Hamad, MD   40 mg at 07/18/16 0911  . pravastatin (PRAVACHOL) tablet 40 mg  40 mg Per Tube q1800 Eston EstersAhmad Hamad, MD   40 mg at 07/18/16 1900  . sodium chloride flush (NS) 0.9 % injection 3 mL  3 mL Intravenous Q12H Eston EstersAhmad Hamad, MD      . Valproate Sodium (DEPAKENE) solution 200 mg  200 mg Oral Q8H Eston EstersAhmad Hamad, MD   200 mg at 07/19/16 0542     Discharge Medications: Please see discharge summary for a list of discharge medications.  Relevant Imaging Results:  Relevant Lab Results:   Additional Information SS # [id number]  Katherine Palmer, Katherine Eastwood F, LCSW

## 2016-07-19 NOTE — Progress Notes (Signed)
Nutrition Follow-up  DOCUMENTATION CODES:   Severe malnutrition in context of acute illness/injury, Underweight  INTERVENTION:  - Will change TF regimen: Osmolite 1.5 @ 50 mL/hr with 30 mL Prostat once/day. This regimen provides  kcal 1900 kcal, 90 grams of protein, and 914 mL free water.  - RD will follow-up 11/6.  NUTRITION DIAGNOSIS:   Malnutrition related to chronic illness as evidenced by severe depletion of body fat, mild depletion of muscle mass, moderate depletions of muscle mass. -ongoing  GOAL:   Patient will meet greater than or equal to 90% of their needs -exceeding with current TF regimen.  MONITOR:   TF tolerance, PO intake, Weight trends, Labs, Skin, I & O's  ASSESSMENT:   55 yo female with history of CVA with left hemiparesis, with left AKA, stage IV decub ulcer with colostomy and PEG tube for feeding due to dysphagia, seizure disorder and HTN. She was brought to the ED for "abnormal Labs" Patient denied any complaints and she does not know why labs were drawn but she has been feeling "down" for the past few days. She was found to be hyperkalemic. She had IVF, Ca gluconate and regular insulin in the ER and hospitalist service was consulted to admit for hyperkalemia.   11/3 No PO intakes documented since previous assessment. Spoke with RN and tech who report pt did not have anything PO this AM. Pt resting at time of RD visit with no family/visitors present; flow sheet indicates pt is alert and oriented to self only.  Currently TF order via PEG: Osmolite 1.5 @ 60 mL/hr with 30 mL Prostat TID. This regimen is providing 2460 kcal (129% max estimated kcal need), 135 grams of protein (142% max estimated protein need), and 1097 mL free water. No new weight since 11/1.  Spoke with Dr. Sharl MaLama on the phone about current TF recommendations and he gives verbal order for RD to adjust TF regimen to better meet pt's estimated nutrition needs; TF regimen adjusted as outlined  above.  Medications reviewed; PRN Dulcolax, daily multivitamin with minerals, 40 mg Protonix/day. Labs reviewed; BUN: 23 mg/dL, creatinine: <1.6<0.3 mg/dL, Ca: 8.4 mg/dL.  IVF: NS @ 75 mL/hr.    11/1 - Pt on Heart Healthy diet and consumed 25% of breakfast this AM.  - Pt reports that she typically drinks more than she eats; unable to obtain clear answer on chewing abilities.  - RN at bedside reports that pt had difficulty with swallowing pills this AM.  - RN reports no issues with TF infusing via PEG.  - Physical assessment shows severe fat wasting and moderate and mild muscle wasting to upper body; lower body not assessed at this time.  - Per chart review, pt has lost 5 lbs (5% body weight) in the past 3 months which is not significant for time frame.  - Currently ordered via PEG: Osmolite 1.5 @ 60 mL/hr with 30 mL Prostat TID.  - This regimen is providing 2460 kcal (129% max estimated kcal need), 135 grams of protein (142% max estimated protein need), and 1097 mL free water.   IVF: NS @ 75 mL/hr.      Diet Order:  Diet Heart Room service appropriate? Yes; Fluid consistency: Thin  Skin:  Wound (see comment) (Stage 4 sacral, stage 3 mid, lower back, and stage 2 R leg pressure injuries)  Last BM:  11/2  Height:   Ht Readings from Last 1 Encounters:  07/17/16 5' 6.5" (1.689 m)    Weight:  Wt Readings from Last 1 Encounters:  07/17/16 104 lb 15 oz (47.6 kg)    Ideal Body Weight:  60.23 kg  BMI:  Body mass index is 16.68 kg/m.  Estimated Nutritional Needs:   Kcal:  1665-1905 (35-40 kcal/kg)  Protein:  81-95 grams (1.7-2 grams/kg)  Fluid:  >/= 1.7 L/day  EDUCATION NEEDS:   No education needs identified at this time    Trenton GammonJessica Erling Arrazola, MS, RD, LDN Inpatient Clinical Dietitian Pager # [phone] After hours/weekend pager # [phone]

## 2016-07-20 DIAGNOSIS — L8944 Pressure ulcer of contiguous site of back, buttock and hip, stage 4: Secondary | ICD-10-CM

## 2016-07-20 LAB — CBC
HCT: 24.2 % — ABNORMAL LOW (ref 36.0–46.0)
Hemoglobin: 7.4 g/dL — ABNORMAL LOW (ref 12.0–15.0)
MCH: 27.8 pg (ref 26.0–34.0)
MCHC: 30.6 g/dL (ref 30.0–36.0)
MCV: 91 fL (ref 78.0–100.0)
PLATELETS: 551 10*3/uL — AB (ref 150–400)
RBC: 2.66 MIL/uL — AB (ref 3.87–5.11)
RDW: 17.9 % — AB (ref 11.5–15.5)
WBC: 11.5 10*3/uL — ABNORMAL HIGH (ref 4.0–10.5)

## 2016-07-20 LAB — OCCULT BLOOD X 1 CARD TO LAB, STOOL: FECAL OCCULT BLD: NEGATIVE

## 2016-07-20 MED ORDER — DEXTROSE 5 % IV SOLN
1.0000 g | Freq: Two times a day (BID) | INTRAVENOUS | Status: DC
  Administered 2016-07-20 – 2016-07-22 (×5): 1 g via INTRAVENOUS
  Filled 2016-07-20 (×5): qty 1

## 2016-07-20 MED ORDER — LEVOFLOXACIN 500 MG PO TABS: 500.0000 mg | ORAL_TABLET | Freq: Once | ORAL | Status: DC

## 2016-07-20 MED ORDER — HYDRALAZINE HCL 20 MG/ML IJ SOLN: 10.0000 mg | Freq: Four times a day (QID) | INTRAMUSCULAR | Status: DC | PRN

## 2016-07-20 NOTE — Progress Notes (Signed)
[facility]  PROGRESS NOTE  Katherine AhmadiDiane Imler ZOX:[id number] DOB: 18-Jul-1961 DOA: 07/16/2016 PCP: Kirt Boysarter, Monica, DO   Brief HPI:   55 yo female with history of CVA with left hemiparesis, with left AKA, stage IV decub ulcer with colostomy and PEG tube for feeding due to dysphagia, seizure disorder and HTN. She was brought to the ED for "abnormal Labs" Patient denied any complaints and she does not know why labs were drawn but she has been feeling "down" for the past few days. She was found to be hyperkalemic. She had IVF, Ca gluconate and regular insulin in the ER and hospitalist service was consulted to admit for hyperkalemia   Subjective   Patient seen and examined, continues to be intermittently confused. Urine culture has been obtained.   Assessment/Plan:     1. Hyperkalemia- resolved, today  potassium is 4.9 2. Altered mental status-  ? at baseline,  mild confusion, likely from UTI. Called and discussed with son, as per son she gets episodes of intermittent confusion. 3. Anemia- no bleeding history, likely due to chronic disease. Status post 1 unit PRBC. Today hemoglobin is 7.4. Check stool for occult blood. 4. UTI- had abnormal UA on admission, started on ceftriaxone. Urine culture is growing 40,000 colonies of Pseudomonas, final result is pending. Culture was obtained after starting ceftriaxone.Will start Cefepime. 5. History of seizure- continue Depakote 6. Hypertension- home medications on hold. 7. 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   DVT prophylaxis: Lovenox  Code Status: Full code  Family Communication: discussed with patients son on phone  Disposition Plan: SNF   Consultants:  None   Procedures:  None   Antibiotics:   Anti-infectives    Start     Dose/Rate Route Frequency Ordered Stop   07/16/16 2300  vancomycin (VANCOCIN) IVPB 750 mg/150 ml premix  Status:  Discontinued     750 mg 150 mL/hr  over 60 Minutes Intravenous Every 24 hours 07/16/16 2202 07/18/16 1248   07/16/16 2230  cefTRIAXone (ROCEPHIN) 1 g in dextrose 5 % 50 mL IVPB     1 g 100 mL/hr over 30 Minutes Intravenous Every 24 hours 07/16/16 2202         Objective   Vitals:   07/19/16 0658 07/19/16 1300 07/19/16 2135 07/20/16 0534  BP: 129/79 (!) 133/48 93/64 134/82  Pulse: 100 (!) 105 (!) 105 (!) 103  Resp: 17 18 20 20   Temp: 98.4 F (36.9 C) 99.6 F (37.6 C) 98.2 F (36.8 C) 98.5 F (36.9 C)  TempSrc: Oral Axillary Oral Oral  SpO2: 100% 100% 99% 99%  Weight:      Height:        Intake/Output Summary (Last 24 hours) at 07/20/16 1257 Last data filed at 07/20/16 1036  Gross per 24 hour  Intake             3690 ml  Output              720 ml  Net             2970 ml   Filed Weights   07/17/16 0012  Weight: 47.6 kg (104 lb 15 oz)     Physical Examination:  General exam: Appears calm and comfortable. Respiratory system: Clear to auscultation. Respiratory effort normal. Cardiovascular system:  RRR. No  murmurs, rubs, gallops. No pedal edema. GI system: Abdomen is nondistended, soft, non tender to palpation. Psychiatry: Alert, oriented x 2. Pleasantly  confused.    Data Reviewed: I have personally reviewed following labs and imaging studies  CBG: No results for input(s): GLUCAP in the last 168 hours.  CBC:  Recent Labs Lab 07/16/16 07/16/16 1822 07/16/16 2017 07/17/16 0830 07/19/16 0443 07/20/16 0354  WBC 16.5  --  11.8* 13.7* 13.8* 11.5*  NEUTROABS  --   --  7.5  --   --   --   HGB 7.7* 8.8* 6.6* 8.3* 7.9* 7.4*  HCT 26* 26.0* 21.7* 26.7* 26.1* 24.2*  MCV  --   --  91.6 89.3 90.6 91.0  PLT 601*  --  567* 580* 531* 551*    Basic Metabolic Panel:  Recent Labs Lab 07/16/16 07/16/16 1822 07/16/16 2017 07/17/16 0830 07/19/16 0443  NA 133* 128* 133* 135 137  K 6.9* 7.2* 5.2* 5.1 4.9  CL  --  98* 102 102 107  CO2  --   --  26 23 24   GLUCOSE  --  87 156* 137* 130*  BUN 62* 85*  61* 46* 23*  CREATININE 0.4* 0.70 0.48 0.40* <0.30*  CALCIUM  --   --  11.0* 8.4* 8.4*    Recent Results (from the past 240 hour(s))  Culture, blood (routine x 2)     Status: None (Preliminary result)   Collection Time: 07/16/16 10:00 PM  Result Value Ref Range Status   Specimen Description BLOOD RIGHT WRIST  Final   Special Requests BOTTLES DRAWN AEROBIC AND ANAEROBIC 5CC  Final   Culture   Final    NO GROWTH 3 DAYS Performed at [facility]    Report Status PENDING  Incomplete  Culture, blood (routine x 2)     Status: None (Preliminary result)   Collection Time: 07/16/16 10:07 PM  Result Value Ref Range Status   Specimen Description BLOOD BLOOD LEFT FOREARM  Final   Special Requests IN PEDIATRIC BOTTLE 2CC  Final   Culture   Final    NO GROWTH 3 DAYS Performed at [facility]    Report Status PENDING  Incomplete  MRSA PCR Screening     Status: Abnormal   Collection Time: 07/17/16  1:01 AM  Result Value Ref Range Status   MRSA by PCR POSITIVE (A) NEGATIVE Final    Comment:        The GeneXpert MRSA Assay (FDA approved for NASAL specimens only), is one component of a comprehensive MRSA colonization surveillance program. It is not intended to diagnose MRSA infection nor to guide or monitor treatment for MRSA infections. RESULT CALLED TO, READ BACK BY AND VERIFIED WITH: Burt EkL VERGELVION RN [id number] 07/17/16 A NAVARRO   Culture, Urine     Status: Abnormal (Preliminary result)   Collection Time: 07/19/16 10:27 AM  Result Value Ref Range Status   Specimen Description URINE, RANDOM  Final   Special Requests NONE  Final   Culture 40,000 COLONIES/mL PSEUDOMONAS AERUGINOSA (A)  Final   Report Status PENDING  Incomplete     Liver Function Tests:  Recent Labs Lab 07/16/16 07/16/16 2017 07/17/16 0830  AST 12* 25 20  ALT 11 11* 13*  ALKPHOS 127* 90 95  BILITOT  --  0.7 0.7  PROT  --  6.0* 6.5  ALBUMIN  --  1.5* 1.6*      Studies: No results  found.  Scheduled Meds: . aspirin EC  81 mg Oral Daily  . cefTRIAXone (ROCEPHIN)  IV  1 g Intravenous Q24H  . Chlorhexidine Gluconate Cloth  6  each Topical [id number]  . enoxaparin (LOVENOX) injection  40 mg Subcutaneous QHS  . escitalopram  10 mg Per Tube Daily  . feeding supplement (PRO-STAT SUGAR FREE 64)  30 mL Per Tube Daily  . mouth rinse  15 mL Mouth Rinse BID  . multivitamin with minerals  1 tablet Oral Daily  . mupirocin ointment  1 application Nasal BID  . pantoprazole sodium  40 mg Per Tube Daily  . pravastatin  40 mg Per Tube q1800  . sodium chloride flush  3 mL Intravenous Q12H  . Valproate Sodium  200 mg Oral Q8H    Continuous Infusions: . sodium chloride 75 mL/hr at 07/20/16 0159  . feeding supplement (OSMOLITE 1.5 CAL) 1,000 mL (07/20/16 0159)    Time spent: 25 min  [facility] S   [facility] Pager [phone]. If 7PM-7AM, please contact night-coverage at [email or web address], Office  [phone]  password [email or web address] 07/20/2016, 12:57 PM  LOS: 3 days

## 2016-07-20 NOTE — Clinical Social Work Note (Signed)
CSW spoke with Dr. Sharl MaLama- states that patient is not medically stable for d/c today. He will re-evaluate in the a.m.  Notified Reyne Dumasammy Blakely, Pension scheme managerN Liason for [facility] of same.  CSW services will continue to monitor for d/c date to [facility].  Lorri Frederickonna T. Jaci LazierCrowder, [place]CSW [phone]  (weekend coverage)

## 2016-07-20 NOTE — Progress Notes (Signed)
Pharmacy Antibiotic Note  Katherine Palmer is a 55 y.o. female with extensive PMH known to pharmacy from abx consults with this admission.  Urine culture now with 40K pseudomonas.  To change ceftriaxone to cefepime for UTI.   Plan: - cefepime 1 gm IV q12h - f/u cx  _______________________________________  Height: 5' 6.5" (168.9 cm) Weight: 104 lb 15 oz (47.6 kg) IBW/kg (Calculated) : 60.45  Temp (24hrs), Avg:98.4 F (36.9 C), Min:98.2 F (36.8 C), Max:98.5 F (36.9 C)   Recent Labs Lab 07/16/16 07/16/16 1822 07/16/16 2017 07/17/16 0830 07/19/16 0443 07/20/16 0354  WBC 16.5  --  11.8* 13.7* 13.8* 11.5*  CREATININE 0.4* 0.70 0.48 0.40* <0.30*  --   LATICACIDVEN  --  1.84  --   --   --   --     CrCl cannot be calculated (This lab value cannot be used to calculate CrCl because it is not a number: <0.30).    No Known Allergies  Antimicrobials this admission: Vanc 10/31  >> 11/2 Rocephin 10/31 >> 11/4 Cefepime 11/4>>   Dose adjustments this admission: n/a   Microbiology results: 10/31 BCx x2: sent 11/1 MRSA PCR (+) 11/3  ucx: 40K pseudomonas   Thank you for allowing pharmacy to be a part of this patient's care.  Dorna Leitzham, Ronith Berti P 07/20/2016 1:30 PM

## 2016-07-21 LAB — CBC
HEMATOCRIT: 25 % — AB (ref 36.0–46.0)
HEMOGLOBIN: 7.7 g/dL — AB (ref 12.0–15.0)
MCH: 28.2 pg (ref 26.0–34.0)
MCHC: 30.8 g/dL (ref 30.0–36.0)
MCV: 91.6 fL (ref 78.0–100.0)
Platelets: 558 10*3/uL — ABNORMAL HIGH (ref 150–400)
RBC: 2.73 MIL/uL — AB (ref 3.87–5.11)
RDW: 17.6 % — ABNORMAL HIGH (ref 11.5–15.5)
WBC: 12.2 10*3/uL — ABNORMAL HIGH (ref 4.0–10.5)

## 2016-07-21 LAB — URINE CULTURE

## 2016-07-21 LAB — COMPREHENSIVE METABOLIC PANEL
ALBUMIN: 1.3 g/dL — AB (ref 3.5–5.0)
ALK PHOS: 82 U/L (ref 38–126)
ALT: 11 U/L — AB (ref 14–54)
ANION GAP: 7 (ref 5–15)
AST: 13 U/L — AB (ref 15–41)
BILIRUBIN TOTAL: 0.4 mg/dL (ref 0.3–1.2)
BUN: 21 mg/dL — AB (ref 6–20)
CALCIUM: 8.1 mg/dL — AB (ref 8.9–10.3)
CO2: 23 mmol/L (ref 22–32)
Chloride: 106 mmol/L (ref 101–111)
GLUCOSE: 121 mg/dL — AB (ref 65–99)
Potassium: 4.8 mmol/L (ref 3.5–5.1)
Sodium: 136 mmol/L (ref 135–145)
TOTAL PROTEIN: 6 g/dL — AB (ref 6.5–8.1)

## 2016-07-21 LAB — OCCULT BLOOD X 1 CARD TO LAB, STOOL: FECAL OCCULT BLD: NEGATIVE

## 2016-07-21 LAB — MAGNESIUM: Magnesium: 1.5 mg/dL — ABNORMAL LOW (ref 1.7–2.4)

## 2016-07-21 MED ORDER — SODIUM CHLORIDE 0.9% FLUSH
10.0000 mL | INTRAVENOUS | Status: DC | PRN
  Administered 2016-07-21 – 2016-07-22 (×2): 10 mL
  Filled 2016-07-21 (×2): qty 40

## 2016-07-21 MED ORDER — METOPROLOL TARTRATE 25 MG PO TABS
25.0000 mg | ORAL_TABLET | Freq: Two times a day (BID) | ORAL | Status: DC
  Administered 2016-07-21 – 2016-07-22 (×2): 25 mg via ORAL
  Filled 2016-07-21 (×2): qty 1

## 2016-07-21 NOTE — Progress Notes (Signed)
[facility]  PROGRESS NOTE  Katherine Palmer ZOX:[id number] DOB: Apr 04, 1961 DOA: 07/16/2016 PCP: Kirt Boys, DO   Brief HPI:   55 yo female with history of CVA with left hemiparesis, with left AKA, stage IV decub ulcer with colostomy and PEG tube for feeding due to dysphagia, seizure disorder and HTN. She was brought to the ED for "abnormal Labs" Patient denied any complaints and she does not know why labs were drawn but she has been feeling "down" for the past few days. She was found to be hyperkalemic. She had IVF, Ca gluconate and regular insulin in the ER and hospitalist service was consulted to admit for hyperkalemia   Subjective   Patient seen and examined,She is more alert and oriented today. Knows that she is at [facility], was able to tell me that current month is november.   Assessment/Plan:     1. Hyperkalemia- resolved, today  potassium is 4.9 2. Altered mental status-  ? at baseline,  mild confusion, likely from UTI. Called and discussed with son, as per son she gets episodes of intermittent confusion. 3. Anemia- no bleeding history, likely due to chronic disease. Status post 1 unit PRBC. Today hemoglobin is 7.7 has improved from 7.4. Stool for occult blood is negative. 4. UTI- had abnormal UA on admission, started on ceftriaxone. Urine culture is growing 40,000 colonies of Pseudomonas, final result Shows Pseudomonas sensitive to cefepime Culture was obtained after starting ceftriaxone.Continue cefepime. Will obtain PICC line, and discharge patient home on cefepime for 7 days 5. History of seizure- continue Depakote 6. Hypertension- home medications on hold. 7. 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   DVT prophylaxis: Lovenox  Code Status: Full code  Family Communication: discussed with patients son on phone  Disposition Plan: SNF   Consultants:  None   Procedures:  None   Antibiotics:    Anti-infectives    Start     Dose/Rate Route Frequency Ordered Stop   07/20/16 1530  levofloxacin (LEVAQUIN) tablet 500 mg  Status:  Discontinued     500 mg Oral  Once 07/20/16 1524 07/20/16 1638   07/20/16 1400  ceFEPIme (MAXIPIME) 1 g in dextrose 5 % 50 mL IVPB     1 g 100 mL/hr over 30 Minutes Intravenous Every 12 hours 07/20/16 1335     07/16/16 2300  vancomycin (VANCOCIN) IVPB 750 mg/150 ml premix  Status:  Discontinued     750 mg 150 mL/hr over 60 Minutes Intravenous Every 24 hours 07/16/16 2202 07/18/16 1248   07/16/16 2230  cefTRIAXone (ROCEPHIN) 1 g in dextrose 5 % 50 mL IVPB  Status:  Discontinued     1 g 100 mL/hr over 30 Minutes Intravenous Every 24 hours 07/16/16 2202 07/20/16 1302       Objective   Vitals:   07/20/16 1409 07/20/16 2217 07/20/16 2243 07/21/16 0534  BP: (!) 140/98 (!) 148/97 (!) 151/80 (!) 149/73  Pulse: (!) 102 100 (!) 107 (!) 101  Resp: 20 20 20 20   Temp: 98.8 F (37.1 C) 99 F (37.2 C)  98 F (36.7 C)  TempSrc: Oral Oral  Oral  SpO2: 97% 100% 100% 98%  Weight:      Height:        Intake/Output Summary (Last 24 hours) at 07/21/16 1210 Last data filed at 07/21/16 1025  Gross per 24 hour  Intake  3590 ml  Output             1250 ml  Net             2340 ml   Filed Weights   07/17/16 0012  Weight: 47.6 kg (104 lb 15 oz)     Physical Examination:  General exam: Appears calm and comfortable. Respiratory system: Clear to auscultation. Respiratory effort normal. Cardiovascular system:  RRR. No  murmurs, rubs, gallops. No pedal edema. GI system: Abdomen is nondistended, soft, non tender to palpation. Psychiatry: Alert, oriented x 2. Pleasantly confused.    Data Reviewed: I have personally reviewed following labs and imaging studies  CBG: No results for input(s): GLUCAP in the last 168 hours.  CBC:  Recent Labs Lab 07/16/16 2017 07/17/16 0830 07/19/16 0443 07/20/16 0354 07/21/16 0547  WBC 11.8* 13.7* 13.8* 11.5*  12.2*  NEUTROABS 7.5  --   --   --   --   HGB 6.6* 8.3* 7.9* 7.4* 7.7*  HCT 21.7* 26.7* 26.1* 24.2* 25.0*  MCV 91.6 89.3 90.6 91.0 91.6  PLT 567* 580* 531* 551* 558*    Basic Metabolic Panel:  Recent Labs Lab 07/16/16 1822 07/16/16 2017 07/17/16 0830 07/19/16 0443 07/21/16 0547  NA 128* 133* 135 137 136  K 7.2* 5.2* 5.1 4.9 4.8  CL 98* 102 102 107 106  CO2  --  26 23 24 23   GLUCOSE 87 156* 137* 130* 121*  BUN 85* 61* 46* 23* 21*  CREATININE 0.70 0.48 0.40* <0.30* <0.30*  CALCIUM  --  11.0* 8.4* 8.4* 8.1*    Recent Results (from the past 240 hour(s))  Culture, blood (routine x 2)     Status: None (Preliminary result)   Collection Time: 07/16/16 10:00 PM  Result Value Ref Range Status   Specimen Description BLOOD RIGHT WRIST  Final   Special Requests BOTTLES DRAWN AEROBIC AND ANAEROBIC 5CC  Final   Culture   Final    NO GROWTH 4 DAYS Performed at [facility]    Report Status PENDING  Incomplete  Culture, blood (routine x 2)     Status: None (Preliminary result)   Collection Time: 07/16/16 10:07 PM  Result Value Ref Range Status   Specimen Description BLOOD BLOOD LEFT FOREARM  Final   Special Requests IN PEDIATRIC BOTTLE 2CC  Final   Culture   Final    NO GROWTH 4 DAYS [facility]formed at [facility]    Report Status PENDING  Incomplete  MRSA PCR Screening     Status: Abnormal   Collection Time: 07/17/16  1:01 AM  Result Value Ref Range Status   MRSA by PCR POSITIVE (A) NEGATIVE Final    Comment:        The GeneXpert MRSA Assay (FDA approved for NASAL specimens only), is one component of a comprehensive MRSA colonization surveillance program. It is not intended to diagnose MRSA infection nor to guide or monitor treatment for MRSA infections. RESULT CALLED TO, READ BACK BY AND VERIFIED WITH: Burt EkL VERGELVION RN 0501 07/17/16 A NAVARRO   Culture, Urine     Status: Abnormal   Collection Time: 07/19/16 10:27 AM  Result Value Ref Range Status    Specimen Description URINE, RANDOM  Final   Special Requests NONE  Final   Culture 40,000 COLONIES/mL PSEUDOMONAS AERUGINOSA (A)  Final   Report Status 07/21/2016 FINAL  Final   Organism ID, Bacteria PSEUDOMONAS AERUGINOSA (A)  Final      Susceptibility  Pseudomonas aeruginosa - MIC*    CEFTAZIDIME 4 SENSITIVE Sensitive     CIPROFLOXACIN 2 INTERMEDIATE Intermediate     GENTAMICIN <=1 SENSITIVE Sensitive     IMIPENEM >=16 RESISTANT Resistant     PIP/TAZO 32 SENSITIVE Sensitive     CEFEPIME 4 SENSITIVE Sensitive     * 40,000 COLONIES/mL PSEUDOMONAS AERUGINOSA     Liver Function Tests:  Recent Labs Lab 07/16/16 07/16/16 2017 07/17/16 0830 07/21/16 0547  AST 12* 25 20 13*  ALT 11 11* 13* 11*  ALKPHOS 127* 90 95 82  BILITOT  --  0.7 0.7 0.4  PROT  --  6.0* 6.5 6.0*  ALBUMIN  --  1.5* 1.6* 1.3*      Studies: No results found.  Scheduled Meds: . aspirin EC  81 mg Oral Daily  . ceFEPime (MAXIPIME) IV  1 g Intravenous Q12H  . enoxaparin (LOVENOX) injection  40 mg Subcutaneous QHS  . escitalopram  10 mg Per Tube Daily  . feeding supplement (PRO-STAT SUGAR FREE 64)  30 mL Per Tube Daily  . mouth rinse  15 mL Mouth Rinse BID  . multivitamin with minerals  1 tablet Oral Daily  . mupirocin ointment  1 application Nasal BID  . pantoprazole sodium  40 mg Per Tube Daily  . pravastatin  40 mg Per Tube q1800  . sodium chloride flush  3 mL Intravenous Q12H  . Valproate Sodium  200 mg Oral Q8H    Continuous Infusions: . sodium chloride 75 mL/hr at 07/21/16 0842  . feeding supplement (OSMOLITE 1.5 CAL) 1,000 mL (07/20/16 2243)    Time spent: 25 min  [facility],Jashun Puertas S   [facility] Pager [phone]. If 7PM-7AM, please contact night-coverage at [email or web address], Office  [phone]  password [email or web address] 07/21/2016, 12:10 PM  LOS: 4 days

## 2016-07-21 NOTE — Progress Notes (Signed)
Peripherally Inserted Central Catheter/Midline Placement  The IV Nurse has discussed with the patient and/or persons authorized to consent for the patient, the purpose of this procedure and the potential benefits and risks involved with this procedure.  The benefits include less needle sticks, lab draws from the catheter, and the patient may be discharged home with the catheter. Risks include, but not limited to, infection, bleeding, blood clot (thrombus formation), and puncture of an artery; nerve damage and irregular heartbeat and possibility to perform a PICC exchange if needed/ordered by physician.  Alternatives to this procedure were also discussed.  B[employer] Power PICC patient education guide, fact sheet on infection prevention and patient information card has been provided to patient /or left at bedside.    PICC/Midline Placement Documentation    Information given to Daughter Thomasena EdisKaticia  Rhyne   Dallys Nowakowski [facility] 07/21/2016, 2:25 PM

## 2016-07-21 NOTE — Progress Notes (Signed)
Telemetry showed 7 beats of v tach.  Pt resting without complaints.  Confirmed tele strip.  MD notified, will continue to monitor.

## 2016-07-22 LAB — OCCULT BLOOD X 1 CARD TO LAB, STOOL: Fecal Occult Bld: NEGATIVE

## 2016-07-22 LAB — CULTURE, BLOOD (ROUTINE X 2)
Culture: NO GROWTH
Culture: NO GROWTH

## 2016-07-22 MED ORDER — HEPARIN SOD (PORK) LOCK FLUSH 100 UNIT/ML IV SOLN
250.0000 [IU] | INTRAVENOUS | Status: AC | PRN
  Administered 2016-07-22: 250 [IU]

## 2016-07-22 MED ORDER — METOPROLOL TARTRATE 25 MG PO TABS: 25.0000 mg | ORAL_TABLET | Freq: Two times a day (BID) | ORAL | Status: AC

## 2016-07-22 MED ORDER — OXYCODONE HCL 5 MG PO TABA: 5.0000 mg | ORAL_TABLET | Freq: Four times a day (QID) | ORAL | 0 refills | Status: DC | PRN

## 2016-07-22 MED ORDER — DEXTROSE 5 % IV SOLN: 1.0000 g | Freq: Two times a day (BID) | INTRAVENOUS | Status: AC

## 2016-07-22 MED ORDER — MAGNESIUM SULFATE 2 GM/50ML IV SOLN
2.0000 g | Freq: Once | INTRAVENOUS | Status: AC
  Administered 2016-07-22: 2 g via INTRAVENOUS
  Filled 2016-07-22: qty 50

## 2016-07-22 MED ORDER — LORAZEPAM 0.5 MG PO TABS: 0.5000 mg | ORAL_TABLET | Freq: Three times a day (TID) | ORAL | 1 refills | Status: DC | PRN

## 2016-07-22 MED ORDER — MAGNESIUM SULFATE BOLUS VIA INFUSION
2.0000 g | Freq: Once | INTRAVENOUS | Status: DC
  Filled 2016-07-22 (×2): qty 500

## 2016-07-22 NOTE — Discharge Summary (Addendum)
Physician Discharge Summary  Katherine AhmadiDiane Palmer YNW:[id number] DOB: Sep 18, 1960 DOA: 07/16/2016  PCP: Katherine Boysarter, Monica, DO  Admit date: 07/16/2016 Discharge date: 07/22/2016  Time spent: 25 minutes  Recommendations for Outpatient Follow-up:  1. Patient will need 7 more days of IV cefepime, stop date on 07/29/16   Discharge Diagnoses:  Active Problems:   Acute hyperkalemia   Pressure injury of skin   UTI (urinary tract infection)   Discharge Condition: Stable  Diet recommendation: heart healthy diet  Filed Weights   07/17/16 0012  Weight: 47.6 kg (104 lb 15 oz)    History of present illness:  55 yo female with history of CVA with left hemiparesis, with left AKA, stage IV decub ulcer with colostomy and PEG tube for feeding due to dysphagia, seizure disorder and HTN. She was brought to the ED for "abnormal Labs" Patient denied any complaints and she does not know why labs were drawn but she has been feeling "down" for the past few days. She was found to be hyperkalemic. She had IVF, Ca gluconate and regular insulin in the ER and hospitalist service was consulted to admit for hyperkalemia   Hospital Course:  1. Hyperkalemia- resolved, potassium is 4.8 2. Altered mental status-   improved,   mild confusion, likely from UTI. Called and discussed with son, as per son she gets episodes of intermittent confusion. 3. Anemia- no bleeding history, likely due to chronic disease. Status post 1 unit PRBC. Today hemoglobin is 7.7 has improved from 7.4. Stool for occult blood is negative. 4. UTI- had abnormal UA on admission, started on ceftriaxone. Urine culture is growing 40,000 colonies of Pseudomonas, final result Shows Pseudomonas sensitive to cefepime Culture was obtained after starting ceftriaxone.Continue cefepime. We obtained PICC line, and discharge patient home on cefepime for 7 days. STOP DATE 07/29/16 5. History of seizure- continue Depakote 6. Hypertension- started on Metoprolol 25 mg po  bid 7. 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 8. Hypomagnesemia- will replace magnesium with mag sulfate 2 gm IV x 1 before discharge. 9. NSVT- had 7 beat run of Vtach , we started metoprolol. Will replace magnesium before discharge.  Procedures:  None   Consultations:    Discharge Exam: Vitals:   07/21/16 2256 07/22/16 0438  BP: 138/74 131/72  Pulse: (!) 101 98  Resp:  18  Temp:  98.9 F (37.2 C)    General: Appears in no acute distress Cardiovascular: RRR, no murmur Respiratory: Clear bilaterally    Medication List    STOP taking these medications   ipratropium 0.02 % nebulizer solution Commonly known as:  ATROVENT     TAKE these medications   acetaminophen 325 MG tablet Commonly known as:  TYLENOL Place 650 mg into feeding tube every 4 (four) hours as needed for mild pain.   albuterol (2.5 MG/3ML) 0.083% nebulizer solution Commonly known as:  PROVENTIL Take 2.5 mg by nebulization every 4 (four) hours as needed for wheezing or shortness of breath.   ascorbic acid 500 MG tablet Commonly known as:  VITAMIN C Take 500 mg by mouth daily.   aspirin EC 81 MG tablet Take 81 mg by mouth daily. Via G-Tube   bisacodyl 10 MG suppository Commonly known as:  DULCOLAX Place 10 mg rectally daily as needed for moderate constipation.   ceFEPIme 1 g in dextrose 5 % 50 mL Inject 1 g into the vein every 12 (twelve) hours.   cloNIDine 0.1 MG tablet Commonly  known as:  CATAPRES Take 0.1 mg by mouth every 6 (six) hours as needed (blood pressure).   DECUBI-VITE Caps Take 1 capsule by mouth daily.   DEPAKENE PO Give 200 mg by tube every 8 (eight) hours.   escitalopram 5 MG tablet Commonly known as:  LEXAPRO Place 10 mg into feeding tube daily.   feeding supplement (OSMOLITE 1.5 CAL) Liqd Place 1,000 mLs into feeding tube continuous.   feeding supplement (PRO-STAT SUGAR FREE 64) Liqd Place 30 mLs into feeding  tube 3 (three) times daily with meals.   FOLBECAL 1 MG Tabs Give 1 tablet by tube daily.   ipratropium-albuterol 0.5-2.5 (3) MG/3ML Soln Commonly known as:  DUONEB Take 3 mLs by nebulization every 6 (six) hours as needed (sob).   LORazepam 0.5 MG tablet Commonly known as:  ATIVAN Place 1 tablet (0.5 mg total) into feeding tube every 8 (eight) hours as needed for anxiety.   magnesium hydroxide 400 MG/5ML suspension Commonly known as:  MILK OF MAGNESIA Take 30 mLs by mouth daily as needed for mild constipation.   metoprolol tartrate 25 MG tablet Commonly known as:  LOPRESSOR Take 1 tablet (25 mg total) by mouth 2 (two) times daily.   OxyCODONE HCl (Abuse Deter) 5 MG Taba Commonly known as:  OXAYDO Take 5 mg by mouth every 6 (six) hours as needed (pain).   polyethylene glycol packet Commonly known as:  MIRALAX / GLYCOLAX Place 17 g into feeding tube daily.   pravastatin 40 MG tablet Commonly known as:  PRAVACHOL Place 40 mg into feeding tube daily.   PROTONIX 40 mg/20 mL Pack Generic drug:  pantoprazole sodium Place 40 mg into feeding tube daily.   Zinc 100 MG Tabs Take 200 mg by mouth daily.      Discharge Instructions   Discharge Instructions    Diet - low sodium heart healthy    Complete by:  As directed    Increase activity slowly    Complete by:  As directed       No Known Allergies    The results of significant diagnostics from this hospitalization (including imaging, microbiology, ancillary and laboratory) are listed below for reference.    Significant Diagnostic Studies: No results found.  Microbiology: Recent Results (from the past 240 hour(s))  Culture, blood (routine x 2)     Status: None (Preliminary result)   Collection Time: 07/16/16 10:00 PM  Result Value Ref Range Status   Specimen Description BLOOD RIGHT WRIST  Final   Special Requests BOTTLES DRAWN AEROBIC AND ANAEROBIC 5CC  Final   Culture   Final    NO GROWTH 4 DAYS [facility]    Report Status PENDING  Incomplete  Culture, blood (routine x 2)     Status: None (Preliminary result)   Collection Time: 07/16/16 10:07 PM  Result Value Ref Range Status   Specimen Description BLOOD BLOOD LEFT FOREARM  Final   Special Requests IN PEDIATRIC BOTTLE 2CC  Final   Culture   Final    NO GROWTH 4 DAYS Performed at [facility]    Report Status PENDING  Incomplete  MRSA PCR Screening     Status: Abnormal   Collection Time: 07/17/16  1:01 AM  Result Value Ref Range Status   MRSA by PCR POSITIVE (A) NEGATIVE Final    Comment:        The GeneXpert MRSA Assay (FDA approved for NASAL specimens only), is one component of a  comprehensive MRSA colonization surveillance program. It is not intended to diagnose MRSA infection nor to guide or monitor treatment for MRSA infections. RESULT CALLED TO, READ BACK BY AND VERIFIED WITH: L VERGELVION RN 0501 07/17/16 A NAVARRO   Culture, Urine     Status: Abnormal   Collection Time: 07/19/16 10:27 AM  Result Value Ref Range Status   Specimen Description URINE, RANDOM  Final   Special Requests NONE  Final   Culture 40,000 COLONIES/mL PSEUDOMONAS AERUGINOSA (A)  Final   Report Status 07/21/2016 FINAL  Final   Organism ID, Bacteria PSEUDOMONAS AERUGINOSA (A)  Final      Susceptibility   Pseudomonas aeruginosa - MIC*    CEFTAZIDIME 4 SENSITIVE Sensitive     CIPROFLOXACIN 2 INTERMEDIATE Intermediate     GENTAMICIN <=1 SENSITIVE Sensitive     IMIPENEM >=16 RESISTANT Resistant     PIP/TAZO 32 SENSITIVE Sensitive     CEFEPIME 4 SENSITIVE Sensitive     * 40,000 COLONIES/mL PSEUDOMONAS AERUGINOSA     Labs: Basic Metabolic Panel:  Recent Labs Lab 07/16/16 1822 07/16/16 2017 07/17/16 0830 07/19/16 0443 07/21/16 0547 07/21/16 1800  NA 128* 133* 135 137 136  --   K 7.2* 5.2* 5.1 4.9 4.8  --   CL 98* 102 102 107 106  --   CO2  --  26 23 24 23   --   GLUCOSE 87 156* 137* 130* 121*  --   BUN 85* 61*  46* 23* 21*  --   CREATININE 0.70 0.48 0.40* <0.30* <0.30*  --   CALCIUM  --  11.0* 8.4* 8.4* 8.1*  --   MG  --   --   --   --   --  1.5*   Liver Function Tests:  Recent Labs Lab 07/16/16 07/16/16 2017 07/17/16 0830 07/21/16 0547  AST 12* 25 20 13*  ALT 11 11* 13* 11*  ALKPHOS 127* 90 95 82  BILITOT  --  0.7 0.7 0.4  PROT  --  6.0* 6.5 6.0*  ALBUMIN  --  1.5* 1.6* 1.3*   No results for input(s): LIPASE, AMYLASE in the last 168 hours. No results for input(s): AMMONIA in the last 168 hours. CBC:  Recent Labs Lab 07/16/16 2017 07/17/16 0830 07/19/16 0443 07/20/16 0354 07/21/16 0547  WBC 11.8* 13.7* 13.8* 11.5* 12.2*  NEUTROABS 7.5  --   --   --   --   HGB 6.6* 8.3* 7.9* 7.4* 7.7*  HCT 21.7* 26.7* 26.1* 24.2* 25.0*  MCV 91.6 89.3 90.6 91.0 91.6  PLT 567* 580* 531* 551* 558*       Signed:  Omarr Hann S MD.  [facility] 07/22/2016, 9:39 AM

## 2016-07-22 NOTE — Progress Notes (Signed)
REport called [name], [facility]. All questions answered, contact number provided. Pt transferred to SNF at this time by [facility] in stable condition

## 2016-07-22 NOTE — Progress Notes (Signed)
[facility] is [facility]. Patient & son, Fayrene FearingJames made aware. Discharge packet given to RN, Vinnie LangtonGretchen. [facility] called for transport to pickup at 2:30pm.    Lincoln MaxinKelly Eldridge Marcott, LCSW [facility] Clinical Social Worker cell #: [phone]

## 2016-07-22 NOTE — Progress Notes (Signed)
Nutrition Follow-up  DOCUMENTATION CODES:   Severe malnutrition in context of acute illness/injury, Underweight  INTERVENTION:  - Continue Osmolite 1.5 @ 50 mL/hr with 30 mL Prostat once/day. - RD will follow-up for additional nutrition-related needs if pt unable to d/c today.   NUTRITION DIAGNOSIS:   Malnutrition related to chronic illness as evidenced by severe depletion of body fat, mild depletion of muscle mass, moderate depletions of muscle mass. -ongoing  GOAL:   Patient will meet greater than or equal to 90% of their needs -met with TF regimen alone.  MONITOR:   TF tolerance, PO intake, Weight trends, Labs, Skin, I & O's  ASSESSMENT:   55 yo female with history of CVA with left hemiparesis, with left AKA, stage IV decub ulcer with colostomy and PEG tube for feeding due to dysphagia, seizure disorder and HTN. She was brought to the ED for "abnormal Labs" Patient denied any complaints and she does not know why labs were drawn but she has been feeling "down" for the past few days. She was found to be hyperkalemic. She had IVF, Ca gluconate and regular insulin in the ER and hospitalist service was consulted to admit for hyperkalemia.   11/6 Pt continues with confusion. Per chart, pt consumed 50% of lunch and 25% of dinner yesterday and 25% of breakfast this AM. Pt continues with Osmolite 1.5 @ 50 mL/hr with 30 mL Prostat once/day. This regimen is providing 1900 kcal, 90 grams of protein, and 914 mL free water. No new weight since 07/17/16. Pt is to d/c back to [facility] SN[facility] today.   Medications reviewed; PRN Dulcolax, 2 mg IV Mg sulfate x1 dose today, daily multivitamin with minerals, 40 mg Protonix/day. Labs reviewed; BUN: 21 mg/dL, creatinine: <0.3 mg/dL, Ca: 8.1 mg/dL, Mg: 1.5 mg/dL.    IVF: NS @ 75 mL/hr.    11/3 - No PO intakes documented since previous assessment.  - Spoke with RN and tech who report pt did not have anything PO this AM.  - Pt resting at time of RD  visit with no family/visitors present; flow sheet indicates pt is alert and oriented to self only. - Currently TF order via PEG: Osmolite 1.5 @ 60 mL/hr with 30 mL Prostat TID.  - This regimen is providing 2460 kcal (129% max estimated kcal need), 135 grams of protein (142% max estimated protein need), and 1097 mL free water.  - No new weight since 11/1. - Spoke with Dr. Darrick Meigs on the phone about current TF recommendations and he gives verbal order for RD to adjust TF regimen to better meet pt's estimated nutrition needs.  IVF: NS @ 75 mL/hr.    11/1 - Pt on Heart Healthy diet and consumed 25% of breakfast this AM.  - Pt reports that she typically drinks more than she eats; unable to obtain clear answer on chewing abilities.  - RN at bedside reports that pt had difficulty with swallowing pills this AM.  - RN reports no issues with TF infusing via PEG.  - Physical assessment shows severe fat wasting and moderate and mild muscle wasting to upper body; lower body not assessed at this time.  - Per chart review, pt has lost 5 lbs (5% body weight) in the past 3 months which is not significant for time frame.  - Currently ordered via PEG: Osmolite 1.5 @ 60 mL/hr with 30 mL Prostat TID.  - This regimen is providing 2460 kcal (129% max estimated kcal need), 135 grams of protein (  142% max estimated protein need), and 1097 mL free water.   IVF: NS @ 75 mL/hr.    Diet Order:  Diet Heart Room service appropriate? Yes; Fluid consistency: Thin Diet - low sodium heart healthy  Skin:  Wound (see comment) (Stage 4 sacral, stage 3 mid, lower back, and stage 2 R leg pressure injuries)  Last BM:  11/6  Height:   Ht Readings from Last 1 Encounters:  07/17/16 5' 6.5" (1.689 m)    Weight:   Wt Readings from Last 1 Encounters:  07/17/16 104 lb 15 oz (47.6 kg)    Ideal Body Weight:  60.23 kg  BMI:  Body mass index is 16.68 kg/m.  Estimated Nutritional Needs:   Kcal:  1665-1905 (35-40  kcal/kg)  Protein:  81-95 grams (1.7-2 grams/kg)  Fluid:  >/= 1.7 L/day  EDUCATION NEEDS:   No education needs identified at this time    Jarome Matin, MS, RD, LDN Inpatient Clinical Dietitian Pager # [phone] After hours/weekend pager # [phone]

## 2016-07-22 NOTE — Care Management Note (Signed)
Case Management Note  Patient Details  Name: Felix AhmadiDiane Krog MRN: [id number] Date of Birth: 02/06/1961  Subjective/Objective:                    Action/Plan:dc SNF.   Expected Discharge Date:   (unknown)               Expected Discharge Plan:  Skilled Nursing Facility  In-House Referral:  Clinical Social Work  Discharge planning Services  CM Consult  Post Acute Care Choice:    Choice offered to:     DME Arranged:    DME Agency:     HH Arranged:    HH Agency:     Status of Service:  Completed, signed off  If discussed at [employer] Meetings, dates discussed:    Additional Comments:  Lanier ClamMahabir, Larrell Rapozo, RN 07/22/2016, 10:58 AM

## 2016-07-23 ENCOUNTER — Non-Acute Institutional Stay (SKILLED_NURSING_FACILITY): Payer: Medicaid Other | Admitting: Internal Medicine

## 2016-07-23 ENCOUNTER — Encounter: Payer: Self-pay | Admitting: Internal Medicine

## 2016-07-23 DIAGNOSIS — I6930 Unspecified sequelae of cerebral infarction: Secondary | ICD-10-CM

## 2016-07-23 DIAGNOSIS — I10 Essential (primary) hypertension: Secondary | ICD-10-CM | POA: Diagnosis not present

## 2016-07-23 DIAGNOSIS — T83511A Infection and inflammatory reaction due to indwelling urethral catheter, initial encounter: Secondary | ICD-10-CM | POA: Diagnosis not present

## 2016-07-23 DIAGNOSIS — L8944 Pressure ulcer of contiguous site of back, buttock and hip, stage 4: Secondary | ICD-10-CM | POA: Diagnosis not present

## 2016-07-23 DIAGNOSIS — D638 Anemia in other chronic diseases classified elsewhere: Secondary | ICD-10-CM | POA: Diagnosis not present

## 2016-07-23 DIAGNOSIS — N390 Urinary tract infection, site not specified: Secondary | ICD-10-CM | POA: Diagnosis not present

## 2016-07-23 DIAGNOSIS — E43 Unspecified severe protein-calorie malnutrition: Secondary | ICD-10-CM | POA: Diagnosis not present

## 2016-07-23 DIAGNOSIS — E878 Other disorders of electrolyte and fluid balance, not elsewhere classified: Secondary | ICD-10-CM | POA: Diagnosis not present

## 2016-07-23 DIAGNOSIS — Z933 Colostomy status: Secondary | ICD-10-CM

## 2016-07-23 DIAGNOSIS — D72829 Elevated white blood cell count, unspecified: Secondary | ICD-10-CM

## 2016-07-23 DIAGNOSIS — R627 Adult failure to thrive: Secondary | ICD-10-CM

## 2016-07-23 DIAGNOSIS — Z931 Gastrostomy status: Secondary | ICD-10-CM | POA: Diagnosis not present

## 2016-07-23 NOTE — Progress Notes (Signed)
Patient ID: Averly Ericson, female   DOB: Oct 15, 1960, 55 y.o.   MRN: [id number]    HISTORY AND PHYSICAL   DATE: 07/23/2016  Location:      [facility] Room Number: 112 B Place of Service: SNF (31)   Extended Emergency Contact Information Primary Emergency Contact: Caslin,Katawbba Address: [address]          [place], [place] [address] Johnnette Litter of [place] Phone: [phone] Relation: Daughter Secondary Emergency Contact: Lonny Prude Address: [address]          [place], [place] [address] Johnnette Litter of [place] Mobile Phone: [phone] Relation: Daughter  Advanced Directive information Does patient have an advance directive?: No, Would patient like information on creating an advanced directive?: No - patient declined information  Chief Complaint  Patient presents with  . Readmit To SNF    HPI:  55 yo female long term resident seen today as a readmission into SNF following hospital stay for hyperkalemia, pressure injury of skin, UTI and NSVT. K+ tx aggressively with IVF, Ca gluconate and regular insulin. Urine cx (+) 40K colonies pseudomonas and PICC line reinserted for cefepime infusion (STOP DATE 11/13th). She had a 7 beat run of NSVT and was started on metoprolol. Wound care followed for wound vac tx of sacral ulcer. Her Mg had to be repleted as level was 1.5. WBC 12.2K; plts 558K; Hgb 6.6 ---> 1 unit PRBC -->7.7; albumin 1.3; K+ 7.2-->4.8 at d/c. She presents to SNF for continued long term care.  She is upset today as wound care is addressing her wound during exam. Overall feels better. She is a poor historian due to expressive aphasia. Hx obtained from chart. She missed her dose of cefepime last night as it was not available at time it was due per nursing.  Hx CVA with left hemiparesis - stable on ASA 81 mg daily. She has dysphagia and is s/p Peg tube and receives nutrition via TF. She gets pleasure foods.     Stage IV sacral ulcer - has  wound vac in place and is followed by wound doctor. She has pain due to wound vac and is taking oxycodone  FTT - weight 104 lbs. She gets TF and  prostat 30 cc three time daily. Albumin 1.3  Depression with anxiety - mood stable on lexapro; depakote 200 mg every 8 hours for mood stabilization; ativan 0.5 mg every 8 hours as needed for anxiety  Hypertension - BP stable on clonidine 0.1 mg every 6 hours as needed for SBP >170. She is on metoprolol due to recent NSVT in hospital  Hx Anemia - she is s/p 1 unit PRBC prior to d/c. Hgb 7.1. 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 Decubitus ulcer of sacral region, stage 4 ([place])   . Depression   . Dysphagia   . Dysphagia as late effect of cerebrovascular disease   . Hyperlipidemia   . Hypertension   . Seizures ([place])   . Stroke [facility])     Past Surgical History:  Procedure Laterality Date  . 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 COLOSTOMY    . PEG TUBE PLACEMENT      Patient Care Team: Gildardo Cranker, DO as PCP - General (Internal Medicine) Gerlene Fee, NP as Nurse Practitioner (Geriatric Medicine) [facility] (Skilled  Nursing Facility)  Social History   Social History  . Marital status: Divorced    Spouse name: N/A  . Number of children: N/A  . Years of education: N/A   Occupational History  . Not on file.   Social History Main Topics  . Smoking status: Former Research scientist (life sciences)  . Smokeless tobacco: Never Used  . Alcohol use No  . Drug use: No  . Sexual activity: No   Other Topics Concern  . Not on file   Social History Narrative  . No narrative on file     reports that she has quit smoking. She has never used smokeless tobacco. She reports that she does not drink alcohol or use drugs.  Family History  Problem Relation Age of Onset  . Family history unknown: Yes   No family status  information on file.    Immunization History  Administered Date(s) Administered  . Influenza-Unspecified 07/26/2015, 07/12/2016  . PPD Test 04/09/2016, 05/21/2016  . Pneumococcal-Unspecified 04/12/2016    No Known Allergies  Medications: Patient's Medications  New Prescriptions   No medications on file  Previous Medications   ACETAMINOPHEN (TYLENOL) 325 MG TABLET    Place 650 mg into feeding tube every 4 (four) hours as needed for mild pain.    ALBUTEROL (PROVENTIL) (2.5 MG/3ML) 0.083% NEBULIZER SOLUTION    Take 2.5 mg by nebulization every 4 (four) hours as needed for wheezing or shortness of breath.   ASCORBIC ACID (VITAMIN C) 500 MG TABLET    Take 500 mg by mouth daily.   ASPIRIN EC 81 MG TABLET    Take 81 mg by mouth daily. Via G-Tube    BISACODYL (DULCOLAX) 10 MG SUPPOSITORY    Place 10 mg rectally daily as needed for moderate constipation.   CEFEPIME 1 G IN DEXTROSE 5 % 50 ML    Inject 1 g into the vein every 12 (twelve) hours.   CLONIDINE (CATAPRES) 0.1 MG TABLET    Take 0.1 mg by mouth every 6 (six) hours as needed (blood pressure).    ESCITALOPRAM (LEXAPRO) 5 MG TABLET    Place 10 mg into feeding tube daily.   IPRATROPIUM-ALBUTEROL (DUONEB) 0.5-2.5 (3) MG/3ML SOLN    Take 3 mLs by nebulization every 6 (six) hours as needed (sob).    LORAZEPAM (ATIVAN) 0.5 MG TABLET    Place 1 tablet (0.5 mg total) into feeding tube every 8 (eight) hours as needed for anxiety.   MAGNESIUM HYDROXIDE (MILK OF MAGNESIA) 400 MG/5ML SUSPENSION    Take 30 mLs by mouth daily as needed for mild constipation.   METOPROLOL TARTRATE (LOPRESSOR) 25 MG TABLET    Take 1 tablet (25 mg total) by mouth 2 (two) times daily.   MULTIPLE VITAMINS-MINERALS (DECUBI-VITE) CAPS    Take 1 capsule by mouth daily.   NUTRITIONAL SUPPLEMENTS (FEEDING SUPPLEMENT, OSMOLITE 1.5 CAL,) LIQD    Place 1,000 mLs into feeding tube continuous.   OXYCODONE HCL, ABUSE DETER, (OXAYDO) 5 MG TABA    Take 5 mg by mouth every 6 (six) hours  as needed (pain).   PANTOPRAZOLE SODIUM (PROTONIX) 40 MG/20 ML PACK    Place 40 mg into feeding tube daily.   POLYETHYLENE GLYCOL (MIRALAX / GLYCOLAX) PACKET    Place 17 g into feeding tube daily.   PRAVASTATIN (PRAVACHOL) 40 MG TABLET    Place 40 mg into feeding tube daily.   PRENATAL CA CARB-B6-B12-FA (FOLBECAL) 1 MG TABS    Give 1 tablet by tube daily.  SODIUM CHLORIDE FLUSH 0.9 % SOLN INJECTION    Inject 10 mLs into the vein as needed.   UNABLE TO FIND    Med Name:House Supplement with meals for caloric support and weight loss nutritional treat 4 oz three times daily at meals   VALPROATE SODIUM (DEPAKENE PO)    Give 200 mg by tube every 8 (eight) hours.   ZINC 100 MG TABS    Take 200 mg by mouth daily.  Modified Medications   No medications on file  Discontinued Medications   AMINO ACIDS-PROTEIN HYDROLYS (FEEDING SUPPLEMENT, PRO-STAT SUGAR FREE 64,) LIQD    Place 30 mLs into feeding tube 3 (three) times daily with meals.    Review of Systems  Unable to perform ROS: Other (expressive aphasia)    Vitals:   07/23/16 0945  BP: (!) 150/60  Pulse: 97  Resp: 20  Temp: 99 F (37.2 C)  TempSrc: Oral  SpO2: 97%  Weight: 104 lb 15 oz (47.6 kg)  Height: 5' 3"  (1.6 m)   Body mass index is 18.59 kg/m.  Physical Exam  Constitutional: She appears well-developed.  Frail appearing in NAD; lying in bed  HENT:  Mouth/Throat: Oropharynx is clear and moist. No oropharyngeal exudate.  MMM  Eyes: Pupils are equal, round, and reactive to light. No scleral icterus.  Neck: Neck supple. Carotid bruit is not present. No tracheal deviation present. No thyromegaly present.  Cardiovascular: Regular rhythm and intact distal pulses.   No extrasystoles are present. Tachycardia present.  Exam reveals no gallop and no friction rub.   Murmur (1/6 SEM) heard. Left AKA; +1 pitting RLE edema. No calf TTP. Right arm PICC line intact with no redness or d/c at insertion site  Pulmonary/Chest: Effort normal and  breath sounds normal. No stridor. No respiratory distress. She has no wheezes. She has no rales.  CTA anteriorly  Abdominal: Soft. Bowel sounds are normal. She exhibits no distension and no mass. There is no hepatomegaly. There is no tenderness. There is no rebound and no guarding.    Genitourinary:  Genitourinary Comments: Foley DTG dark yellow urine  Musculoskeletal: She exhibits edema and deformity (LE contractures).  Contracted (flexion) LUE at elbow and fingers; left AKA looks well healed. No redness or TTP  Lymphadenopathy:    She has no cervical adenopathy.  Neurological: She is alert.  Left hemiparesis and hemiplegia; expressive aphasia  Skin: Skin is warm and dry. No rash noted.  Large Sacral decub stage IV with clean base and no purulent d/c. Wound vac intact  Psychiatric: She has a normal mood and affect. Her behavior is normal.     Labs reviewed: Admission on 07/16/2016, Discharged on 07/22/2016  Component Date Value Ref Range Status  . Lactic Acid, Venous 07/16/2016 1.84  0.5 - 1.9 mmol/L Final  . Sodium 07/16/2016 133* 135 - 145 mmol/L Final  . Potassium 07/16/2016 5.2* 3.5 - 5.1 mmol/L Final   Comment: SLIGHT HEMOLYSIS DELTA CHECK NOTED   . Chloride 07/16/2016 102  101 - 111 mmol/L Final  . CO2 07/16/2016 26  22 - 32 mmol/L Final  . Glucose, Bld 07/16/2016 156* 65 - 99 mg/dL Final  . BUN 07/16/2016 61* 6 - 20 mg/dL Final  . Creatinine, Ser 07/16/2016 0.48  0.44 - 1.00 mg/dL Final  . Calcium 07/16/2016 11.0* 8.9 - 10.3 mg/dL Final  . Total Protein 07/16/2016 6.0* 6.5 - 8.1 g/dL Final  . Albumin 07/16/2016 1.5* 3.5 - 5.0 g/dL Final  . AST 07/16/2016  25  15 - 41 U/L Final  . ALT 07/16/2016 11* 14 - 54 U/L Final  . Alkaline Phosphatase 07/16/2016 90  38 - 126 U/L Final  . Total Bilirubin 07/16/2016 0.7  0.3 - 1.2 mg/dL Final  . GFR calc non Af Amer 07/16/2016 >60  >60 mL/min Final  . GFR calc Af Amer 07/16/2016 >60  >60 mL/min Final   Comment: (NOTE) The eGFR has  been calculated using the CKD EPI equation. This calculation has not been validated in all clinical situations. eGFR's persistently <60 mL/min signify possible Chronic Kidney Disease.   . Anion gap 07/16/2016 5  5 - 15 Final  . WBC 07/16/2016 11.8* 4.0 - 10.5 K/uL Final  . RBC 07/16/2016 2.37* 3.87 - 5.11 MIL/uL Final  . Hemoglobin 07/16/2016 6.6* 12.0 - 15.0 g/dL Final   Comment: CRITICAL RESULT CALLED TO, READ BACK BY AND VERIFIED WITH: J.TALKINGTON RN AT 1943 ON 07/16/16 BY S.VANHOORNE   . HCT 07/16/2016 21.7* 36.0 - 46.0 % Final  . MCV 07/16/2016 91.6  78.0 - 100.0 fL Final  . MCH 07/16/2016 27.8  26.0 - 34.0 pg Final  . MCHC 07/16/2016 30.4  30.0 - 36.0 g/dL Final  . RDW 07/16/2016 20.0* 11.5 - 15.5 % Final  . Platelets 07/16/2016 567* 150 - 400 K/uL Final  . Neutrophils Relative % 07/16/2016 64  % Final  . Neutro Abs 07/16/2016 7.5  1.7 - 7.7 K/uL Final  . Lymphocytes Relative 07/16/2016 16  % Final  . Lymphs Abs 07/16/2016 1.9  0.7 - 4.0 K/uL Final  . Monocytes Relative 07/16/2016 7  % Final  . Monocytes Absolute 07/16/2016 0.9  0.1 - 1.0 K/uL Final  . Eosinophils Relative 07/16/2016 13  % Final  . Eosinophils Absolute 07/16/2016 1.5* 0.0 - 0.7 K/uL Final  . Basophils Relative 07/16/2016 0  % Final  . Basophils Absolute 07/16/2016 0.0  0.0 - 0.1 K/uL Final  . Color, Urine 07/16/2016 YELLOW  YELLOW Final  . APPearance 07/16/2016 CLOUDY* CLEAR Final  . Specific Gravity, Urine 07/16/2016 1.019  1.005 - 1.030 Final  . pH 07/16/2016 7.5  5.0 - 8.0 Final  . Glucose, UA 07/16/2016 NEGATIVE  NEGATIVE mg/dL Final  . Hgb urine dipstick 07/16/2016 NEGATIVE  NEGATIVE Final  . Bilirubin Urine 07/16/2016 NEGATIVE  NEGATIVE Final  . Ketones, ur 07/16/2016 NEGATIVE  NEGATIVE mg/dL Final  . Protein, ur 07/16/2016 NEGATIVE  NEGATIVE mg/dL Final  . Nitrite 07/16/2016 POSITIVE* NEGATIVE Final  . Leukocytes, UA 07/16/2016 MODERATE* NEGATIVE Final  . Sodium 07/16/2016 128* 135 - 145 mmol/L  Final  . Potassium 07/16/2016 7.2* 3.5 - 5.1 mmol/L Final  . Chloride 07/16/2016 98* 101 - 111 mmol/L Final  . BUN 07/16/2016 85* 6 - 20 mg/dL Final  . Creatinine, Ser 07/16/2016 0.70  0.44 - 1.00 mg/dL Final  . Glucose, Bld 07/16/2016 87  65 - 99 mg/dL Final  . Calcium, Ion 07/16/2016 1.13* 1.15 - 1.40 mmol/L Final  . TCO2 07/16/2016 28  0 - 100 mmol/L Final  . Hemoglobin 07/16/2016 8.8* 12.0 - 15.0 g/dL Final  . HCT 07/16/2016 26.0* 36.0 - 46.0 % Final  . Comment 07/16/2016 NOTIFIED PHYSICIAN   Final  . Squamous Epithelial / LPF 07/16/2016 6-30* NONE SEEN Final  . WBC, UA 07/16/2016 6-30  0 - 5 WBC/hpf Final  . RBC / HPF 07/16/2016 0-5  0 - 5 RBC/hpf Final  . Bacteria, UA 07/16/2016 FEW* NONE SEEN Final  . ABO/RH(D) 07/18/2016 Jenetta Downer  POS   Final  . Antibody Screen 07/18/2016 NEG   Final  . Sample Expiration 07/18/2016 07/19/2016   Final  . Unit Number 07/18/2016 [id number]   Final  . Blood Component Type 07/18/2016 RED CELLS,LR   Final  . Unit division 07/18/2016 00   Final  . Status of Unit 07/18/2016 ISSUED,FINAL   Final  . Transfusion Status 07/18/2016 OK TO TRANSFUSE   Final  . Crossmatch Result 07/18/2016 Compatible   Final  . Order Confirmation 07/16/2016 ORDER PROCESSED BY BLOOD BANK   Final  . Vitamin B-12 07/17/2016 647  180 - 914 pg/mL Final   Comment: (NOTE) This assay is not validated for testing neonatal or myeloproliferative syndrome specimens for Vitamin B12 levels. Performed at [facility]   . Folate 07/17/2016 57.0  >5.9 ng/mL Final   Comment: RESULTS CONFIRMED BY MANUAL DILUTION Performed at [facility]   . Iron 07/17/2016 8* 28 - 170 ug/dL Final  . TIBC 07/17/2016 197* 250 - 450 ug/dL Final  . Saturation Ratios 07/17/2016 4* 10.4 - 31.8 % Final  . UIBC 07/17/2016 189  ug/dL Final  . Ferritin 07/17/2016 346* 11 - 307 ng/mL Final  . Retic Ct Pct 07/16/2016 5.0* 0.4 - 3.1 % Final  . RBC. 07/16/2016 3.86* 3.87 - 5.11 MIL/uL Final  . Retic  Count, Manual 07/16/2016 193.0* 19.0 - 186.0 K/uL Final  . Sodium 07/17/2016 135  135 - 145 mmol/L Final  . Potassium 07/17/2016 5.1  3.5 - 5.1 mmol/L Final  . Chloride 07/17/2016 102  101 - 111 mmol/L Final  . CO2 07/17/2016 23  22 - 32 mmol/L Final  . Glucose, Bld 07/17/2016 137* 65 - 99 mg/dL Final  . BUN 07/17/2016 46* 6 - 20 mg/dL Final  . Creatinine, Ser 07/17/2016 0.40* 0.44 - 1.00 mg/dL Final  . Calcium 07/17/2016 8.4* 8.9 - 10.3 mg/dL Final   Comment: DELTA CHECK NOTED REPEATED TO VERIFY   . Total Protein 07/17/2016 6.5  6.5 - 8.1 g/dL Final  . Albumin 07/17/2016 1.6* 3.5 - 5.0 g/dL Final  . AST 07/17/2016 20  15 - 41 U/L Final  . ALT 07/17/2016 13* 14 - 54 U/L Final  . Alkaline Phosphatase 07/17/2016 95  38 - 126 U/L Final  . Total Bilirubin 07/17/2016 0.7  0.3 - 1.2 mg/dL Final  . GFR calc non Af Amer 07/17/2016 >60  >60 mL/min Final  . GFR calc Af Amer 07/17/2016 >60  >60 mL/min Final   Comment: (NOTE) The eGFR has been calculated using the CKD EPI equation. This calculation has not been validated in all clinical situations. eGFR's persistently <60 mL/min signify possible Chronic Kidney Disease.   . Anion gap 07/17/2016 10  5 - 15 Final  . WBC 07/17/2016 13.7* 4.0 - 10.5 K/uL Final  . RBC 07/17/2016 2.99* 3.87 - 5.11 MIL/uL Final  . Hemoglobin 07/17/2016 8.3* 12.0 - 15.0 g/dL Final   Comment: DELTA CHECK NOTED POST TRANSFUSION SPECIMEN   . HCT 07/17/2016 26.7* 36.0 - 46.0 % Final  . MCV 07/17/2016 89.3  78.0 - 100.0 fL Final  . MCH 07/17/2016 27.8  26.0 - 34.0 pg Final  . MCHC 07/17/2016 31.1  30.0 - 36.0 g/dL Final  . RDW 07/17/2016 18.9* 11.5 - 15.5 % Final  . Platelets 07/17/2016 580* 150 - 400 K/uL Final  . Specimen Description 07/22/2016 BLOOD RIGHT WRIST   Final  . Special Requests 07/22/2016 BOTTLES DRAWN AEROBIC AND ANAEROBIC 5CC   Final  .  Culture 07/22/2016    Final                   Value:NO GROWTH 5 DAYS Performed at [facility]   . Report  Status 07/22/2016 07/22/2016 FINAL   Final  . Specimen Description 07/22/2016 BLOOD BLOOD LEFT FOREARM   Final  . Special Requests 07/22/2016 IN PEDIATRIC BOTTLE 2CC   Final  . Culture 07/22/2016    Final                   Value:NO GROWTH 5 DAYS Performed at [facility]   . Report Status 07/22/2016 07/22/2016 FINAL   Final  . Haptoglobin 07/18/2016 367* 34 - 200 mg/dL Final   Comment: (NOTE) Performed At: [facility] [place], [place] [id number] Lindon Romp MD [id number]   . LDH 07/16/2016 145  98 - 192 U/L Final  . ABO/RH(D) 07/16/2016 O POS   Final  . MRSA by PCR 07/17/2016 POSITIVE* NEGATIVE Final   Comment:        The GeneXpert MRSA Assay (FDA approved for NASAL specimens only), is one component of a comprehensive MRSA colonization surveillance program. It is not intended to diagnose MRSA infection nor to guide or monitor treatment for MRSA infections. RESULT CALLED TO, READ BACK BY AND VERIFIED WITH: Therese Sarah RN [id number] 07/17/16 A NAVARRO   . Specimen Description 07/21/2016 URINE, RANDOM   Final  . Special Requests 07/21/2016 NONE   Final  . Culture 07/21/2016 40,000 COLONIES/mL PSEUDOMONAS AERUGINOSA*  Final  . Report Status 07/21/2016 07/21/2016 FINAL   Final  . Organism ID, Bacteria 07/21/2016 PSEUDOMONAS AERUGINOSA*  Final  . WBC 07/19/2016 13.8* 4.0 - 10.5 K/uL Final  . RBC 07/19/2016 2.88* 3.87 - 5.11 MIL/uL Final  . Hemoglobin 07/19/2016 7.9* 12.0 - 15.0 g/dL Final  . HCT 07/19/2016 26.1* 36.0 - 46.0 % Final  . MCV 07/19/2016 90.6  78.0 - 100.0 fL Final  . MCH 07/19/2016 27.4  26.0 - 34.0 pg Final  . MCHC 07/19/2016 30.3  30.0 - 36.0 g/dL Final  . RDW 07/19/2016 18.2* 11.5 - 15.5 % Final  . Platelets 07/19/2016 531* 150 - 400 K/uL Final  . Sodium 07/19/2016 137  135 - 145 mmol/L Final  . Potassium 07/19/2016 4.9  3.5 - 5.1 mmol/L Final  . Chloride 07/19/2016 107  101 - 111 mmol/L Final  . CO2 07/19/2016 24  22 - 32  mmol/L Final  . Glucose, Bld 07/19/2016 130* 65 - 99 mg/dL Final  . BUN 07/19/2016 23* 6 - 20 mg/dL Final  . Creatinine, Ser 07/19/2016 <0.30* 0.44 - 1.00 mg/dL Final  . Calcium 07/19/2016 8.4* 8.9 - 10.3 mg/dL Final  . GFR calc non Af Amer 07/19/2016 NOT CALCULATED  >60 mL/min Final  . GFR calc Af Amer 07/19/2016 NOT CALCULATED  >60 mL/min Final   Comment: (NOTE) The eGFR has been calculated using the CKD EPI equation. This calculation has not been validated in all clinical situations. eGFR's persistently <60 mL/min signify possible Chronic Kidney Disease.   . Anion gap 07/19/2016 6  5 - 15 Final  . WBC 07/20/2016 11.5* 4.0 - 10.5 K/uL Final  . RBC 07/20/2016 2.66* 3.87 - 5.11 MIL/uL Final  . Hemoglobin 07/20/2016 7.4* 12.0 - 15.0 g/dL Final  . HCT 07/20/2016 24.2* 36.0 - 46.0 % Final  . MCV 07/20/2016 91.0  78.0 - 100.0 fL Final  . MCH 07/20/2016 27.8  26.0 - 34.0  pg Final  . MCHC 07/20/2016 30.6  30.0 - 36.0 g/dL Final  . RDW 07/20/2016 17.9* 11.5 - 15.5 % Final  . Platelets 07/20/2016 551* 150 - 400 K/uL Final  . Fecal Occult Bld 07/20/2016 NEGATIVE  NEGATIVE Final  . Fecal Occult Bld 07/21/2016 NEGATIVE  NEGATIVE Final  . WBC 07/21/2016 12.2* 4.0 - 10.5 K/uL Final  . RBC 07/21/2016 2.73* 3.87 - 5.11 MIL/uL Final  . Hemoglobin 07/21/2016 7.7* 12.0 - 15.0 g/dL Final  . HCT 07/21/2016 25.0* 36.0 - 46.0 % Final  . MCV 07/21/2016 91.6  78.0 - 100.0 fL Final  . MCH 07/21/2016 28.2  26.0 - 34.0 pg Final  . MCHC 07/21/2016 30.8  30.0 - 36.0 g/dL Final  . RDW 07/21/2016 17.6* 11.5 - 15.5 % Final  . Platelets 07/21/2016 558* 150 - 400 K/uL Final  . Sodium 07/21/2016 136  135 - 145 mmol/L Final  . Potassium 07/21/2016 4.8  3.5 - 5.1 mmol/L Final  . Chloride 07/21/2016 106  101 - 111 mmol/L Final  . CO2 07/21/2016 23  22 - 32 mmol/L Final  . Glucose, Bld 07/21/2016 121* 65 - 99 mg/dL Final  . BUN 07/21/2016 21* 6 - 20 mg/dL Final  . Creatinine, Ser 07/21/2016 <0.30* 0.44 - 1.00 mg/dL  Final  . Calcium 07/21/2016 8.1* 8.9 - 10.3 mg/dL Final  . Total Protein 07/21/2016 6.0* 6.5 - 8.1 g/dL Final  . Albumin 07/21/2016 1.3* 3.5 - 5.0 g/dL Final  . AST 07/21/2016 13* 15 - 41 U/L Final  . ALT 07/21/2016 11* 14 - 54 U/L Final  . Alkaline Phosphatase 07/21/2016 82  38 - 126 U/L Final  . Total Bilirubin 07/21/2016 0.4  0.3 - 1.2 mg/dL Final  . GFR calc non Af Amer 07/21/2016 NOT CALCULATED  >60 mL/min Final  . GFR calc Af Amer 07/21/2016 NOT CALCULATED  >60 mL/min Final   Comment: (NOTE) The eGFR has been calculated using the CKD EPI equation. This calculation has not been validated in all clinical situations. eGFR's persistently <60 mL/min signify possible Chronic Kidney Disease.   . Anion gap 07/21/2016 7  5 - 15 Final  . Fecal Occult Bld 07/22/2016 NEGATIVE  NEGATIVE Final  . Magnesium 07/21/2016 1.5* 1.7 - 2.4 mg/dL Final  Nursing Home on 07/16/2016  Component Date Value Ref Range Status  . Hemoglobin 07/16/2016 7.7* 12.0 - 16.0 g/dL Final  . HCT 07/16/2016 26* 36 - 46 % Final  . Platelets 07/16/2016 601* 150 - 399 K/L Final  . WBC 07/16/2016 16.5  10^3/mL Final  . Glucose 07/16/2016 137  mg/dL Final  . BUN 07/16/2016 62* 4 - 21 mg/dL Final  . Creatinine 07/16/2016 0.4* 0.5 - 1.1 mg/dL Final  . Potassium 07/16/2016 6.9* 3.4 - 5.3 mmol/L Final  . Sodium 07/16/2016 133* 137 - 147 mmol/L Final  . Alkaline Phosphatase 07/16/2016 127* 25 - 125 U/L Final  . ALT 07/16/2016 11  7 - 35 U/L Final  . AST 07/16/2016 12* 13 - 35 U/L Final  Nursing Home on 06/20/2016  Component Date Value Ref Range Status  . Hemoglobin 05/24/2016 8.4* 12.0 - 16.0 g/dL Final  . HCT 05/24/2016 28* 36 - 46 % Final  . Platelets 05/24/2016 475* 150 - 399 K/L Final  . WBC 05/24/2016 9.1  10^3/mL Final  . Glucose 05/24/2016 119  mg/dL Final  . BUN 05/24/2016 11  4 - 21 mg/dL Final  . Creatinine 05/24/2016 0.3* 0.5 - 1.1 mg/dL Final  .  Potassium 05/24/2016 4.8  3.4 - 5.3 mmol/L Final  . Sodium  05/24/2016 137  137 - 147 mmol/L Final  Admission on 05/16/2016, Discharged on 05/21/2016  No results displayed because visit has over 200 results.      No results found.   Assessment/Plan   ICD-9-CM ICD-10-CM   1. Urinary tract infection associated with indwelling urethral catheter, initial encounter ([place]) 996.64 T83.511A    599.0 N39.0   2. Leukocytosis, unspecified type 288.60 D72.829   3. Electrolyte imbalance 276.9 E87.8   4. Protein-calorie malnutrition, severe 262 E43   5. Pressure ulcer of contiguous region involving back, buttock, and hip, stage 4, unspecified laterality ([place]) 707.09 L89.44    707.24    6. PEG (percutaneous endoscopic gastrostomy) status ([place]) V44.1 Z93.1   7. Colostomy present ([place]) V44.3 Z93.3   8. Failure to thrive syndrome, adult 783.7 R62.7   9. Anemia of chronic disease 285.29 D63.8   10. Essential hypertension, benign 401.1 I10   11. History of CVA with residual deficit 438.9 I69.30      Cont cefepime as ordered x 7 days. STOP DATE 11/13th  Check BMP, Mg and CBC  Cont other meds as ordered  PT/OT/ST as indicated  Wound care as ordered. Wound vac care as indicated  Peg tube care/foley cath care as indicated  TF as ordered  PICC line care as indicated  GOAL: long term care. Communicated with pt and nursing.  Will follow  Kendra Woolford S. Perlie Gold  [name] [address] [place], [place] [address] [phone] Cell (Monday-Friday 8 AM - 5 PM) [phone] After 5 PM and follow prompts

## 2016-07-25 ENCOUNTER — Encounter: Payer: Self-pay | Admitting: Adult Health

## 2016-07-25 LAB — BASIC METABOLIC PANEL
BUN: 22 mg/dL — AB (ref 4–21)
CREATININE: 0.2 mg/dL — AB (ref 0.5–1.1)
Glucose: 113 mg/dL
POTASSIUM: 5.6 mmol/L — AB (ref 3.4–5.3)
SODIUM: 131 mmol/L — AB (ref 137–147)

## 2016-07-25 LAB — CBC AND DIFFERENTIAL
HCT: 25 % — AB (ref 36–46)
HEMOGLOBIN: 7.5 g/dL — AB (ref 12.0–16.0)
Platelets: 404 10*3/uL — AB (ref 150–399)
WBC: 11 10^3/mL

## 2016-07-25 LAB — MAGNESIUM: MAGNESIUM: 1.5

## 2016-08-08 ENCOUNTER — Emergency Department (HOSPITAL_COMMUNITY): Payer: Medicaid Other

## 2016-08-08 ENCOUNTER — Encounter (HOSPITAL_COMMUNITY): Payer: Self-pay | Admitting: Emergency Medicine

## 2016-08-08 ENCOUNTER — Inpatient Hospital Stay (HOSPITAL_COMMUNITY)
Admission: EM | Admit: 2016-08-08 | Discharge: 2016-08-22 | DRG: 871 | Disposition: A | Discharge status: Skilled Nursing Facility | Payer: Medicaid Other | Attending: Internal Medicine | Admitting: Internal Medicine

## 2016-08-08 DIAGNOSIS — A4151 Sepsis due to Escherichia coli [E. coli]: Secondary | ICD-10-CM | POA: Diagnosis present

## 2016-08-08 DIAGNOSIS — I69922 Dysarthria following unspecified cerebrovascular disease: Secondary | ICD-10-CM

## 2016-08-08 DIAGNOSIS — D649 Anemia, unspecified: Secondary | ICD-10-CM | POA: Diagnosis present

## 2016-08-08 DIAGNOSIS — N12 Tubulo-interstitial nephritis, not specified as acute or chronic: Secondary | ICD-10-CM | POA: Diagnosis present

## 2016-08-08 DIAGNOSIS — L89154 Pressure ulcer of sacral region, stage 4: Secondary | ICD-10-CM | POA: Diagnosis present

## 2016-08-08 DIAGNOSIS — N390 Urinary tract infection, site not specified: Secondary | ICD-10-CM | POA: Diagnosis not present

## 2016-08-08 DIAGNOSIS — F329 Major depressive disorder, single episode, unspecified: Secondary | ICD-10-CM | POA: Diagnosis present

## 2016-08-08 DIAGNOSIS — R4182 Altered mental status, unspecified: Secondary | ICD-10-CM

## 2016-08-08 DIAGNOSIS — T83511A Infection and inflammatory reaction due to indwelling urethral catheter, initial encounter: Secondary | ICD-10-CM | POA: Diagnosis not present

## 2016-08-08 DIAGNOSIS — E875 Hyperkalemia: Secondary | ICD-10-CM | POA: Diagnosis present

## 2016-08-08 DIAGNOSIS — L8994 Pressure ulcer of unspecified site, stage 4: Secondary | ICD-10-CM | POA: Diagnosis present

## 2016-08-08 DIAGNOSIS — I10 Essential (primary) hypertension: Secondary | ICD-10-CM | POA: Diagnosis present

## 2016-08-08 DIAGNOSIS — K839 Disease of biliary tract, unspecified: Secondary | ICD-10-CM

## 2016-08-08 DIAGNOSIS — Z931 Gastrostomy status: Secondary | ICD-10-CM

## 2016-08-08 DIAGNOSIS — Z79899 Other long term (current) drug therapy: Secondary | ICD-10-CM | POA: Diagnosis not present

## 2016-08-08 DIAGNOSIS — Z87891 Personal history of nicotine dependence: Secondary | ICD-10-CM | POA: Diagnosis not present

## 2016-08-08 DIAGNOSIS — R509 Fever, unspecified: Secondary | ICD-10-CM

## 2016-08-08 DIAGNOSIS — M4628 Osteomyelitis of vertebra, sacral and sacrococcygeal region: Secondary | ICD-10-CM | POA: Diagnosis present

## 2016-08-08 DIAGNOSIS — I69391 Dysphagia following cerebral infarction: Secondary | ICD-10-CM

## 2016-08-08 DIAGNOSIS — L0291 Cutaneous abscess, unspecified: Secondary | ICD-10-CM

## 2016-08-08 DIAGNOSIS — Z7401 Bed confinement status: Secondary | ICD-10-CM

## 2016-08-08 DIAGNOSIS — Z89619 Acquired absence of unspecified leg above knee: Secondary | ICD-10-CM

## 2016-08-08 DIAGNOSIS — R410 Disorientation, unspecified: Secondary | ICD-10-CM | POA: Diagnosis present

## 2016-08-08 DIAGNOSIS — G40909 Epilepsy, unspecified, not intractable, without status epilepticus: Secondary | ICD-10-CM | POA: Diagnosis present

## 2016-08-08 DIAGNOSIS — M659 Synovitis and tenosynovitis, unspecified: Secondary | ICD-10-CM | POA: Diagnosis present

## 2016-08-08 DIAGNOSIS — Z933 Colostomy status: Secondary | ICD-10-CM

## 2016-08-08 DIAGNOSIS — Z89612 Acquired absence of left leg above knee: Secondary | ICD-10-CM | POA: Diagnosis not present

## 2016-08-08 DIAGNOSIS — M8668 Other chronic osteomyelitis, other site: Secondary | ICD-10-CM

## 2016-08-08 DIAGNOSIS — G9341 Metabolic encephalopathy: Secondary | ICD-10-CM | POA: Diagnosis present

## 2016-08-08 DIAGNOSIS — Z7982 Long term (current) use of aspirin: Secondary | ICD-10-CM

## 2016-08-08 DIAGNOSIS — I69354 Hemiplegia and hemiparesis following cerebral infarction affecting left non-dominant side: Secondary | ICD-10-CM | POA: Diagnosis not present

## 2016-08-08 DIAGNOSIS — I69991 Dysphagia following unspecified cerebrovascular disease: Secondary | ICD-10-CM | POA: Diagnosis not present

## 2016-08-08 DIAGNOSIS — A4152 Sepsis due to Pseudomonas: Secondary | ICD-10-CM | POA: Diagnosis present

## 2016-08-08 DIAGNOSIS — Z1612 Extended spectrum beta lactamase (ESBL) resistance: Secondary | ICD-10-CM | POA: Diagnosis present

## 2016-08-08 DIAGNOSIS — L02415 Cutaneous abscess of right lower limb: Secondary | ICD-10-CM | POA: Diagnosis present

## 2016-08-08 DIAGNOSIS — E785 Hyperlipidemia, unspecified: Secondary | ICD-10-CM | POA: Diagnosis present

## 2016-08-08 DIAGNOSIS — M869 Osteomyelitis, unspecified: Secondary | ICD-10-CM

## 2016-08-08 DIAGNOSIS — R6510 Systemic inflammatory response syndrome (SIRS) of non-infectious origin without acute organ dysfunction: Secondary | ICD-10-CM | POA: Diagnosis not present

## 2016-08-08 DIAGNOSIS — A419 Sepsis, unspecified organism: Secondary | ICD-10-CM

## 2016-08-08 DIAGNOSIS — L8944 Pressure ulcer of contiguous site of back, buttock and hip, stage 4: Secondary | ICD-10-CM | POA: Diagnosis not present

## 2016-08-08 DIAGNOSIS — G9340 Encephalopathy, unspecified: Secondary | ICD-10-CM | POA: Diagnosis present

## 2016-08-08 LAB — CBC WITH DIFFERENTIAL/PLATELET
BASOS ABS: 0 10*3/uL (ref 0.0–0.1)
BASOS PCT: 0 %
Basophils Absolute: 0 10*3/uL (ref 0.0–0.1)
Basophils Relative: 0 %
EOS ABS: 0.3 10*3/uL (ref 0.0–0.7)
EOS PCT: 4 %
Eosinophils Absolute: 0.5 10*3/uL (ref 0.0–0.7)
Eosinophils Relative: 2 %
HCT: 35 % — ABNORMAL LOW (ref 36.0–46.0)
HEMATOCRIT: 34.3 % — AB (ref 36.0–46.0)
HEMOGLOBIN: 10 g/dL — AB (ref 12.0–15.0)
HEMOGLOBIN: 9.7 g/dL — AB (ref 12.0–15.0)
LYMPHS PCT: 16 %
LYMPHS PCT: 18 %
Lymphs Abs: 2.6 10*3/uL (ref 0.7–4.0)
Lymphs Abs: 2 10*3/uL (ref 0.7–4.0)
MCH: 29.5 pg (ref 26.0–34.0)
MCH: 29.6 pg (ref 26.0–34.0)
MCHC: 28.6 g/dL — ABNORMAL LOW (ref 30.0–36.0)
MCHC: 28.3 g/dL — ABNORMAL LOW (ref 30.0–36.0)
MCV: 103.6 fL — ABNORMAL HIGH (ref 78.0–100.0)
MCV: 104.3 fL — AB (ref 78.0–100.0)
MONOS PCT: 12 %
MONOS PCT: 18 %
Monocytes Absolute: 2.6 10*3/uL — ABNORMAL HIGH (ref 0.1–1.0)
Monocytes Absolute: 1.5 10*3/uL — ABNORMAL HIGH (ref 0.1–1.0)
NEUTROS PCT: 62 %
Neutro Abs: 9.1 10*3/uL — ABNORMAL HIGH (ref 1.7–7.7)
Neutro Abs: 8.8 10*3/uL — ABNORMAL HIGH (ref 1.7–7.7)
Neutrophils Relative %: 68 %
Platelets: 304 10*3/uL (ref 150–400)
Platelets: 283 10*3/uL (ref 150–400)
RBC: 3.38 MIL/uL — ABNORMAL LOW (ref 3.87–5.11)
RBC: 3.29 MIL/uL — AB (ref 3.87–5.11)
RDW: 23.6 % — ABNORMAL HIGH (ref 11.5–15.5)
RDW: 23.4 % — ABNORMAL HIGH (ref 11.5–15.5)
WBC: 14.6 10*3/uL — ABNORMAL HIGH (ref 4.0–10.5)
WBC: 12.8 10*3/uL — AB (ref 4.0–10.5)

## 2016-08-08 LAB — COMPREHENSIVE METABOLIC PANEL
ALBUMIN: 1.4 g/dL — AB (ref 3.5–5.0)
ALK PHOS: 86 U/L (ref 38–126)
ALT: 15 U/L (ref 14–54)
ALT: 13 U/L — ABNORMAL LOW (ref 14–54)
ANION GAP: 4 — AB (ref 5–15)
ANION GAP: 6 (ref 5–15)
AST: 24 U/L (ref 15–41)
AST: 19 U/L (ref 15–41)
Albumin: 1.5 g/dL — ABNORMAL LOW (ref 3.5–5.0)
Alkaline Phosphatase: 78 U/L (ref 38–126)
BILIRUBIN TOTAL: 0.3 mg/dL (ref 0.3–1.2)
BUN: 34 mg/dL — ABNORMAL HIGH (ref 6–20)
BUN: 30 mg/dL — ABNORMAL HIGH (ref 6–20)
CALCIUM: 8.2 mg/dL — AB (ref 8.9–10.3)
CHLORIDE: 111 mmol/L (ref 101–111)
CHLORIDE: 113 mmol/L — AB (ref 101–111)
CO2: 26 mmol/L (ref 22–32)
CO2: 29 mmol/L (ref 22–32)
Calcium: 8.3 mg/dL — ABNORMAL LOW (ref 8.9–10.3)
Creatinine, Ser: 0.35 mg/dL — ABNORMAL LOW (ref 0.44–1.00)
Creatinine, Ser: 0.33 mg/dL — ABNORMAL LOW (ref 0.44–1.00)
GFR calc Af Amer: >60 mL/min (ref >60)
GFR calc non Af Amer: >60 mL/min (ref >60)
GLUCOSE: 92 mg/dL (ref 65–99)
Glucose, Bld: 90 mg/dL (ref 65–99)
POTASSIUM: 4.6 mmol/L (ref 3.5–5.1)
POTASSIUM: 4.7 mmol/L (ref 3.5–5.1)
SODIUM: 143 mmol/L (ref 135–145)
Sodium: 146 mmol/L — ABNORMAL HIGH (ref 135–145)
TOTAL PROTEIN: 6.3 g/dL — AB (ref 6.5–8.1)
Total Bilirubin: 0.4 mg/dL (ref 0.3–1.2)
Total Protein: 6.9 g/dL (ref 6.5–8.1)

## 2016-08-08 LAB — URINALYSIS, ROUTINE W REFLEX MICROSCOPIC
Bilirubin Urine: NEGATIVE
GLUCOSE, UA: NEGATIVE mg/dL
Ketones, ur: NEGATIVE mg/dL
Nitrite: POSITIVE — AB
Protein, ur: 30 mg/dL — AB
SPECIFIC GRAVITY, URINE: 1.024 (ref 1.005–1.030)
pH: 5.5 (ref 5.0–8.0)

## 2016-08-08 LAB — GLUCOSE, CAPILLARY
GLUCOSE-CAPILLARY: 130 mg/dL — AB (ref 65–99)
Glucose-Capillary: 89 mg/dL (ref 65–99)

## 2016-08-08 LAB — I-STAT CG4 LACTIC ACID, ED: LACTIC ACID, VENOUS: 1.14 mmol/L (ref 0.5–1.9)

## 2016-08-08 LAB — URINE MICROSCOPIC-ADD ON

## 2016-08-08 LAB — AMMONIA: AMMONIA: 26 umol/L (ref 9–35)

## 2016-08-08 LAB — VALPROIC ACID LEVEL

## 2016-08-08 MED ORDER — ALBUTEROL SULFATE (2.5 MG/3ML) 0.083% IN NEBU: 2.5000 mg | INHALATION_SOLUTION | RESPIRATORY_TRACT | Status: DC | PRN

## 2016-08-08 MED ORDER — BISACODYL 10 MG RE SUPP: 10.0000 mg | Freq: Every day | RECTAL | Status: DC | PRN

## 2016-08-08 MED ORDER — METOPROLOL TARTRATE 25 MG PO TABS
25.0000 mg | ORAL_TABLET | Freq: Two times a day (BID) | ORAL | Status: DC
  Administered 2016-08-08 – 2016-08-22 (×27): 25 mg
  Filled 2016-08-08 (×27): qty 1

## 2016-08-08 MED ORDER — ONDANSETRON HCL 4 MG/2ML IJ SOLN: 4.0000 mg | Freq: Four times a day (QID) | INTRAMUSCULAR | Status: DC | PRN

## 2016-08-08 MED ORDER — OSMOLITE 1.5 CAL PO LIQD
1000.0000 mL | ORAL | Status: DC
  Administered 2016-08-08 – 2016-08-21 (×15): 1000 mL
  Filled 2016-08-08 (×23): qty 1000

## 2016-08-08 MED ORDER — SODIUM CHLORIDE 0.9 % IV BOLUS (SEPSIS): 1000.0000 mL | Freq: Once | INTRAVENOUS | Status: DC

## 2016-08-08 MED ORDER — SODIUM CHLORIDE 0.9 % IV SOLN
INTRAVENOUS | Status: DC
  Administered 2016-08-08: 06:00:00 via INTRAVENOUS

## 2016-08-08 MED ORDER — OXYCODONE HCL 5 MG PO TABS
5.0000 mg | ORAL_TABLET | Freq: Four times a day (QID) | ORAL | Status: DC | PRN
  Administered 2016-08-08 – 2016-08-18 (×8): 5 mg via ORAL
  Filled 2016-08-08 (×10): qty 1

## 2016-08-08 MED ORDER — VITAMIN C 500 MG PO TABS
500.0000 mg | ORAL_TABLET | Freq: Every day | ORAL | Status: DC
  Administered 2016-08-08 – 2016-08-22 (×14): 500 mg via ORAL
  Filled 2016-08-08 (×14): qty 1

## 2016-08-08 MED ORDER — ACETAMINOPHEN 325 MG PO TABS
650.0000 mg | ORAL_TABLET | Freq: Four times a day (QID) | ORAL | Status: DC | PRN
  Administered 2016-08-08 – 2016-08-14 (×5): 650 mg via ORAL
  Filled 2016-08-08 (×7): qty 2

## 2016-08-08 MED ORDER — PRAVASTATIN SODIUM 40 MG PO TABS
40.0000 mg | ORAL_TABLET | Freq: Every day | ORAL | Status: DC
  Administered 2016-08-08 – 2016-08-22 (×14): 40 mg
  Filled 2016-08-08 (×14): qty 1

## 2016-08-08 MED ORDER — ENOXAPARIN SODIUM 40 MG/0.4ML ~~LOC~~ SOLN
40.0000 mg | SUBCUTANEOUS | Status: DC
  Administered 2016-08-08 – 2016-08-17 (×10): 40 mg via SUBCUTANEOUS
  Filled 2016-08-08 (×10): qty 0.4

## 2016-08-08 MED ORDER — ZINC SULFATE 220 (50 ZN) MG PO CAPS
220.0000 mg | ORAL_CAPSULE | Freq: Every day | ORAL | Status: DC
  Administered 2016-08-08 – 2016-08-22 (×14): 220 mg via ORAL
  Filled 2016-08-08 (×13): qty 1

## 2016-08-08 MED ORDER — ASPIRIN 81 MG PO CHEW
81.0000 mg | CHEWABLE_TABLET | Freq: Every day | ORAL | Status: DC
  Administered 2016-08-08 – 2016-08-22 (×14): 81 mg via ORAL
  Filled 2016-08-08 (×14): qty 1

## 2016-08-08 MED ORDER — FREE WATER
200.0000 mL | Freq: Three times a day (TID) | Status: DC
  Administered 2016-08-08 – 2016-08-22 (×40): 200 mL

## 2016-08-08 MED ORDER — ONDANSETRON HCL 4 MG PO TABS: 4.0000 mg | ORAL_TABLET | Freq: Four times a day (QID) | ORAL | Status: DC | PRN

## 2016-08-08 MED ORDER — VALPROATE SODIUM 250 MG/5ML PO SOLN
200.0000 mg | Freq: Three times a day (TID) | ORAL | Status: DC
  Administered 2016-08-08 – 2016-08-22 (×40): 200 mg
  Filled 2016-08-08 (×45): qty 5

## 2016-08-08 MED ORDER — POLYETHYLENE GLYCOL 3350 17 G PO PACK
17.0000 g | PACK | Freq: Every day | ORAL | Status: DC
  Administered 2016-08-08 – 2016-08-22 (×11): 17 g
  Filled 2016-08-08 (×13): qty 1

## 2016-08-08 MED ORDER — DEXTROSE 5 % IV SOLN
1.0000 g | Freq: Two times a day (BID) | INTRAVENOUS | Status: DC
  Administered 2016-08-08 – 2016-08-11 (×6): 1 g via INTRAVENOUS
  Filled 2016-08-08 (×7): qty 1

## 2016-08-08 MED ORDER — FA-PYRIDOXINE-CYANOCOBALAMIN 2.5-25-2 MG PO TABS
1.0000 | ORAL_TABLET | Freq: Every day | ORAL | Status: DC
  Administered 2016-08-08 – 2016-08-22 (×14): 1
  Filled 2016-08-08 (×15): qty 1

## 2016-08-08 MED ORDER — DEXTROSE 5 % IV SOLN
2.0000 g | Freq: Once | INTRAVENOUS | Status: AC
  Administered 2016-08-08: 2 g via INTRAVENOUS
  Filled 2016-08-08: qty 2

## 2016-08-08 MED ORDER — HYDRALAZINE HCL 20 MG/ML IJ SOLN
10.0000 mg | INTRAMUSCULAR | Status: DC | PRN
  Administered 2016-08-09 – 2016-08-11 (×2): 10 mg via INTRAVENOUS
  Filled 2016-08-08 (×2): qty 1

## 2016-08-08 MED ORDER — PROSIGHT PO TABS
1.0000 | ORAL_TABLET | Freq: Every day | ORAL | Status: DC
  Administered 2016-08-08 – 2016-08-22 (×14): 1 via ORAL
  Filled 2016-08-08 (×15): qty 1

## 2016-08-08 MED ORDER — IPRATROPIUM-ALBUTEROL 0.5-2.5 (3) MG/3ML IN SOLN: 3.0000 mL | Freq: Four times a day (QID) | RESPIRATORY_TRACT | Status: DC | PRN

## 2016-08-08 MED ORDER — LORAZEPAM 0.5 MG PO TABS
0.5000 mg | ORAL_TABLET | Freq: Three times a day (TID) | ORAL | Status: DC | PRN
  Administered 2016-08-16 (×2): 0.5 mg
  Filled 2016-08-08 (×2): qty 1

## 2016-08-08 MED ORDER — PRO-STAT SUGAR FREE PO LIQD
30.0000 mL | Freq: Three times a day (TID) | ORAL | Status: DC
  Administered 2016-08-08 – 2016-08-22 (×38): 30 mL
  Filled 2016-08-08 (×36): qty 30

## 2016-08-08 MED ORDER — SODIUM CHLORIDE 0.9 % IV BOLUS (SEPSIS)
1000.0000 mL | Freq: Once | INTRAVENOUS | Status: AC
  Administered 2016-08-08: 1000 mL via INTRAVENOUS

## 2016-08-08 MED ORDER — ESCITALOPRAM OXALATE 10 MG PO TABS
10.0000 mg | ORAL_TABLET | Freq: Every day | ORAL | Status: DC
  Administered 2016-08-08 – 2016-08-22 (×14): 10 mg
  Filled 2016-08-08 (×15): qty 1

## 2016-08-08 MED ORDER — PANTOPRAZOLE SODIUM 40 MG PO PACK
40.0000 mg | PACK | Freq: Every day | ORAL | Status: DC
  Administered 2016-08-08 – 2016-08-22 (×14): 40 mg
  Filled 2016-08-08 (×16): qty 20

## 2016-08-08 MED ORDER — ACETAMINOPHEN 650 MG RE SUPP
650.0000 mg | Freq: Four times a day (QID) | RECTAL | Status: DC | PRN
  Administered 2016-08-12 – 2016-08-14 (×3): 650 mg via RECTAL
  Filled 2016-08-08 (×4): qty 1

## 2016-08-08 NOTE — Progress Notes (Signed)
Pharmacy Antibiotic Note  Katherine Palmer is a 55 y.o. female admitted on 08/08/2016 with intra-abdominal infection.  Pharmacy has been consulted for Zosyn dosing.  Plan: Zosyn 3.375g IV q8h (4 hour infusion).   Temp (24hrs), Avg:99.9 F (37.7 C), Min:99.9 F (37.7 C), Max:99.9 F (37.7 C)   Recent Labs Lab 08/08/16 0044 08/08/16 0058  WBC 14.6*  --   CREATININE 0.35*  --   LATICACIDVEN  --  1.14     No Known Allergies   Thank you for allowing pharmacy to be a part of this patient's care.  Vernard GamblesVeronda Esmirna Ravan, PharmD, BCPS  08/08/2016 5:57 AM

## 2016-08-08 NOTE — ED Notes (Signed)
No lab draw,  Pt enroute to inpatient. 

## 2016-08-08 NOTE — ED Triage Notes (Signed)
Per EMS:  [facility] hx of sepsis and MRSA infections.  Staff called out stating patient was more lethargic and confused than baseline.  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  Pt currently has a gastric tube and indwelling catheter.  Pt has had a left AKA.  Pt increased verbal response during transport with EMS.  Temp of 100.2 at facility.

## 2016-08-08 NOTE — H&P (Signed)
History and Physical    Carolynne Schuchard QQV:[id number] DOB: 1961-03-14 DOA: 08/08/2016  PCP: Kirt Boys, DO  Patient coming from: Nursing facility.  Chief Complaint: Confusion.  HPI: Katherine Palmer is a 55 y.o. female with history of CVA with left hemiparesis, with left AKA, stage IV decub ulcer with colostomy and PEG tube for feeding due to dysphagia, seizure disorder and HTN was brought to the ER from the skilled nursing facility after patient was found to be confused and encephalopathic. In the ER patient was found to have leukocytosis with fever. UA is compatible with UTI. On exam patient is only oriented to her name. No family at the bedside and baseline mental status not known.  Patient's sacral decubitus ulcer does not look infected.  Patient was admitted 2 weeks ago for hyperkalemia and was found to have UTI. At that time cultures grew Pseudomonas and was discharged on cefepime.  ED Course: Patient was started on antibiotics for UTI. Cultures were sent.  Review of Systems: As per HPI, rest all negative.   Past Medical History:  Diagnosis Date  . Decubitus ulcer of sacral region, stage 4 (HCC)   . Depression   . Dysphagia   . Dysphagia as late effect of cerebrovascular disease   . Hyperlipidemia   . Hypertension   . Seizures (HCC)   . Stroke [facility])     Past Surgical History:  Procedure Laterality Date  . 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 COLOSTOMY    . PEG TUBE PLACEMENT       reports that she has quit smoking. She has never used smokeless tobacco. She reports that she does not drink alcohol or use drugs.  No Known Allergies  Family History  Problem Relation Age of Onset  . Hypertension Other     Prior to Admission medications   Medication Sig Start Date End Date Taking? Authorizing Provider  acetaminophen (TYLENOL) 325 MG tablet Place 650 mg into feeding tube every 4 (four) hours as needed for mild pain.    Yes Historical Provider, MD  albuterol  (PROVENTIL) (2.5 MG/3ML) 0.083% nebulizer solution Take 2.5 mg by nebulization every 4 (four) hours as needed for wheezing or shortness of breath.   Yes Historical Provider, MD  Amino Acids-Protein Hydrolys (FEEDING SUPPLEMENT, PRO-STAT SUGAR FREE 64,) LIQD Place 30 mLs into feeding tube 3 (three) times daily with meals.   Yes Historical Provider, MD  ascorbic acid (VITAMIN C) 500 MG tablet Take 500 mg by mouth daily.   Yes Historical Provider, MD  aspirin EC 81 MG tablet Take 81 mg by mouth daily. Via G-Tube    Yes Historical Provider, MD  bisacodyl (DULCOLAX) 10 MG suppository Place 10 mg rectally daily as needed for moderate constipation.   Yes Historical Provider, MD  cloNIDine (CATAPRES) 0.1 MG tablet Place 0.1 mg into feeding tube every 6 (six) hours as needed (blood pressure).    Yes Historical Provider, MD  collagenase (SANTYL) ointment Apply 1 application topically daily.   Yes Historical Provider, MD  escitalopram (LEXAPRO) 5 MG tablet Place 10 mg into feeding tube daily.   Yes Historical Provider, MD  ipratropium-albuterol (DUONEB) 0.5-2.5 (3) MG/3ML SOLN Take 3 mLs by nebulization every 6 (six) hours as needed (sob).    Yes Historical Provider, MD  LORazepam (ATIVAN) 0.5 MG tablet Place 1 tablet (0.5 mg total) into feeding tube every 8 (eight) hours as needed for anxiety. 07/22/16  Yes Meredeth Ide, MD  magnesium hydroxide (MILK  OF MAGNESIA) 400 MG/5ML suspension Place 30 mLs into feeding tube daily as needed for mild constipation.    Yes Historical Provider, MD  metoprolol tartrate (LOPRESSOR) 25 MG tablet Take 1 tablet (25 mg total) by mouth 2 (two) times daily. Patient taking differently: Place 25 mg into feeding tube 2 (two) times daily.  07/22/16  Yes Meredeth IdeGagan S Lama, MD  Multiple Vitamins-Minerals (DECUBI-VITE) CAPS Take 1 capsule by mouth daily.   Yes Historical Provider, MD  Nutritional Supplements (FEEDING SUPPLEMENT, OSMOLITE 1.5 CAL,) LIQD Place 1,000 mLs into feeding tube continuous.  05/21/16  Yes Jeralyn BennettEzequiel Zamora, MD  OxyCODONE HCl, Abuse Deter, (OXAYDO) 5 MG TABA Take 5 mg by mouth every 6 (six) hours as needed (pain). 07/22/16  Yes Meredeth IdeGagan S Lama, MD  pantoprazole sodium (PROTONIX) 40 mg/20 mL PACK Place 40 mg into feeding tube daily.   Yes Historical Provider, MD  polyethylene glycol (MIRALAX / GLYCOLAX) packet Place 17 g into feeding tube daily.   Yes Historical Provider, MD  pravastatin (PRAVACHOL) 40 MG tablet Place 40 mg into feeding tube daily.   Yes Historical Provider, MD  Prenatal Ca Carb-B6-B12-FA (FOLBECAL) 1 MG TABS Give 1 tablet by tube daily.   Yes Historical Provider, MD  UNABLE TO FIND Med Name:House Supplement with meals for caloric support and weight loss nutritional treat 4 oz three times daily at meals   Yes Historical Provider, MD  Valproate Sodium (DEPAKENE PO) Give 200 mg by tube every 8 (eight) hours.   Yes Historical Provider, MD  Zinc 100 MG TABS Take 200 mg by mouth daily.   Yes Historical Provider, MD    Physical Exam: Vitals:   08/08/16 0245 08/08/16 0300 08/08/16 0330 08/08/16 0345  BP: 126/72 125/69 118/73 124/75  Pulse: 87 88 88 88  Resp: 17 17 15 18   Temp:      TempSrc:      SpO2: 99% 98% 98% 99%      Constitutional: Moderately built and nourished. Vitals:   08/08/16 0245 08/08/16 0300 08/08/16 0330 08/08/16 0345  BP: 126/72 125/69 118/73 124/75  Pulse: 87 88 88 88  Resp: 17 17 15 18   Temp:      TempSrc:      SpO2: 99% 98% 98% 99%   Eyes: Anicteric. no pallor. ENMT: No discharge from the ears eyes nose or mouth. Neck: No neck rigidity. No mass felt. Respiratory: No rhonchi or crepitations. Cardiovascular: S1-S2 heard. Abdomen: Colostomy bag. Soft. Musculoskeletal: Left AKA. Skin: Sacral decubitus ulcer stage IV not infected. Neurologic: Appears confused only oriented to her name. Psychiatric: Appears confused.   Labs on Admission: I have personally reviewed following labs and imaging studies  CBC:  Recent Labs Lab  08/08/16 0044  WBC 14.6*  NEUTROABS 9.1*  HGB 10.0*  HCT 35.0*  MCV 103.6*  PLT 304   Basic Metabolic Panel:  Recent Labs Lab 08/08/16 0044  NA 143  K 4.7  CL 111  CO2 26  GLUCOSE 90  BUN 34*  CREATININE 0.35*  CALCIUM 8.2*   GFR: CrCl cannot be calculated (Unknown ideal weight.). Liver Function Tests:  Recent Labs Lab 08/08/16 0044  AST 24  ALT 15  ALKPHOS 86  BILITOT 0.4  PROT 6.9  ALBUMIN 1.5*   No results for input(s): LIPASE, AMYLASE in the last 168 hours. No results for input(s): AMMONIA in the last 168 hours. Coagulation Profile: No results for input(s): INR, PROTIME in the last 168 hours. Cardiac Enzymes: No results for input(s):  CKTOTAL, CKMB, CKMBINDEX, TROPONINI in the last 168 hours. BNP (last 3 results) No results for input(s): PROBNP in the last 8760 hours. HbA1C: No results for input(s): HGBA1C in the last 72 hours. CBG: No results for input(s): GLUCAP in the last 168 hours. Lipid Profile: No results for input(s): CHOL, HDL, LDLCALC, TRIG, CHOLHDL, LDLDIRECT in the last 72 hours. Thyroid Function Tests: No results for input(s): TSH, T4TOTAL, FREET4, T3FREE, THYROIDAB in the last 72 hours. Anemia Panel: No results for input(s): VITAMINB12, FOLATE, FERRITIN, TIBC, IRON, RETICCTPCT in the last 72 hours. Urine analysis:    Component Value Date/Time   COLORURINE AMBER (A) 08/08/2016 0115   APPEARANCEUR CLOUDY (A) 08/08/2016 0115   LABSPEC 1.024 08/08/2016 0115   PHURINE 5.5 08/08/2016 0115   GLUCOSEU NEGATIVE 08/08/2016 0115   HGBUR MODERATE (A) 08/08/2016 0115   BILIRUBINUR NEGATIVE 08/08/2016 0115   KETONESUR NEGATIVE 08/08/2016 0115   PROTEINUR 30 (A) 08/08/2016 0115   NITRITE POSITIVE (A) 08/08/2016 0115   LEUKOCYTESUR MODERATE (A) 08/08/2016 0115   Sepsis Labs: @LABRCNTIP (procalcitonin:4,lacticidven:4) )No results found for this or any previous visit (from the past 240 hour(s)).   Radiological Exams on Admission: Dg Chest Port  1 View  Result Date: 08/08/2016 CLINICAL DATA:  Sepsis. Altered mental status and lethargy tonight. History of strokes. EXAM: PORTABLE CHEST 1 VIEW COMPARISON:  05/16/2016 FINDINGS: Postoperative changes in the thoracic spine. Shallow inspiration. Cardiac enlargement. Mild pulmonary vascular congestion. Linear atelectasis in the mid lungs bilaterally. No edema or consolidation. Costophrenic angles are obscured and can't exclude small pleural effusions. No pneumothorax. Calcified aorta. Old right rib fractures. IMPRESSION: Cardiac enlargement with pulmonary vascular congestion. No edema or consolidation. Linear atelectasis in the mid lungs. Electronically Signed   By: Burman NievesWilliam  Stevens M.D.   On: 08/08/2016 00:52     Assessment/Plan Principal Problem:   Acute encephalopathy Active Problems:   Hemiparesis affecting left side as late effect of cerebrovascular accident (CVA) (HCC)   Essential hypertension, benign   Dysphagia due to old cerebrovascular accident   S/P AKA (above knee amputation) (HCC)   Stage IV decubitus ulcer (HCC)   Urinary tract infection associated with indwelling urethral catheter (HCC)   Altered mental status    1. Acute encephalopathy most likely secondary to UTI - for now I have placed patient on Zosyn since patient's recent urine culture shows Pseudomonas which was sensitive to Zosyn. Follow cultures. Patient's sacral decubitus ulcer does not look infected. Since patient is also on Depakote I have ordered Depakote levels and ammonia levels. Will check EEG since charts mention history of seizure. Since patient is following commands I don't think patient has active seizures. I have was from a seated does that Depakene. 2. History of CVA with left-sided hemiparesis on PEG tube feeds due to dysphagia - continue present medications including aspirin, statins and PEG tube feeds. 3. Stage IV sacral decubitus ulcer with diverting colostomy - does not look infected. 4. Chronic  anemia - follow CBC. 5. Hypertension - on metoprolol and I placed patient on when necessary IV hydralazine. 6. Left AKA.   DVT prophylaxis: Lovenox. Code Status: Full code.  Family Communication: No family at the bedside.  Disposition Plan: Back to facility.  Consults called: Wound team.  Admission status: Inpatient. Likely stay 2-3 days.    Eduard ClosKAKRAKANDY,Letty Salvi N. MD [facility] Pager [phone].  If 7PM-7AM, please contact night-coverage [email or web address] Password [email or web address]  08/08/2016, 5:53 AM

## 2016-08-08 NOTE — Progress Notes (Signed)
Pt. With temp of 101.7, Tylenol 650 mg via tube given and text paged Dr. Jerral RalphGhimire.  Return call and no new orders at this time.  Will continue to monitor.  [employer] Cellarelcine Ronesha Heenan, RN

## 2016-08-08 NOTE — Progress Notes (Signed)
PROGRESS NOTE        PATIENT DETAILS Name: Katherine Palmer Age: 55 y.o. Sex: female Date of Birth: [birth year] Admit Date: 08/08/2016 Admitting Physician Eduard Clos, MD ZOX:[id number], Maxine Glenn, DO  Brief Narrative: Patient is a 55 y.o. female with history of CVA with left hemiparesis, with left AKA, stage IV decub ulcer with colostomy and PEG tube for feeding due to dysphagia, seizure disorder and HTN was brought to the ER from the skilled nursing facility after patient was found to have altered mental status. She was thought to be encephalopathic from a UTI. She was subsequently admitted for further evaluation and treatment.   Subjective: She is awake-able to tell me her name, follows some commands. She appears to be dysarthric from a prior CVA. No family members at bedside during my rounds. Not sure what her usual baseline is.  Assessment/Plan: Acute metabolic encephalopathy: Suspect likely due to UTI-per H&P sacral decubitus that does not look infected-for now continue with empiric antibiotics as she seems somewhat improved than on her initial presentation. Follow clinical course and await culture data.  Systemic inflammatory response syndrome: Likely secondary to UTI, continue antimicrobial agents-await culture data.  UTI due to chronic indwelling Foley catheter: Continue Fortaz, await culture data.  Chronic sacral decubitus ulcer stage IV:-present prior to admission-per H&P-but this does not look infected-I will await wound care evaluation, if fever continues, may need a more thorough examination with imaging of this area. Has a diverting colostomy in place.  History of CVA with chronic left-sided hemiplegia, dysarthria and dysphagia: PEG tube in place, continue aspirin and statin.   Hypertension: Continue with metoprolol.  History of seizures: Continue Depakote  S/P AKA (above knee amputation)   DVT Prophylaxis: Prophylactic Lovenox   Code  Status: Full code   Family Communication: None at bedside  Disposition Plan: Remain patient- back to SNF on discharge  Antimicrobial agents: See below  Procedures: None  CONSULTS:  None  Time spent: 25 minutes-Greater than 50% of this time was spent in counseling, explanation of diagnosis, planning of further management, and coordination of care.  MEDICATIONS: Anti-infectives    Start     Dose/Rate Route Frequency Ordered Stop   08/08/16 1400  cefTAZidime (FORTAZ) 1 g in dextrose 5 % 50 mL IVPB     1 g 100 mL/hr over 30 Minutes Intravenous Every 12 hours 08/08/16 1322     08/08/16 0315  cefTRIAXone (ROCEPHIN) 2 g in dextrose 5 % 50 mL IVPB     2 g 100 mL/hr over 30 Minutes Intravenous  Once 08/08/16 0308 08/08/16 0350      Scheduled Meds: . aspirin  81 mg Oral Daily  . cefTAZidime (FORTAZ)  IV  1 g Intravenous Q12H  . enoxaparin (LOVENOX) injection  40 mg Subcutaneous Q24H  . escitalopram  10 mg Per Tube Daily  . feeding supplement (PRO-STAT SUGAR FREE 64)  30 mL Per Tube TID WC  . folic acid-pyridoxine-cyancobalamin  1 tablet Per Tube Daily  . metoprolol tartrate  25 mg Per Tube BID  . multivitamin  1 tablet Oral Daily  . pantoprazole sodium  40 mg Per Tube Daily  . polyethylene glycol  17 g Per Tube Daily  . pravastatin  40 mg Per Tube Daily  . Valproate Sodium  200 mg Per Tube Q8H  . ascorbic acid  500 mg  Oral Daily  . zinc sulfate  220 mg Oral Daily   Continuous Infusions: . sodium chloride 75 mL/hr at 08/08/16 0621  . feeding supplement (OSMOLITE 1.5 CAL) 1,000 mL (08/08/16 0656)   PRN Meds:.acetaminophen **OR** acetaminophen, albuterol, bisacodyl, hydrALAZINE, ipratropium-albuterol, LORazepam, ondansetron **OR** ondansetron (ZOFRAN) IV, oxyCODONE   PHYSICAL EXAM: Vital signs: Vitals:   08/08/16 0345 08/08/16 0621 08/08/16 1128 08/08/16 1323  BP: 124/75 136/80 126/81 100/79  Pulse: 88 88 (!) 102 79  Resp: 18 18 18 18   Temp:  98.4 F (36.9 C) 98.3  F (36.8 C) (!) 100.9 F (38.3 C)  TempSrc:  Oral Oral Oral  SpO2: 99% 98% 98% 99%   There were no vitals filed for this visit. There is no height or weight on file to calculate BMI.   General appearance:Chronically ill-appearing female-she looks much older than her stated age. She is awake, but very dysarthric. Follows some commands. She is not in any acute distress.  Eyes:, pupils equally reactive to light and accomodation,no scleral icterus.Pink conjunctiva HEENT: Atraumatic and Normocephalic Neck: supple, no JVD. Resp:Good air entry bilaterally, no added sounds  CVS: S1 S2 regular, no murmurs.  GI: Bowel sounds present, Non tender and not distended with no gaurding, rigidity or rebound.No organomegaly. PEG tube site without any excessive discharge. Colostomy in place with stool in the bag. Extremities: Left AKA, right lower extremity without any edema  Neurology: Chronically dysarthric, left hemiplegia at baseline.   I have personally reviewed following labs and imaging studies  LABORATORY DATA: CBC:  Recent Labs Lab 08/08/16 0044 08/08/16 0626  WBC 14.6* 12.8*  NEUTROABS 9.1* 8.8*  HGB 10.0* 9.7*  HCT 35.0* 34.3*  MCV 103.6* 104.3*  PLT 304 283    Basic Metabolic Panel:  Recent Labs Lab 08/08/16 0044 08/08/16 0626  NA 143 146*  K 4.7 4.6  CL 111 113*  CO2 26 29  GLUCOSE 90 92  BUN 34* 30*  CREATININE 0.35* 0.33*  CALCIUM 8.2* 8.3*    GFR: CrCl cannot be calculated (Unknown ideal weight.).  Liver Function Tests:  Recent Labs Lab 08/08/16 0044 08/08/16 0626  AST 24 19  ALT 15 13*  ALKPHOS 86 78  BILITOT 0.4 0.3  PROT 6.9 6.3*  ALBUMIN 1.5* 1.4*   No results for input(s): LIPASE, AMYLASE in the last 168 hours.  Recent Labs Lab 08/08/16 0626  AMMONIA 26    Coagulation Profile: No results for input(s): INR, PROTIME in the last 168 hours.  Cardiac Enzymes: No results for input(s): CKTOTAL, CKMB, CKMBINDEX, TROPONINI in the last 168  hours.  BNP (last 3 results) No results for input(s): PROBNP in the last 8760 hours.  HbA1C: No results for input(s): HGBA1C in the last 72 hours.  CBG:  Recent Labs Lab 08/08/16 0613 08/08/16 1326  GLUCAP 89 130*    Lipid Profile: No results for input(s): CHOL, HDL, LDLCALC, TRIG, CHOLHDL, LDLDIRECT in the last 72 hours.  Thyroid Function Tests: No results for input(s): TSH, T4TOTAL, FREET4, T3FREE, THYROIDAB in the last 72 hours.  Anemia Panel: No results for input(s): VITAMINB12, FOLATE, FERRITIN, TIBC, IRON, RETICCTPCT in the last 72 hours.  Urine analysis:    Component Value Date/Time   COLORURINE AMBER (A) 08/08/2016 0115   APPEARANCEUR CLOUDY (A) 08/08/2016 0115   LABSPEC 1.024 08/08/2016 0115   PHURINE 5.5 08/08/2016 0115   GLUCOSEU NEGATIVE 08/08/2016 0115   HGBUR MODERATE (A) 08/08/2016 0115   BILIRUBINUR NEGATIVE 08/08/2016 0115   KETONESUR NEGATIVE 08/08/2016  0115   PROTEINUR 30 (A) 08/08/2016 0115   NITRITE POSITIVE (A) 08/08/2016 0115   LEUKOCYTESUR MODERATE (A) 08/08/2016 0115    Sepsis Labs: Lactic Acid, Venous    Component Value Date/Time   LATICACIDVEN 1.14 08/08/2016 0058    MICROBIOLOGY: No results found for this or any previous visit (from the past 240 hour(s)).  RADIOLOGY STUDIES/RESULTS: Dg Chest Port 1 View  Result Date: 08/08/2016 CLINICAL DATA:  Sepsis. Altered mental status and lethargy tonight. History of strokes. EXAM: PORTABLE CHEST 1 VIEW COMPARISON:  05/16/2016 FINDINGS: Postoperative changes in the thoracic spine. Shallow inspiration. Cardiac enlargement. Mild pulmonary vascular congestion. Linear atelectasis in the mid lungs bilaterally. No edema or consolidation. Costophrenic angles are obscured and can't exclude small pleural effusions. No pneumothorax. Calcified aorta. Old right rib fractures. IMPRESSION: Cardiac enlargement with pulmonary vascular congestion. No edema or consolidation. Linear atelectasis in the mid lungs.  Electronically Signed   By: Burman NievesWilliam  Stevens M.D.   On: 08/08/2016 00:52     LOS: 0 days   Jeoffrey MassedGHIMIRE,Jon Lall, MD  [facility] Pager:[phone]  If 7PM-7AM, please contact night-coverage [email or web address] Password [email or web address] 08/08/2016, 1:37 PM

## 2016-08-08 NOTE — Progress Notes (Signed)
Sepsis alert fired, text paged Dr. Jerral RalphGhimire to let him know.  Will continue to monitor and await for return call.  [employer] Cellarelcine Jamirra Curnow, RN

## 2016-08-08 NOTE — Progress Notes (Signed)
Pharmacy Antibiotic Note  Katherine Palmer is a 55 y.o. female admitted on 08/08/2016 with encephalopathy and suspected UTI.  Pharmacy has been consulted for Ceftazidime dosing. WBC improved to 12.8 and temp 100.9. Lactic acid slightly elevated at 1.14. Estimated CrCl ~ 45 mL/min. Of note, patient has left AKA and is non-ambulatory.   Plan: Ceftazidime 1g IV q12hr Monitor renal function, cx results, clinical picture F/u duration of therapy   Antimicrobials this admission:  11/23 CTX>> 11/23 11/23 ceftaz >>   Dose adjustments this admission:    Microbiology results:  11/23 urine cx >>  11/23 blood cx >>   Previous Admission: 11/3 (old) urine cx >> 40,000 pseudomonas (R to cipro and imipenem) 10/31-11/4 CTX > 11/4-11/6 cefepime  10/31-11/2 Vanc   Temp (24hrs), Avg:98.9 F (37.2 C), Min:98.3 F (36.8 C), Max:99.9 F (37.7 C)   Recent Labs Lab 08/08/16 0044 08/08/16 0058 08/08/16 0626  WBC 14.6*  --  12.8*  CREATININE 0.35*  --  0.33*  LATICACIDVEN  --  1.14  --     No Known Allergies  York CeriseKatherine Cook, PharmD Pharmacy Resident  Pager [phone] 08/08/16 1:17 PM

## 2016-08-08 NOTE — ED Provider Notes (Signed)
[facility]-EMERGENCY DEPT Provider Note   CSN: [id number] Arrival date & time: 08/08/16  0017  By signing my name below, I, Freida Busmaniana Omoyeni, attest that this documentation has been prepared under the direction and in the presence of Tomasita CrumbleAdeleke Cassidey Barrales, MD . Electronically Signed: Freida Busmaniana Omoyeni, Scribe. 08/08/2016. 12:50 AM.    History   Chief Complaint Chief Complaint  Patient presents with  . Altered Mental Status   LEVEL 5 CAVEAT DUE TO MENTAL STATUS   The history is provided by the EMS personnel. No language interpreter was used.     HPI Comments:  Katherine Palmer is a 55 y.o. female with a h/o CVA x 4, and hemiplegia, who presents to the Emergency Department [facility]. Nursing home staff reported to EMS that pt was more lethargic and confused than baseline. Pt also with a fever of 100.2 at the NH.    Past Medical History:  Diagnosis Date  . Decubitus ulcer of sacral region, stage 4 (HCC)   . Depression   . Dysphagia   . Dysphagia as late effect of cerebrovascular disease   . Hyperlipidemia   . Hypertension   . Seizures (HCC)   . Stroke [facility](HCC)     Patient Active Problem List   Diagnosis Date Noted  . UTI (urinary tract infection) 07/18/2016  . Pressure injury of skin 07/17/2016  . Acute hyperkalemia 07/16/2016  . Protein-calorie malnutrition, severe 05/21/2016  . Stage IV decubitus ulcer (HCC) 05/18/2016  . Cerebral infarction (HCC)   . Hx of AKA (above knee amputation) (HCC)   . Hemiparesis affecting left side as late effect of cerebrovascular accident (CVA) (HCC) 04/11/2016  . Essential hypertension, benign 04/11/2016  . Failure to thrive syndrome, adult 04/11/2016  . Dysphagia due to old cerebrovascular accident 04/11/2016  . Depression with anxiety 04/11/2016  . Colostomy present (HCC) 04/11/2016  . S/P AKA (above knee amputation) (HCC) 04/11/2016  . Anemia of chronic disease 04/11/2016    Past Surgical History:  Procedure  Laterality Date  . 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 COLOSTOMY    . PEG TUBE PLACEMENT      OB History    No data available       Home Medications    Prior to Admission medications   Medication Sig Start Date End Date Taking? Authorizing Provider  acetaminophen (TYLENOL) 325 MG tablet Place 650 mg into feeding tube every 4 (four) hours as needed for mild pain.    Yes Historical Provider, MD  albuterol (PROVENTIL) (2.5 MG/3ML) 0.083% nebulizer solution Take 2.5 mg by nebulization every 4 (four) hours as needed for wheezing or shortness of breath.   Yes Historical Provider, MD  Amino Acids-Protein Hydrolys (FEEDING SUPPLEMENT, PRO-STAT SUGAR FREE 64,) LIQD Place 30 mLs into feeding tube 3 (three) times daily with meals.   Yes Historical Provider, MD  ascorbic acid (VITAMIN C) 500 MG tablet Take 500 mg by mouth daily.   Yes Historical Provider, MD  aspirin EC 81 MG tablet Take 81 mg by mouth daily. Via G-Tube    Yes Historical Provider, MD  bisacodyl (DULCOLAX) 10 MG suppository Place 10 mg rectally daily as needed for moderate constipation.   Yes Historical Provider, MD  cloNIDine (CATAPRES) 0.1 MG tablet Place 0.1 mg into feeding tube every 6 (six) hours as needed (blood pressure).    Yes Historical Provider, MD  collagenase (SANTYL) ointment Apply 1 application topically daily.   Yes Historical Provider, MD  escitalopram (LEXAPRO) 5 MG tablet Place 10 mg into feeding tube daily.   Yes Historical Provider, MD  ipratropium-albuterol (DUONEB) 0.5-2.5 (3) MG/3ML SOLN Take 3 mLs by nebulization every 6 (six) hours as needed (sob).    Yes Historical Provider, MD  LORazepam (ATIVAN) 0.5 MG tablet Place 1 tablet (0.5 mg total) into feeding tube every 8 (eight) hours as needed for anxiety. 07/22/16  Yes Meredeth IdeGagan S Lama, MD  magnesium hydroxide (MILK OF MAGNESIA) 400 MG/5ML suspension Place 30 mLs into feeding tube daily as needed for mild constipation.    Yes Historical Provider, MD  metoprolol  tartrate (LOPRESSOR) 25 MG tablet Take 1 tablet (25 mg total) by mouth 2 (two) times daily. Patient taking differently: Place 25 mg into feeding tube 2 (two) times daily.  07/22/16  Yes Meredeth IdeGagan S Lama, MD  Multiple Vitamins-Minerals (DECUBI-VITE) CAPS Take 1 capsule by mouth daily.   Yes Historical Provider, MD  Nutritional Supplements (FEEDING SUPPLEMENT, OSMOLITE 1.5 CAL,) LIQD Place 1,000 mLs into feeding tube continuous. 05/21/16  Yes Jeralyn BennettEzequiel Zamora, MD  OxyCODONE HCl, Abuse Deter, (OXAYDO) 5 MG TABA Take 5 mg by mouth every 6 (six) hours as needed (pain). 07/22/16  Yes Meredeth IdeGagan S Lama, MD  pantoprazole sodium (PROTONIX) 40 mg/20 mL PACK Place 40 mg into feeding tube daily.   Yes Historical Provider, MD  polyethylene glycol (MIRALAX / GLYCOLAX) packet Place 17 g into feeding tube daily.   Yes Historical Provider, MD  pravastatin (PRAVACHOL) 40 MG tablet Place 40 mg into feeding tube daily.   Yes Historical Provider, MD  Prenatal Ca Carb-B6-B12-FA (FOLBECAL) 1 MG TABS Give 1 tablet by tube daily.   Yes Historical Provider, MD  UNABLE TO FIND Med Name:House Supplement with meals for caloric support and weight loss nutritional treat 4 oz three times daily at meals   Yes Historical Provider, MD  Valproate Sodium (DEPAKENE PO) Give 200 mg by tube every 8 (eight) hours.   Yes Historical Provider, MD  Zinc 100 MG TABS Take 200 mg by mouth daily.   Yes Historical Provider, MD    Family History Family History  Problem Relation Age of Onset  . Family history unknown: Yes    Social History Social History  Substance Use Topics  . Smoking status: Former Games developermoker  . Smokeless tobacco: Never Used  . Alcohol use No     Allergies   Patient has no known allergies.   Review of Systems Review of Systems  Unable to perform ROS: Mental status change     Physical Exam Updated Vital Signs BP 103/67   Pulse 85   Temp 99.9 F (37.7 C) (Rectal)   Resp 16   SpO2 99%   Physical Exam  Constitutional:  She appears well-developed and well-nourished. No distress.  HENT:  Head: Normocephalic and atraumatic.  Nose: Nose normal.  Mouth/Throat: Oropharynx is clear and moist. Mucous membranes are dry. No oropharyngeal exudate.  Eyes: No scleral icterus.  Neck: Neck supple. No JVD present. No tracheal deviation present. No thyromegaly present.  Cardiovascular: Normal rate, regular rhythm and normal heart sounds.  Exam reveals no gallop and no friction rub.   No murmur heard. Pulmonary/Chest: Effort normal and breath sounds normal. No respiratory distress. She has no wheezes. She exhibits no tenderness.  [place] in place  Abdominal: Soft. Bowel sounds are normal. She exhibits no distension and no mass. There is no tenderness. There is no rebound and no guarding.  Peg Tube LUQ Colostomy RLQ  Musculoskeletal:  She exhibits no edema.  Moves all extremities  Lymphadenopathy:    She has no cervical adenopathy.  Neurological: She is alert. No cranial nerve deficit. She exhibits normal muscle tone.  Skin: Skin is warm and dry. No rash noted. No pallor.  Stage 2 ulcer to right hip   Nursing note and vitals reviewed.    ED Treatments / Results  DIAGNOSTIC STUDIES:  Oxygen Saturation is 100% on [place], normal by my interpretation.     Labs (all labs ordered are listed, but only abnormal results are displayed) Labs Reviewed  COMPREHENSIVE METABOLIC PANEL - Abnormal; Notable for the following:       Result Value   BUN 34 (*)    Creatinine, Ser 0.35 (*)    Calcium 8.2 (*)    Albumin 1.5 (*)    All other components within normal limits  CBC WITH DIFFERENTIAL/PLATELET - Abnormal; Notable for the following:    WBC 14.6 (*)    RBC 3.38 (*)    Hemoglobin 10.0 (*)    HCT 35.0 (*)    MCV 103.6 (*)    MCHC 28.6 (*)    RDW 23.6 (*)    Neutro Abs 9.1 (*)    Monocytes Absolute 2.6 (*)    All other components within normal limits  URINALYSIS, ROUTINE W REFLEX MICROSCOPIC (NOT AT [facility]) - Abnormal; Notable  for the following:    Color, Urine AMBER (*)    APPearance CLOUDY (*)    Hgb urine dipstick MODERATE (*)    Protein, ur 30 (*)    Nitrite POSITIVE (*)    Leukocytes, UA MODERATE (*)    All other components within normal limits  URINE MICROSCOPIC-ADD ON - Abnormal; Notable for the following:    Squamous Epithelial / LPF 0-5 (*)    Bacteria, UA FEW (*)    Casts GRANULAR CAST (*)    All other components within normal limits  CULTURE, BLOOD (ROUTINE X 2)  CULTURE, BLOOD (ROUTINE X 2)  URINE CULTURE  I-STAT CG4 LACTIC ACID, ED    EKG  EKG Interpretation  Date/Time:  Thursday August 08 2016 00:24:50 EST Ventricular Rate:  91 PR Interval:    QRS Duration: 143 QT Interval:  378 QTC Calculation: 466 R Axis:   -55 Text Interpretation:  Sinus or ectopic atrial rhythm Paired ventricular premature complexes Short PR interval Right bundle branch block ectopic atrial complexes are new Confirmed by Erroll Luna ([id number]) on 08/08/2016 12:55:54 AM       Radiology Dg Chest Port 1 View  Result Date: 08/08/2016 CLINICAL DATA:  Sepsis. Altered mental status and lethargy tonight. History of strokes. EXAM: PORTABLE CHEST 1 VIEW COMPARISON:  05/16/2016 FINDINGS: Postoperative changes in the thoracic spine. Shallow inspiration. Cardiac enlargement. Mild pulmonary vascular congestion. Linear atelectasis in the mid lungs bilaterally. No edema or consolidation. Costophrenic angles are obscured and can't exclude small pleural effusions. No pneumothorax. Calcified aorta. Old right rib fractures. IMPRESSION: Cardiac enlargement with pulmonary vascular congestion. No edema or consolidation. Linear atelectasis in the mid lungs. Electronically Signed   By: Burman Nieves M.D.   On: 08/08/2016 00:52    Procedures Procedures (including critical care time)  Medications Ordered in ED Medications  sodium chloride 0.9 % bolus 1,000 mL (1,000 mLs Intravenous New Bag/Given 08/08/16 0115)      Initial Impression / Assessment and Plan / ED Course  I have reviewed the triage vital signs and the nursing notes.  Pertinent labs & imaging results  that were available during my care of the patient were reviewed by me and considered in my medical decision making (see chart for details).  Clinical Course     Patient presents to the ED for AMS and low grade fever.  Staff states she was more lethargic and confused.  Labs show a possible UTI.  She has mild hypoxia here as well requiring [place].  No pneumonia on CXR.  Will admit to hospitalist for AMS and further care.  Final Clinical Impressions(s) / ED Diagnoses   Final diagnoses:  None    New Prescriptions New Prescriptions   No medications on file     I personally performed the services described in this documentation, which was scribed in my presence. The recorded information has been reviewed and is accurate.       Tomasita Crumble, MD 08/08/16 1343

## 2016-08-08 NOTE — ED Notes (Signed)
Delay in lab draw,  exray in room at this time.

## 2016-08-08 NOTE — Progress Notes (Signed)
Unable to preform EEG. Pt has extremely matted hair and is unable to get to the scalp. Notified nurse. Pt will have to have hair washed or cut if EEG is still warrented.

## 2016-08-09 DIAGNOSIS — R6510 Systemic inflammatory response syndrome (SIRS) of non-infectious origin without acute organ dysfunction: Secondary | ICD-10-CM

## 2016-08-09 LAB — BASIC METABOLIC PANEL
ANION GAP: 8 (ref 5–15)
BUN: 27 mg/dL — ABNORMAL HIGH (ref 6–20)
CHLORIDE: 111 mmol/L (ref 101–111)
CO2: 26 mmol/L (ref 22–32)
Calcium: 8.3 mg/dL — ABNORMAL LOW (ref 8.9–10.3)
Creatinine, Ser: 0.33 mg/dL — ABNORMAL LOW (ref 0.44–1.00)
GFR calc non Af Amer: >60 mL/min (ref >60)
Glucose, Bld: 119 mg/dL — ABNORMAL HIGH (ref 65–99)
Potassium: 4.4 mmol/L (ref 3.5–5.1)
SODIUM: 145 mmol/L (ref 135–145)

## 2016-08-09 LAB — CBC
HEMATOCRIT: 36.2 % (ref 36.0–46.0)
HEMOGLOBIN: 10.3 g/dL — AB (ref 12.0–15.0)
MCH: 28.9 pg (ref 26.0–34.0)
MCHC: 28.5 g/dL — ABNORMAL LOW (ref 30.0–36.0)
MCV: 101.7 fL — ABNORMAL HIGH (ref 78.0–100.0)
Platelets: 302 10*3/uL (ref 150–400)
RBC: 3.56 MIL/uL — AB (ref 3.87–5.11)
RDW: 22.6 % — ABNORMAL HIGH (ref 11.5–15.5)
WBC: 11 10*3/uL — AB (ref 4.0–10.5)

## 2016-08-09 LAB — GLUCOSE, CAPILLARY
GLUCOSE-CAPILLARY: 123 mg/dL — AB (ref 65–99)
GLUCOSE-CAPILLARY: 125 mg/dL — AB (ref 65–99)
Glucose-Capillary: 123 mg/dL — ABNORMAL HIGH (ref 65–99)

## 2016-08-09 MED ORDER — ORAL CARE MOUTH RINSE
15.0000 mL | Freq: Two times a day (BID) | OROMUCOSAL | Status: DC
  Administered 2016-08-09 – 2016-08-21 (×23): 15 mL via OROMUCOSAL

## 2016-08-09 MED ORDER — CHLORHEXIDINE GLUCONATE 0.12 % MT SOLN
15.0000 mL | Freq: Two times a day (BID) | OROMUCOSAL | Status: DC
  Administered 2016-08-09 – 2016-08-22 (×27): 15 mL via OROMUCOSAL
  Filled 2016-08-09 (×25): qty 15

## 2016-08-09 NOTE — Consult Note (Addendum)
WOC consult: Reason for Consult:  [facility]. 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  Air mattress to decrease pressure.   Middle back with full thickness wound; .8X.8X.2cm, red and moist, small amt yellow drainage, no odor. Foam dressing to absorb drainage and promote healing. No family members present to discuss plan of care.  WOC Nurse ostomy follow up Stoma type/location: RLQ ileostomy Stomal assessment/size: 1 and 3/4 inch round, red, moist double-barreled stoma, slightly above skin level.  Current pouch is leaking. Peristomal assessment: intact Output Mod amt liquid brown stool Ostomy pouching:Applied  2 piece 2 and 1/4 inch pouching system applied and extra supplies at the bedside for staff nurse use. Please re-consult if further assistance is needed.  Thank-you,  Cammie Mcgeeawn Arista Kettlewell MSN, RN, CWOCN, [place]CN-AP, CNS [phone]

## 2016-08-09 NOTE — Progress Notes (Signed)
PROGRESS NOTE        PATIENT DETAILS Name: Katherine AhmadiDiane Janusz Age: 55 y.o. Sex: female Date of Birth: Feb 15, 1961 Admit Date: 08/08/2016 Admitting Physician Eduard ClosArshad N Kakrakandy, MD WUJ:[id number]:Carter, Maxine GlennMonica, DO  Brief Narrative: Patient is a 55 y.o. female with history of CVA with left hemiparesis, with left AKA, stage IV decub ulcer with colostomy and PEG tube for feeding due to dysphagia, seizure disorder and HTN was brought to the ER from the skilled nursing facility after patient was found to have altered mental status. She was thought to be encephalopathic from a UTI. She was subsequently admitted for further evaluation and treatment.   Subjective: She is awake-follows some commands. Febrile overnight.  Assessment/Plan: Acute metabolic encephalopathy: Much improved. Suspect likely due to UTI-sacral decubitus ulcer does not appear infected-see pictures below. Continue empiric antimicrobial agents, and await urine cultures. Blood cultures are negative so far.   Systemic inflammatory response syndrome: Likely secondary to UTI, continue antimicrobial and await urine cultures.  UTI due to chronic indwelling Foley catheter: Continue Fortaz, await urine culture results-preliminary result shows more than 100,000 colonies of gram-negative rods.  Chronic sacral decubitus ulcer stage IV:-present prior to admission-see pictures below-do not see any obvious infection at this time. However if fever continues in spite of being on appropriate antimicrobial agents for UTI, may need further imaging. Await wound care evaluation. Note has a diverting colostomy in place.   History of CVA with chronic left-sided hemiplegia, dysarthria and dysphagia: PEG tube in place, continue aspirin and statin.   Hypertension: Continue with metoprolol.  History of seizures: Continue Depakote  S/P AKA (above knee amputation)   DVT Prophylaxis: Prophylactic Lovenox   Code Status: Full code    Family Communication: None at bedside  Disposition Plan: Remain patient- back to SNF on discharge  Antimicrobial agents: See below  Procedures: None  CONSULTS:  None  Time spent: 25 minutes-Greater than 50% of this time was spent in counseling, explanation of diagnosis, planning of further management, and coordination of care.  MEDICATIONS: Anti-infectives    Start     Dose/Rate Route Frequency Ordered Stop   08/08/16 1400  cefTAZidime (FORTAZ) 1 g in dextrose 5 % 50 mL IVPB     1 g 100 mL/hr over 30 Minutes Intravenous Every 12 hours 08/08/16 1322     08/08/16 0315  cefTRIAXone (ROCEPHIN) 2 g in dextrose 5 % 50 mL IVPB     2 g 100 mL/hr over 30 Minutes Intravenous  Once 08/08/16 0308 08/08/16 0350      Scheduled Meds: . aspirin  81 mg Oral Daily  . cefTAZidime (FORTAZ)  IV  1 g Intravenous Q12H  . chlorhexidine  15 mL Mouth Rinse BID  . enoxaparin (LOVENOX) injection  40 mg Subcutaneous Q24H  . escitalopram  10 mg Per Tube Daily  . feeding supplement (PRO-STAT SUGAR FREE 64)  30 mL Per Tube TID WC  . folic acid-pyridoxine-cyancobalamin  1 tablet Per Tube Daily  . free water  200 mL Per Tube Q8H  . mouth rinse  15 mL Mouth Rinse q12n4p  . metoprolol tartrate  25 mg Per Tube BID  . multivitamin  1 tablet Oral Daily  . pantoprazole sodium  40 mg Per Tube Daily  . polyethylene glycol  17 g Per Tube Daily  . pravastatin  40 mg Per Tube Daily  .  Valproate Sodium  200 mg Per Tube Q8H  . ascorbic acid  500 mg Oral Daily  . zinc sulfate  220 mg Oral Daily   Continuous Infusions: . feeding supplement (OSMOLITE 1.5 CAL) 1,000 mL (08/09/16 0455)   PRN Meds:.acetaminophen **OR** acetaminophen, albuterol, bisacodyl, hydrALAZINE, ipratropium-albuterol, LORazepam, ondansetron **OR** ondansetron (ZOFRAN) IV, oxyCODONE   PHYSICAL EXAM: Vital signs: Vitals:   08/08/16 2025 08/09/16 0016 08/09/16 0504 08/09/16 0922  BP: 117/74 128/79 (!) 158/82 (!) 170/93  Pulse: (!) 103  89 (!) 109 (!) 108  Resp: 18 18 18    Temp: 97.9 F (36.6 C) 99.1 F (37.3 C) 98 F (36.7 C)   TempSrc: Oral Oral Oral   SpO2: 96% 98% 96%    There were no vitals filed for this visit. There is no height or weight on file to calculate BMI.   General appearance:Chronically ill-appearing female-she looks much older than her stated age. She is awake, but very dysarthric. Follows some commands. She is not in any acute distress.  Eyes:, pupils equally reactive to light and accomodation,no scleral icterus.Pink conjunctiva HEENT: Atraumatic and Normocephalic Neck: supple, no JVD. Resp:Good air entry bilaterally, no added sounds  CVS: S1 S2 regular, no murmurs.  GI: Bowel sounds present, Non tender and not distended with no gaurding, rigidity or rebound.No organomegaly. PEG tube site without any excessive discharge. Colostomy in place with stool in the bag. Extremities: Left AKA, right lower extremity without any edema-right lower extremity appears to be contracted at baseline  Neurology: Chronically dysarthric, left hemiplegia at baseline.   Wounds:see pic taken on 11/24       I have personally reviewed following labs and imaging studies  LABORATORY DATA: CBC:  Recent Labs Lab 08/08/16 0044 08/08/16 0626 08/09/16 0629  WBC 14.6* 12.8* 11.0*  NEUTROABS 9.1* 8.8*  --   HGB 10.0* 9.7* 10.3*  HCT 35.0* 34.3* 36.2  MCV 103.6* 104.3* 101.7*  PLT 304 283 302    Basic Metabolic Panel:  Recent Labs Lab 08/08/16 0044 08/08/16 0626 08/09/16 0629  NA 143 146* 145  K 4.7 4.6 4.4  CL 111 113* 111  CO2 26 29 26   GLUCOSE 90 92 119*  BUN 34* 30* 27*  CREATININE 0.35* 0.33* 0.33*  CALCIUM 8.2* 8.3* 8.3*    GFR: CrCl cannot be calculated (Unknown ideal weight.).  Liver Function Tests:  Recent Labs Lab 08/08/16 0044 08/08/16 0626  AST 24 19  ALT 15 13*  ALKPHOS 86 78  BILITOT 0.4 0.3  PROT 6.9 6.3*  ALBUMIN 1.5* 1.4*   No results for input(s): LIPASE, AMYLASE in the  last 168 hours.  Recent Labs Lab 08/08/16 0626  AMMONIA 26    Coagulation Profile: No results for input(s): INR, PROTIME in the last 168 hours.  Cardiac Enzymes: No results for input(s): CKTOTAL, CKMB, CKMBINDEX, TROPONINI in the last 168 hours.  BNP (last 3 results) No results for input(s): PROBNP in the last 8760 hours.  HbA1C: No results for input(s): HGBA1C in the last 72 hours.  CBG:  Recent Labs Lab 08/08/16 0613 08/08/16 1326 08/09/16 0014 08/09/16 0835  GLUCAP 89 130* 125* 123*    Lipid Profile: No results for input(s): CHOL, HDL, LDLCALC, TRIG, CHOLHDL, LDLDIRECT in the last 72 hours.  Thyroid Function Tests: No results for input(s): TSH, T4TOTAL, FREET4, T3FREE, THYROIDAB in the last 72 hours.  Anemia Panel: No results for input(s): VITAMINB12, FOLATE, FERRITIN, TIBC, IRON, RETICCTPCT in the last 72 hours.  Urine analysis:  Component Value Date/Time   COLORURINE AMBER (A) 08/08/2016 0115   APPEARANCEUR CLOUDY (A) 08/08/2016 0115   LABSPEC 1.024 08/08/2016 0115   PHURINE 5.5 08/08/2016 0115   GLUCOSEU NEGATIVE 08/08/2016 0115   HGBUR MODERATE (A) 08/08/2016 0115   BILIRUBINUR NEGATIVE 08/08/2016 0115   KETONESUR NEGATIVE 08/08/2016 0115   PROTEINUR 30 (A) 08/08/2016 0115   NITRITE POSITIVE (A) 08/08/2016 0115   LEUKOCYTESUR MODERATE (A) 08/08/2016 0115    Sepsis Labs: Lactic Acid, Venous    Component Value Date/Time   LATICACIDVEN 1.14 08/08/2016 0058    MICROBIOLOGY: Recent Results (from the past 240 hour(s))  Blood Culture (routine x 2)     Status: None (Preliminary result)   Collection Time: 08/08/16 12:35 AM  Result Value Ref Range Status   Specimen Description BLOOD RIGHT HAND  Final   Special Requests IN PEDIATRIC BOTTLE  Final   Culture NO GROWTH < 12 HOURS  Final   Report Status PENDING  Incomplete  Blood Culture (routine x 2)     Status: None (Preliminary result)   Collection Time: 08/08/16 12:50 AM  Result Value Ref  Range Status   Specimen Description BLOOD RIGHT HAND  Final   Special Requests BOTTLES DRAWN AEROBIC AND ANAEROBIC  Final   Culture NO GROWTH < 12 HOURS  Final   Report Status PENDING  Incomplete  Urine culture     Status: Abnormal (Preliminary result)   Collection Time: 08/08/16  1:15 AM  Result Value Ref Range Status   Specimen Description URINE, RANDOM  Final   Special Requests NONE  Final   Culture >=100,000 COLONIES/mL GRAM NEGATIVE RODS (A)  Final   Report Status PENDING  Incomplete    RADIOLOGY STUDIES/RESULTS: Dg Chest Port 1 View  Result Date: 08/08/2016 CLINICAL DATA:  Sepsis. Altered mental status and lethargy tonight. History of strokes. EXAM: PORTABLE CHEST 1 VIEW COMPARISON:  05/16/2016 FINDINGS: Postoperative changes in the thoracic spine. Shallow inspiration. Cardiac enlargement. Mild pulmonary vascular congestion. Linear atelectasis in the mid lungs bilaterally. No edema or consolidation. Costophrenic angles are obscured and can't exclude small pleural effusions. No pneumothorax. Calcified aorta. Old right rib fractures. IMPRESSION: Cardiac enlargement with pulmonary vascular congestion. No edema or consolidation. Linear atelectasis in the mid lungs. Electronically Signed   By: Burman Nieves M.D.   On: 08/08/2016 00:52     LOS: 1 day   Jeoffrey Massed, MD  [facility] Pager:[phone]  If 7PM-7AM, please contact night-coverage [email or web address] Password [facility] 08/09/2016, 11:38 AM

## 2016-08-09 NOTE — Progress Notes (Signed)
Initial Nutrition Assessment  DOCUMENTATION CODES:   Not applicable  INTERVENTION:   -Consider amending TF regimen to Osmolite 1.5 @ 60 ml/hr via PEG over 16 hour periods (1700-0900)  30 ml Prostat BID.    Tube feeding regimen provides 1640 kcal (100% of needs), 75 grams of protein, and 732 ml of H2O.   NUTRITION DIAGNOSIS:   Increased nutrient needs related to wound healing as evidenced by estimated needs.  GOAL:   Patient will meet greater than or equal to 90% of their needs  MONITOR:   Diet advancement, Labs, Weight trends, TF tolerance, Skin, I & O's  REASON FOR ASSESSMENT:   Malnutrition Screening Tool, Other (Comment) (new TF)    ASSESSMENT:   Katherine Palmer is a 55 y.o. female with history of CVA with left hemiparesis, with left AKA, stage IV decub ulcer with colostomy and PEG tube for feeding due to dysphagia, seizure disorder and HTN was brought to the ER from the skilled nursing facility after patient was found to be confused and encephalopathic. In the ER patient was found to have leukocytosis with fever. UA is compatible with UTI. On exam patient is only oriented to her name. No family at the bedside and baseline mental status not known.  Pt admitted with acute encephalopathy.   Pt is PEG dependent. Reviewed nursing home records, which indicates that pt receives a dysphagia 1 diet and 4 oz "house supplement" between meals. Also received nocturnal feedings of TwoCalHN @ 90 ml/hr over 16 hour period (1700-0900). Regimen provides 2880 kcals and 120 grams protein, which meets >100% of estimated kcal needs.   Reviewed CWOCN note; pt with stage IV pressure injury to sacrum/buttocks/rt trochanter and full thickness pressure injury to middle back.   Spoke with pt at bedside, who reports she receives TF and consumes PO's PTA. Pt was difficult to understand and did not always answer questions appropriately. Noted pt with no teeth.   Nutrition-Focused physical exam  completed. Findings are mild to moderate fat depletion, mild to moderate muscle depletion, and no edema.   Osmolite 1.5 is currently infusing @ 60 ml/hr via PEG.Pt also receiving 30 ml Prostat BID. Complete regimen provides 2360 kcals, 120 grams protein, and 1097 ml fluid daily (which meets >100% of estimated kcal and protein needs).   Reviewed rt hx; noted pt underwent lt AKA on 04/02/16, which may be contributing to wt loss. Wt has been stable x 3 months.   Medications reviewed and include MVI, Vitamin C, and zinc.   Labs reviewed: CBGS: 123-130.   Diet Order:   NPO  Skin:  Wound (see comment) (st IV sacrum/buttock/rt trochanter, FT middle back)  Last BM:  08/08/16  Height:   Ht Readings from Last 1 Encounters:  07/23/16 5\' 3"  (1.6 m)    Weight:   Wt Readings from Last 1 Encounters:  07/23/16 104 lb 15 oz (47.6 kg)    Ideal Body Weight:  48.1 kg  BMI:  Estimated body mass index is 18.59 kg/m as calculated from the following:   Height as of 07/23/16: 5\' 3"  (1.6 m).   Weight as of 07/23/16: 104 lb 15 oz (47.6 kg).  Estimated Nutritional Needs:   Kcal:  1450-1650  Protein:  70-85 grams  Fluid:  >1.4 L  EDUCATION NEEDS:   No education needs identified at this time  Yamaris Cummings A. Mayford KnifeWilliams, RD, LDN, CDE Pager: [phone] After hours Pager: [phone]

## 2016-08-10 LAB — GLUCOSE, CAPILLARY
GLUCOSE-CAPILLARY: 123 mg/dL — AB (ref 65–99)
GLUCOSE-CAPILLARY: 115 mg/dL — AB (ref 65–99)
Glucose-Capillary: 119 mg/dL — ABNORMAL HIGH (ref 65–99)

## 2016-08-10 LAB — MAGNESIUM: MAGNESIUM: 2.2 mg/dL (ref 1.7–2.4)

## 2016-08-10 MED ORDER — MAGNESIUM SULFATE 2 GM/50ML IV SOLN
2.0000 g | Freq: Once | INTRAVENOUS | Status: AC
Start: 2016-08-10 — End: 2016-08-10
  Administered 2016-08-10: 2 g via INTRAVENOUS
  Filled 2016-08-10 (×2): qty 50

## 2016-08-10 NOTE — Progress Notes (Signed)
Dr. Montez Moritaarter paged to notify patient had 5beats vtach (patient asymptomatic) earlier tonight, Currently ST with freq PVCs, Couplets at times. Inquiring if would like to redraw Mg level in AM . Awaiting response.

## 2016-08-10 NOTE — Progress Notes (Signed)
PROGRESS NOTE        PATIENT DETAILS Name: Katherine Palmer Age: 56 y.o. Sex: female Date of Birth: 02-01-1961 Admit Date: 08/08/2016 Admitting Physician Eduard Clos, MD ZOX:WRUEAV, Maxine Glenn, DO  Brief Narrative: Patient is a 55 y.o. female with history of CVA with left hemiparesis, with left AKA, chronic right lower extremity contracture, stage IV decub ulcer with colostomy and PEG tube for feeding due to dysphagia, seizure disorder and HTN was brought to the ER from the skilled nursing facility after patient was found to have altered mental status. She was thought to be encephalopathic from a UTI. She was subsequently admitted for further evaluation and treatment.   Subjective: Continues to be febrile-sleeping when I went in earlier this morning, easily awoken and was following some commands.   Assessment/Plan: Acute metabolic encephalopathy: Improved. Suspect likely due to UTI-sacral decubitus ulcer does not appear infected-see below for pictures taken on 11/24 . Continue empiric antimicrobial agents, and await urine cultures. Blood cultures are negative so far.   Systemic inflammatory response syndrome: Continues to be febrile, plans are to continue with ceftazidime and await further culture data. If fever continues, will may need imaging of her abdomen/pelvis-has history of stage IV chronic sacral decubitus ulcer.  UTI due to chronic indwelling Foley catheter: Continues to be febrile-but is hemodynamically stable, preliminary urine cultures shows both Escherichia coli and Pseudomonas-for now continue with Fortaz-await final culture results before changing antimicrobial therapy. As noted above, if fever continues in spite of being on appropriate antimicrobial therapy, probably will need imaging of her abdomen/pelvis given history of stage IV sacral decubitus ulcer.   Chronic sacral decubitus ulcer stage IV:-present prior to admission-see the low 4 pictures  taken on 11/24 -do not see any obvious infection at this time. However if fever continues in spite of being on appropriate antimicrobial agents for UTI, may need further imaging. Appreciate wound care evaluation. Note has a diverting colostomy in place.   History of CVA with chronic left-sided hemiplegia, dysarthria and dysphagia: PEG tube in place, continue aspirin and statin.   Hypertension: Continue with metoprolol.  History of seizures: Continue Depakote  S/P AKA (above knee amputation)  DVT Prophylaxis: Prophylactic Lovenox   Code Status: Full code   Family Communication: Son over the phone  Disposition Plan: Remain patient- back to SNF on discharge  Antimicrobial agents: See below  Procedures: None  CONSULTS:  None  Time spent: 25 minutes-Greater than 50% of this time was spent in counseling, explanation of diagnosis, planning of further management, and coordination of care.  MEDICATIONS: Anti-infectives    Start     Dose/Rate Route Frequency Ordered Stop   08/08/16 1400  cefTAZidime (FORTAZ) 1 g in dextrose 5 % 50 mL IVPB     1 g 100 mL/hr over 30 Minutes Intravenous Every 12 hours 08/08/16 1322     08/08/16 0315  cefTRIAXone (ROCEPHIN) 2 g in dextrose 5 % 50 mL IVPB     2 g 100 mL/hr over 30 Minutes Intravenous  Once 08/08/16 0308 08/08/16 0350      Scheduled Meds: . aspirin  81 mg Oral Daily  . cefTAZidime (FORTAZ)  IV  1 g Intravenous Q12H  . chlorhexidine  15 mL Mouth Rinse BID  . enoxaparin (LOVENOX) injection  40 mg Subcutaneous Q24H  . escitalopram  10 mg Per Tube Daily  .  feeding supplement (PRO-STAT SUGAR FREE 64)  30 mL Per Tube TID WC  . folic acid-pyridoxine-cyancobalamin  1 tablet Per Tube Daily  . free water  200 mL Per Tube Q8H  . mouth rinse  15 mL Mouth Rinse q12n4p  . metoprolol tartrate  25 mg Per Tube BID  . multivitamin  1 tablet Oral Daily  . pantoprazole sodium  40 mg Per Tube Daily  . polyethylene glycol  17 g Per Tube Daily    . pravastatin  40 mg Per Tube Daily  . Valproate Sodium  200 mg Per Tube Q8H  . ascorbic acid  500 mg Oral Daily  . zinc sulfate  220 mg Oral Daily   Continuous Infusions: . feeding supplement (OSMOLITE 1.5 CAL) 1,000 mL (08/10/16 0054)   PRN Meds:.acetaminophen **OR** acetaminophen, albuterol, bisacodyl, hydrALAZINE, ipratropium-albuterol, LORazepam, ondansetron **OR** ondansetron (ZOFRAN) IV, oxyCODONE   PHYSICAL EXAM: Vital signs: Vitals:   08/09/16 2227 08/10/16 0538 08/10/16 0746 08/10/16 1440  BP: 135/75 (!) 150/83  (!) 145/70  Pulse: (!) 102 (!) 119  95  Resp: 18 18    Temp: 99.2 F (37.3 C) 100.3 F (37.9 C) 99.7 F (37.6 C) 100 F (37.8 C)  TempSrc: Oral Oral Oral Oral  SpO2: 100% 100%  97%   There were no vitals filed for this visit. There is no height or weight on file to calculate BMI.   General appearance:Chronically ill-appearing female-she looks much older than her stated age. She is awake, but very dysarthric. Follows some commands. She is not in any acute distress.  Eyes:, pupils equally reactive to light and accomodation,no scleral icterus.Pink conjunctiva HEENT: Atraumatic and Normocephalic Neck: supple, no JVD. Resp:Good air entry bilaterally, no added sounds  CVS: S1 S2 regular, no murmurs.  GI: Bowel sounds present, Non tender and not distended with no gaurding, rigidity or rebound.No organomegaly. PEG tube site without any excessive discharge. Colostomy in place with stool in the bag. Extremities: Left AKA, right lower extremity without any edema-right lower extremity appears to be contracted at baseline  Neurology: Chronically dysarthric, left hemiplegia at baseline.   Wounds:see pic taken on 11/24       I have personally reviewed following labs and imaging studies  LABORATORY DATA: CBC:  Recent Labs Lab 08/08/16 0044 08/08/16 0626 08/09/16 0629  WBC 14.6* 12.8* 11.0*  NEUTROABS 9.1* 8.8*  --   HGB 10.0* 9.7* 10.3*  HCT 35.0* 34.3*  36.2  MCV 103.6* 104.3* 101.7*  PLT 304 283 302    Basic Metabolic Panel:  Recent Labs Lab 08/08/16 0044 08/08/16 0626 08/09/16 0629 08/10/16 0712  NA 143 146* 145  --   K 4.7 4.6 4.4  --   CL 111 113* 111  --   CO2 26 29 26   --   GLUCOSE 90 92 119*  --   BUN 34* 30* 27*  --   CREATININE 0.35* 0.33* 0.33*  --   CALCIUM 8.2* 8.3* 8.3*  --   MG  --   --   --  2.2    GFR: CrCl cannot be calculated (Unknown ideal weight.).  Liver Function Tests:  Recent Labs Lab 08/08/16 0044 08/08/16 0626  AST 24 19  ALT 15 13*  ALKPHOS 86 78  BILITOT 0.4 0.3  PROT 6.9 6.3*  ALBUMIN 1.5* 1.4*   No results for input(s): LIPASE, AMYLASE in the last 168 hours.  Recent Labs Lab 08/08/16 0626  AMMONIA 26    Coagulation Profile: No results  for input(s): INR, PROTIME in the last 168 hours.  Cardiac Enzymes: No results for input(s): CKTOTAL, CKMB, CKMBINDEX, TROPONINI in the last 168 hours.  BNP (last 3 results) No results for input(s): PROBNP in the last 8760 hours.  HbA1C: No results for input(s): HGBA1C in the last 72 hours.  CBG:  Recent Labs Lab 08/09/16 0014 08/09/16 0835 08/09/16 1727 08/10/16 0008 08/10/16 0900  GLUCAP 125* 123* 123* 119* 123*    Lipid Profile: No results for input(s): CHOL, HDL, LDLCALC, TRIG, CHOLHDL, LDLDIRECT in the last 72 hours.  Thyroid Function Tests: No results for input(s): TSH, T4TOTAL, FREET4, T3FREE, THYROIDAB in the last 72 hours.  Anemia Panel: No results for input(s): VITAMINB12, FOLATE, FERRITIN, TIBC, IRON, RETICCTPCT in the last 72 hours.  Urine analysis:    Component Value Date/Time   COLORURINE AMBER (A) 08/08/2016 0115   APPEARANCEUR CLOUDY (A) 08/08/2016 0115   LABSPEC 1.024 08/08/2016 0115   PHURINE 5.5 08/08/2016 0115   GLUCOSEU NEGATIVE 08/08/2016 0115   HGBUR MODERATE (A) 08/08/2016 0115   BILIRUBINUR NEGATIVE 08/08/2016 0115   KETONESUR NEGATIVE 08/08/2016 0115   PROTEINUR 30 (A) 08/08/2016 0115    NITRITE POSITIVE (A) 08/08/2016 0115   LEUKOCYTESUR MODERATE (A) 08/08/2016 0115    Sepsis Labs: Lactic Acid, Venous    Component Value Date/Time   LATICACIDVEN 1.14 08/08/2016 0058    MICROBIOLOGY: Recent Results (from the past 240 hour(s))  Blood Culture (routine x 2)     Status: None (Preliminary result)   Collection Time: 08/08/16 12:35 AM  Result Value Ref Range Status   Specimen Description BLOOD RIGHT HAND  Final   Special Requests IN PEDIATRIC BOTTLE 3ML  Final   Culture NO GROWTH 1 DAY  Final   Report Status PENDING  Incomplete  Blood Culture (routine x 2)     Status: None (Preliminary result)   Collection Time: 08/08/16 12:50 AM  Result Value Ref Range Status   Specimen Description BLOOD RIGHT HAND  Final   Special Requests BOTTLES DRAWN AEROBIC AND ANAEROBIC 5ML  Final   Culture NO GROWTH 1 DAY  Final   Report Status PENDING  Incomplete  Urine culture     Status: Abnormal (Preliminary result)   Collection Time: 08/08/16  1:15 AM  Result Value Ref Range Status   Specimen Description URINE, RANDOM  Final   Special Requests NONE  Final   Culture (A)  Final    >=100,000 COLONIES/mL ESCHERICHIA COLI 50,000 COLONIES/mL PSEUDOMONAS AERUGINOSA REPEATING SUSCEPTIBILITIES    Report Status PENDING  Incomplete    RADIOLOGY STUDIES/RESULTS: Dg Chest Port 1 View  Result Date: 08/08/2016 CLINICAL DATA:  Sepsis. Altered mental status and lethargy tonight. History of strokes. EXAM: PORTABLE CHEST 1 VIEW COMPARISON:  05/16/2016 FINDINGS: Postoperative changes in the thoracic spine. Shallow inspiration. Cardiac enlargement. Mild pulmonary vascular congestion. Linear atelectasis in the mid lungs bilaterally. No edema or consolidation. Costophrenic angles are obscured and can't exclude small pleural effusions. No pneumothorax. Calcified aorta. Old right rib fractures. IMPRESSION: Cardiac enlargement with pulmonary vascular congestion. No edema or consolidation. Linear atelectasis in  the mid lungs. Electronically Signed   By: Burman NievesWilliam  Stevens M.D.   On: 08/08/2016 00:52     LOS: 2 days   Jeoffrey MassedGHIMIRE,Lynton Crescenzo, MD  [facility] Pager:[phone]  If 7PM-7AM, please contact night-coverage [email or web address] Password [email or web address] 08/10/2016, 2:53 PM

## 2016-08-11 LAB — GLUCOSE, CAPILLARY
GLUCOSE-CAPILLARY: 107 mg/dL — AB (ref 65–99)
GLUCOSE-CAPILLARY: 127 mg/dL — AB (ref 65–99)
Glucose-Capillary: 124 mg/dL — ABNORMAL HIGH (ref 65–99)
Glucose-Capillary: 120 mg/dL — ABNORMAL HIGH (ref 65–99)

## 2016-08-11 LAB — CBC
HCT: 35.7 % — ABNORMAL LOW (ref 36.0–46.0)
HEMOGLOBIN: 10.5 g/dL — AB (ref 12.0–15.0)
MCH: 29.4 pg (ref 26.0–34.0)
MCHC: 29.4 g/dL — ABNORMAL LOW (ref 30.0–36.0)
MCV: 100 fL (ref 78.0–100.0)
PLATELETS: 274 10*3/uL (ref 150–400)
RBC: 3.57 MIL/uL — AB (ref 3.87–5.11)
RDW: 22.9 % — ABNORMAL HIGH (ref 11.5–15.5)
WBC: 9.6 10*3/uL (ref 4.0–10.5)

## 2016-08-11 LAB — BASIC METABOLIC PANEL
ANION GAP: 7 (ref 5–15)
BUN: 19 mg/dL (ref 6–20)
CO2: 27 mmol/L (ref 22–32)
Calcium: 8 mg/dL — ABNORMAL LOW (ref 8.9–10.3)
Chloride: 108 mmol/L (ref 101–111)
Creatinine, Ser: 0.34 mg/dL — ABNORMAL LOW (ref 0.44–1.00)
GFR calc Af Amer: >60 mL/min (ref >60)
Glucose, Bld: 114 mg/dL — ABNORMAL HIGH (ref 65–99)
POTASSIUM: 5.5 mmol/L — AB (ref 3.5–5.1)
SODIUM: 142 mmol/L (ref 135–145)

## 2016-08-11 MED ORDER — SODIUM POLYSTYRENE SULFONATE 15 GM/60ML PO SUSP
30.0000 g | Freq: Once | ORAL | Status: AC
  Administered 2016-08-11: 30 g
  Filled 2016-08-11: qty 120

## 2016-08-11 MED ORDER — MEROPENEM 1 G IV SOLR
1.0000 g | Freq: Two times a day (BID) | INTRAVENOUS | Status: DC
  Administered 2016-08-11 – 2016-08-12 (×3): 1 g via INTRAVENOUS
  Filled 2016-08-11 (×4): qty 1

## 2016-08-11 NOTE — Progress Notes (Addendum)
PROGRESS NOTE        PATIENT DETAILS Name: Katherine AhmadiDiane Greenfeld Age: 55 y.o. Sex: female Date of Birth: Dec 04, 1960 Admit Date: 08/08/2016 Admitting Physician Eduard ClosArshad N Kakrakandy, MD WUJ:[id number]:Carter, Maxine GlennMonica, DO  Brief Narrative: Patient is a 55 y.o. female with history of CVA with left hemiparesis, with left AKA, chronic right lower extremity contracture, stage IV decub ulcer with colostomy and PEG tube for feeding due to dysphagia, seizure disorder and HTN was brought to the ER from the skilled nursing facility after patient was found to have altered mental status. She was thought to be encephalopathic from a UTI. She was subsequently admitted for further evaluation and treatment.   Subjective: Fever curve much better-she is easily arousable-is slightly dysarthric-but follows commands and responds appropriately. Lying comfortably in bed  Assessment/Plan: Acute metabolic encephalopathy: Improved. Suspect likely due to UTI-sacral decubitus ulcer does not appear infected-see below for pictures taken on 11/24 . Urine culture positive for ESBL Escherichia coli, also growing Pseudomonas (sensitivities pending)-we will change from [place] to meropenem.   Systemic inflammatory response syndrome: Fever curve finally improving-afebrile overnight.  Urine culture positive for ESBL Escherichia coli, also growing Pseudomonas-we will change from [place] to meropenem.  If fever recurs, may need imaging of her abdomen/pelvis-has history of stage IV chronic sacral decubitus ulcer.  UTI due to chronic indwelling Foley catheter: Continues to be febrile-but is hemodynamically stable, preliminary urine culture positive for ESBL Escherichia coli, also growing Pseudomonas (sensitivity pending)-we'll discontinue Fortaz and start meropenem. Await final urine culture results. Fever curve is much better, do not think we need to pursue any further imaging at this point unless fever reoccurs.    Hyperkalemia:  Will give one dose of Kayexalate and recheck electrolytes tomorrow.  Chronic sacral decubitus ulcer stage IV:-present prior to admission-see the low 4 pictures taken on 11/24 -do not see any obvious infection at this time. Fever curve much better,however if fever reoccurs continues in spite of being on appropriate antimicrobial agents for UTI, may need further imaging. Appreciate wound care evaluation. Note has a diverting colostomy in place.   History of CVA with chronic left-sided hemiplegia, dysarthria and dysphagia: PEG tube in place, continue aspirin and statin.   Hypertension: Continue with metoprolol.  History of seizures: Continue Depakote  S/P AKA (above knee amputation)  DVT Prophylaxis: Prophylactic Lovenox   Code Status: Full code   Family Communication: Son over the phone  Disposition Plan: Remain patient- back to SNF on discharge  Antimicrobial agents: See below  Procedures: None  CONSULTS:  None  Time spent: 25 minutes-Greater than 50% of this time was spent in counseling, explanation of diagnosis, planning of further management, and coordination of care.  MEDICATIONS: Anti-infectives    Start     Dose/Rate Route Frequency Ordered Stop   08/11/16 1500  meropenem (MERREM) 1 g in sodium chloride 0.9 % 100 mL IVPB     1 g 200 mL/hr over 30 Minutes Intravenous Every 12 hours 08/11/16 1026     08/08/16 1400  cefTAZidime (FORTAZ) 1 g in dextrose 5 % 50 mL IVPB  Status:  Discontinued     1 g 100 mL/hr over 30 Minutes Intravenous Every 12 hours 08/08/16 1322 08/11/16 1026   08/08/16 0315  cefTRIAXone (ROCEPHIN) 2 g in dextrose 5 % 50 mL IVPB     2 g 100 mL/hr over 30  Minutes Intravenous  Once 08/08/16 0308 08/08/16 0350      Scheduled Meds: . aspirin  81 mg Oral Daily  . chlorhexidine  15 mL Mouth Rinse BID  . enoxaparin (LOVENOX) injection  40 mg Subcutaneous Q24H  . escitalopram  10 mg Per Tube Daily  . feeding supplement (PRO-STAT SUGAR FREE 64)  30  mL Per Tube TID WC  . folic acid-pyridoxine-cyancobalamin  1 tablet Per Tube Daily  . free water  200 mL Per Tube Q8H  . mouth rinse  15 mL Mouth Rinse q12n4p  . meropenem (MERREM) IV  1 g Intravenous Q12H  . metoprolol tartrate  25 mg Per Tube BID  . multivitamin  1 tablet Oral Daily  . pantoprazole sodium  40 mg Per Tube Daily  . polyethylene glycol  17 g Per Tube Daily  . pravastatin  40 mg Per Tube Daily  . Valproate Sodium  200 mg Per Tube Q8H  . ascorbic acid  500 mg Oral Daily  . zinc sulfate  220 mg Oral Daily   Continuous Infusions: . feeding supplement (OSMOLITE 1.5 CAL) 1,000 mL (08/10/16 1859)   PRN Meds:.acetaminophen **OR** acetaminophen, albuterol, bisacodyl, hydrALAZINE, ipratropium-albuterol, LORazepam, ondansetron **OR** ondansetron (ZOFRAN) IV, oxyCODONE   PHYSICAL EXAM: Vital signs: Vitals:   08/10/16 1440 08/10/16 2140 08/11/16 0500 08/11/16 0712  BP: (!) 145/70 (!) 159/77 (!) 169/75 128/68  Pulse: 95 (!) 117 (!) 120 (!) 109  Resp:  18 18   Temp: 100 F (37.8 C) 98.4 F (36.9 C) 98.4 F (36.9 C)   TempSrc: Oral Oral Oral   SpO2: 97% 100% 100%    There were no vitals filed for this visit. There is no height or weight on file to calculate BMI.   General appearance:Chronically ill-appearing female-she looks much older than her stated age. She is awake, but dysarthric. Follows some commands. She is not in any acute distress.  Eyes:, pupils equally reactive to light and accomodation,no scleral icterus.Pink conjunctiva HEENT: Atraumatic and Normocephalic Neck: supple, no JVD. Resp:Good air entry bilaterally, no added sounds  CVS: S1 S2 regular, no murmurs.  GI: Bowel sounds present, Non tender and not distended with no gaurding, rigidity or rebound.No organomegaly. PEG tube site without any excessive discharge. Colostomy in place with stool in the bag. Extremities: Left AKA, right lower extremity without any edema-right lower extremity appears to be  contracted at baseline  Neurology: Chronically dysarthric, left hemiplegia at baseline.   Wounds:see pic taken on 11/24       I have personally reviewed following labs and imaging studies  LABORATORY DATA: CBC:  Recent Labs Lab 08/08/16 0044 08/08/16 0626 08/09/16 0629 08/11/16 0542  WBC 14.6* 12.8* 11.0* 9.6  NEUTROABS 9.1* 8.8*  --   --   HGB 10.0* 9.7* 10.3* 10.5*  HCT 35.0* 34.3* 36.2 35.7*  MCV 103.6* 104.3* 101.7* 100.0  PLT 304 283 302 274    Basic Metabolic Panel:  Recent Labs Lab 08/08/16 0044 08/08/16 0626 08/09/16 0629 08/10/16 0712 08/11/16 0542  NA 143 146* 145  --  142  K 4.7 4.6 4.4  --  5.5*  CL 111 113* 111  --  108  CO2 26 29 26   --  27  GLUCOSE 90 92 119*  --  114*  BUN 34* 30* 27*  --  19  CREATININE 0.35* 0.33* 0.33*  --  0.34*  CALCIUM 8.2* 8.3* 8.3*  --  8.0*  MG  --   --   --  2.2  --     GFR: CrCl cannot be calculated (Unknown ideal weight.).  Liver Function Tests:  Recent Labs Lab 08/08/16 0044 08/08/16 0626  AST 24 19  ALT 15 13*  ALKPHOS 86 78  BILITOT 0.4 0.3  PROT 6.9 6.3*  ALBUMIN 1.5* 1.4*   No results for input(s): LIPASE, AMYLASE in the last 168 hours.  Recent Labs Lab 08/08/16 0626  AMMONIA 26    Coagulation Profile: No results for input(s): INR, PROTIME in the last 168 hours.  Cardiac Enzymes: No results for input(s): CKTOTAL, CKMB, CKMBINDEX, TROPONINI in the last 168 hours.  BNP (last 3 results) No results for input(s): PROBNP in the last 8760 hours.  HbA1C: No results for input(s): HGBA1C in the last 72 hours.  CBG:  Recent Labs Lab 08/10/16 0008 08/10/16 0900 08/10/16 1712 08/11/16 0023 08/11/16 0904  GLUCAP 119* 123* 115* 127* 124*    Lipid Profile: No results for input(s): CHOL, HDL, LDLCALC, TRIG, CHOLHDL, LDLDIRECT in the last 72 hours.  Thyroid Function Tests: No results for input(s): TSH, T4TOTAL, FREET4, T3FREE, THYROIDAB in the last 72 hours.  Anemia Panel: No results  for input(s): VITAMINB12, FOLATE, FERRITIN, TIBC, IRON, RETICCTPCT in the last 72 hours.  Urine analysis:    Component Value Date/Time   COLORURINE AMBER (A) 08/08/2016 0115   APPEARANCEUR CLOUDY (A) 08/08/2016 0115   LABSPEC 1.024 08/08/2016 0115   PHURINE 5.5 08/08/2016 0115   GLUCOSEU NEGATIVE 08/08/2016 0115   HGBUR MODERATE (A) 08/08/2016 0115   BILIRUBINUR NEGATIVE 08/08/2016 0115   KETONESUR NEGATIVE 08/08/2016 0115   PROTEINUR 30 (A) 08/08/2016 0115   NITRITE POSITIVE (A) 08/08/2016 0115   LEUKOCYTESUR MODERATE (A) 08/08/2016 0115    Sepsis Labs: Lactic Acid, Venous    Component Value Date/Time   LATICACIDVEN 1.14 08/08/2016 0058    MICROBIOLOGY: Recent Results (from the past 240 hour(s))  Blood Culture (routine x 2)     Status: None (Preliminary result)   Collection Time: 08/08/16 12:35 AM  Result Value Ref Range Status   Specimen Description BLOOD RIGHT HAND  Final   Special Requests IN PEDIATRIC BOTTLE  Final   Culture NO GROWTH 3 DAYS  Final   Report Status PENDING  Incomplete  Blood Culture (routine x 2)     Status: None (Preliminary result)   Collection Time: 08/08/16 12:50 AM  Result Value Ref Range Status   Specimen Description BLOOD RIGHT HAND  Final   Special Requests BOTTLES DRAWN AEROBIC AND ANAEROBIC  Final   Culture NO GROWTH 3 DAYS  Final   Report Status PENDING  Incomplete  Urine culture     Status: Abnormal (Preliminary result)   Collection Time: 08/08/16  1:15 AM  Result Value Ref Range Status   Specimen Description URINE, RANDOM  Final   Special Requests NONE  Final   Culture (A)  Final    >=100,000 COLONIES/mL ESCHERICHIA COLI Confirmed Extended Spectrum Beta-Lactamase Producer (ESBL) 50,000 COLONIES/mL PSEUDOMONAS AERUGINOSA SUSCEPTIBILITIES TO FOLLOW    Report Status PENDING  Incomplete   Organism ID, Bacteria ESCHERICHIA COLI (A)  Final      Susceptibility   Escherichia coli - MIC*    AMPICILLIN >=32 RESISTANT Resistant       CEFAZOLIN >=64 RESISTANT Resistant     CEFTRIAXONE >=64 RESISTANT Resistant     CIPROFLOXACIN >=4 RESISTANT Resistant     GENTAMICIN <=1 SENSITIVE Sensitive     IMIPENEM <=0.25 SENSITIVE Sensitive     NITROFURANTOIN <=  16 SENSITIVE Sensitive     TRIMETH/SULFA <=20 SENSITIVE Sensitive     AMPICILLIN/SULBACTAM >=32 RESISTANT Resistant     PIP/TAZO 8 SENSITIVE Sensitive     Extended ESBL POSITIVE Resistant     * >=100,000 COLONIES/mL ESCHERICHIA COLI    RADIOLOGY STUDIES/RESULTS: Dg Chest Port 1 View  Result Date: 08/08/2016 CLINICAL DATA:  Sepsis. Altered mental status and lethargy tonight. History of strokes. EXAM: PORTABLE CHEST 1 VIEW COMPARISON:  05/16/2016 FINDINGS: Postoperative changes in the thoracic spine. Shallow inspiration. Cardiac enlargement. Mild pulmonary vascular congestion. Linear atelectasis in the mid lungs bilaterally. No edema or consolidation. Costophrenic angles are obscured and can't exclude small pleural effusions. No pneumothorax. Calcified aorta. Old right rib fractures. IMPRESSION: Cardiac enlargement with pulmonary vascular congestion. No edema or consolidation. Linear atelectasis in the mid lungs. Electronically Signed   By: Burman NievesWilliam  Stevens M.D.   On: 08/08/2016 00:52     LOS: 3 days   Jeoffrey MassedGHIMIRE,SHANKER, MD  [facility] Pager:[phone]  If 7PM-7AM, please contact night-coverage [email or web address] Password [email or web address] 08/11/2016, 3:23 PM

## 2016-08-11 NOTE — Progress Notes (Signed)
Pharmacy Antibiotic Note  Katherine Palmer is a 55 y.o. female admitted on 08/08/2016 with encephalopathy and suspected UTI.  Pharmacy has been consulted for Meropenem dosing to broaden therapy due to ESBL E Coli in urine culture . Of note patient previously grew pseudomonas  that was resistant to imipenem in her urine but the significance of this organism is questionable (50,000 colonies).  WBC improved to within normal limits  and patient afebrile. Estimated CrCl ~40- 45 mL/min. Of note, patient has left AKA and is non-ambulatory.   Plan: Meropenem 1 gm every 12 hours Monitor renal function, cx results, clinical picture F/u duration of therapy   Antimicrobials this admission:  11/23 CTX>> 11/23 11/23 ceftaz >> 11/26 11/26 Meropenem >>  Dose adjustments this admission:    Microbiology results:  11/23 urine cx >> > 100,000 colonies ESBL E.Coli, 50,000 colonies Pseudomonas  11/23 blood cx >> ngtd   Previous Admission: 11/3 (old) urine cx >> 40,000 pseudomonas (R to cipro and imipenem) 10/31-11/4 CTX > 11/4-11/6 cefepime  10/31-11/2 Vanc   Temp (24hrs), Avg:98.9 F (37.2 C), Min:98.4 F (36.9 C), Max:100 F (37.8 C)   Recent Labs Lab 08/08/16 0044 08/08/16 0058 08/08/16 0626 08/09/16 0629 08/11/16 0542  WBC 14.6*  --  12.8* 11.0* 9.6  CREATININE 0.35*  --  0.33* 0.33* 0.34*  LATICACIDVEN  --  1.14  --   --   --     No Known Allergies  Bailey MechEmily Stewart, PharmD Pharmacy Resident  Pager [phone] 08/11/16 10:15 AM

## 2016-08-11 NOTE — Progress Notes (Signed)
Pt had a 5 beat run of VTACH. MD made aware. Pt asymptomatic. Will continue to monitor.

## 2016-08-12 DIAGNOSIS — Z89612 Acquired absence of left leg above knee: Secondary | ICD-10-CM

## 2016-08-12 DIAGNOSIS — A498 Other bacterial infections of unspecified site: Secondary | ICD-10-CM

## 2016-08-12 DIAGNOSIS — Z1612 Extended spectrum beta lactamase (ESBL) resistance: Secondary | ICD-10-CM

## 2016-08-12 LAB — BASIC METABOLIC PANEL
Anion gap: 5 (ref 5–15)
BUN: 20 mg/dL (ref 6–20)
CALCIUM: 8 mg/dL — AB (ref 8.9–10.3)
CO2: 29 mmol/L (ref 22–32)
CREATININE: 0.32 mg/dL — AB (ref 0.44–1.00)
Chloride: 110 mmol/L (ref 101–111)
GFR calc Af Amer: >60 mL/min (ref >60)
GFR calc non Af Amer: >60 mL/min (ref >60)
GLUCOSE: 127 mg/dL — AB (ref 65–99)
Potassium: 4.7 mmol/L (ref 3.5–5.1)
Sodium: 144 mmol/L (ref 135–145)

## 2016-08-12 LAB — URINE CULTURE

## 2016-08-12 LAB — GLUCOSE, CAPILLARY: Glucose-Capillary: 124 mg/dL — ABNORMAL HIGH (ref 65–99)

## 2016-08-12 MED ORDER — PIPERACILLIN-TAZOBACTAM 3.375 G IVPB
3.3750 g | Freq: Three times a day (TID) | INTRAVENOUS | Status: DC
  Administered 2016-08-12 – 2016-08-13 (×3): 3.375 g via INTRAVENOUS
  Filled 2016-08-12 (×5): qty 50

## 2016-08-12 MED ORDER — SODIUM CHLORIDE 0.9 % IV BOLUS (SEPSIS)
500.0000 mL | Freq: Once | INTRAVENOUS | Status: AC
  Administered 2016-08-12: 500 mL via INTRAVENOUS

## 2016-08-12 NOTE — NC FL2 (Signed)
[place] MEDICAID FL2 LEVEL OF CARE SCREENING TOOL     IDENTIFICATION  Patient Name: Katherine AhmadiDiane Paprocki Birthdate: 05/17/1961 Sex: female Admission Date (Current Location): 08/08/2016  [place]  [place] [id number] N Facility and Address:  [facility], [address], [place], [place] [address]      Provider Number: [id number]  Attending Physician Name and Address:  Merlene Laughtermair Latif Sheikh, DO  Relative Name and Phone Number:       Current Level of Care: Hospital Recommended Level of Care: Skilled Nursing Facility Prior Approval Number:    Date Approved/Denied:   PASRR Number: [id number]  Discharge Plan: SNF    Current Diagnoses: Patient Active Problem List   Diagnosis Date Noted  . Altered mental status 08/08/2016  . Urinary tract infection associated with indwelling urethral catheter (HCC) 07/18/2016  . Pressure injury of skin 07/17/2016  . Acute hyperkalemia 07/16/2016  . Protein-calorie malnutrition, severe 05/21/2016  . Stage IV decubitus ulcer (HCC) 05/18/2016  . Acute encephalopathy   . Cerebral infarction (HCC)   . Hx of AKA (above knee amputation) (HCC)   . Hemiparesis affecting left side as late effect of cerebrovascular accident (CVA) (HCC) 04/11/2016  . Essential hypertension, benign 04/11/2016  . Failure to thrive syndrome, adult 04/11/2016  . Dysphagia due to old cerebrovascular accident 04/11/2016  . Depression with anxiety 04/11/2016  . Colostomy present (HCC) 04/11/2016  . S/P AKA (above knee amputation) (HCC) 04/11/2016  . Anemia of chronic disease 04/11/2016    Orientation RESPIRATION BLADDER Height & Weight     Self  O2 (Nasal cannula 2L) Incontinent, Indwelling catheter (Urethral catheter) Weight:   Height:     BEHAVIORAL SYMPTOMS/MOOD NEUROLOGICAL BOWEL NUTRITION STATUS      Incontinent (colostomy; jujunostomy) Feeding tube  AMBULATORY STATUS COMMUNICATION OF NEEDS Skin   Extensive Assist Verbally PU  Stage and Appropriate Care (Stage II pressure injury on back and breast and stage iV on sacrum;;)                       Personal Care Assistance Level of Assistance  Bathing, Dressing, Feeding Bathing Assistance: Maximum assistance Feeding assistance: Limited assistance Dressing Assistance: Maximum assistance     Functional Limitations Info             SPECIAL CARE FACTORS FREQUENCY  PT (By licensed PT)     PT Frequency: not yet assessed              Contractures      Additional Factors Info  Code Status, Allergies, Isolation Precautions Code Status Info: Full Allergies Info: NKA     Isolation Precautions Info: ESBL; MRSA     Current Medications (08/12/2016):  This is the current hospital active medication list Current Facility-Administered Medications  Medication Dose Route Frequency Provider Last Rate Last Dose  . acetaminophen (TYLENOL) tablet 650 mg  650 mg Oral Q6H PRN Eduard ClosArshad N Kakrakandy, MD   650 mg at 08/12/16 1433   Or  . acetaminophen (TYLENOL) suppository 650 mg  650 mg Rectal Q6H PRN Eduard ClosArshad N Kakrakandy, MD      . albuterol (PROVENTIL) (2.5 MG/3ML) 0.083% nebulizer solution 2.5 mg  2.5 mg Nebulization Q4H PRN Eduard ClosArshad N Kakrakandy, MD      . aspirin chewable tablet 81 mg  81 mg Oral Daily Eduard ClosArshad N Kakrakandy, MD   81 mg at 08/12/16 1032  . bisacodyl (DULCOLAX) suppository 10 mg  10 mg Rectal Daily PRN  Eduard Clos, MD      . chlorhexidine (PERIDEX) 0.12 % solution 15 mL  15 mL Mouth Rinse BID Maretta Bees, MD   15 mL at 08/12/16 1035  . enoxaparin (LOVENOX) injection 40 mg  40 mg Subcutaneous Q24H Eduard Clos, MD   40 mg at 08/11/16 1716  . escitalopram (LEXAPRO) tablet 10 mg  10 mg Per Tube Daily Eduard Clos, MD   10 mg at 08/12/16 1032  . feeding supplement (OSMOLITE 1.5 CAL) liquid 1,000 mL  1,000 mL Per Tube Continuous Eduard Clos, MD 60 mL/hr at 08/12/16 0400 1,000 mL at 08/12/16 0400  . feeding supplement  (PRO-STAT SUGAR FREE 64) liquid 30 mL  30 mL Per Tube TID WC Eduard Clos, MD   30 mL at 08/12/16 1700  . folic acid-pyridoxine-cyancobalamin (FOLTX) 2.5-25-2 MG per tablet 1 tablet  1 tablet Per Tube Daily Eduard Clos, MD   1 tablet at 08/12/16 1043  . free water 200 mL  200 mL Per Tube Q8H Shanker Levora Dredge, MD   200 mL at 08/12/16 1443  . hydrALAZINE (APRESOLINE) injection 10 mg  10 mg Intravenous Q4H PRN Eduard Clos, MD   10 mg at 08/11/16 0512  . ipratropium-albuterol (DUONEB) 0.5-2.5 (3) MG/3ML nebulizer solution 3 mL  3 mL Nebulization Q6H PRN Eduard Clos, MD      . LORazepam (ATIVAN) tablet 0.5 mg  0.5 mg Per Tube Q8H PRN Eduard Clos, MD      . MEDLINE mouth rinse  15 mL Mouth Rinse q12n4p Maretta Bees, MD   15 mL at 08/12/16 1650  . meropenem (MERREM) 1 g in sodium chloride 0.9 % 100 mL IVPB  1 g Intravenous Q12H Hillis Range, RPH   1 g at 08/12/16 1652  . metoprolol tartrate (LOPRESSOR) tablet 25 mg  25 mg Per Tube BID Eduard Clos, MD   25 mg at 08/12/16 1033  . multivitamin (PROSIGHT) tablet 1 tablet  1 tablet Oral Daily Eduard Clos, MD   1 tablet at 08/12/16 1034  . ondansetron (ZOFRAN) tablet 4 mg  4 mg Oral Q6H PRN Eduard Clos, MD       Or  . ondansetron [facility]) injection 4 mg  4 mg Intravenous Q6H PRN Eduard Clos, MD      . oxyCODONE (Oxy IR/ROXICODONE) immediate release tablet 5 mg  5 mg Oral Q6H PRN Eduard Clos, MD   5 mg at 08/11/16 0512  . pantoprazole sodium (PROTONIX) 40 mg/20 mL oral suspension 40 mg  40 mg Per Tube Daily Eduard Clos, MD   40 mg at 08/12/16 1046  . polyethylene glycol (MIRALAX / GLYCOLAX) packet 17 g  17 g Per Tube Daily Eduard Clos, MD   17 g at 08/12/16 1035  . pravastatin (PRAVACHOL) tablet 40 mg  40 mg Per Tube Daily Eduard Clos, MD   40 mg at 08/12/16 1033  . Valproate Sodium (DEPAKENE) solution 200 mg  200 mg Per Tube Q8H Eduard Clos, MD    200 mg at 08/12/16 1432  . vitamin C (ASCORBIC ACID) tablet 500 mg  500 mg Oral Daily Eduard Clos, MD   500 mg at 08/12/16 1032  . zinc sulfate capsule 220 mg  220 mg Oral Daily Eduard Clos, MD   220 mg at 08/12/16 1033     Discharge Medications: Please see discharge summary for  a list of discharge medications.  Relevant Imaging Results:  Relevant Lab Results:   Additional Information SS # [id number]  Mearl LatinNadia S Genowefa Morga, LCSWA

## 2016-08-12 NOTE — Progress Notes (Signed)
PROGRESS NOTE    Katherine Palmer  WJX:[id number] DOB: 02/15/1961 DOA: 08/08/2016 PCP: Kirt Boys, DO  Brief Narrative:  Patient is a 55 y.o. female with history of CVA with left hemiparesis, with left AKA, chronic right lower extremity contracture, stage IV decub ulcer with colostomy and PEG tube for feeding due to dysphagia, seizure disorder and HTNwas brought to the ER from the skilled nursing facility after patient was found to have altered mental status. She was thought to be encephalopathic from a UTI. She was subsequently admitted for further evaluation and treatment.   Assessment & Plan:   Principal Problem:   Acute encephalopathy Active Problems:   Hemiparesis affecting left side as late effect of cerebrovascular accident (CVA) (HCC)   Essential hypertension, benign   Dysphagia due to old cerebrovascular accident   S/P AKA (above knee amputation) (HCC)   Stage IV decubitus ulcer (HCC)   Urinary tract infection associated with indwelling urethral catheter (HCC)   Altered mental status  Acute Metabolic Encephalopathy:  -Improving. Suspect likely due to UTI-sacral decubitus ulcer does not appear infected-see pictures taken on 11/24 by Dr. Jerral Ralph.  -Urine culture positive for ESBL Escherichia coli, also growing Pseudomonas which is sensitive to Zosyn- Will change to Zosyn as patient's E.Coli is sensitive (MIC of 8) and Pseudomonas is sensitive (MIC of 34) after discussion with Infectious Disease Dr. Enedina Finner  Sepsis from ESBL E Coli and Pseudomonas UTI -Patient was Febrile, Had WBC, and was Tachycardic on Admission -Sepsis Physiology Improving; Bolused 500 mL today -Urine culture positive for ESBL Escherichia coli, also growing Pseudomonas-Will change Meropenem to Zosyn -If fever recurs tomorrow, may need imaging of her abdomen/pelvis-has history of stage IV chronic sacral decubitus ulcer.  UTI due to chronic indwelling Foley catheter:  -Continues to be febrile-but is  hemodynamically stable, urine culture positive for >100,000 CFU ESBL Escherichia coli and >50,000 CFU of Pseudomonas Aeruginosa -Fever again today. May consider Imaging of Abdomen and Pelvis tomorrow if Persists  Hyperkalemia:  -Resolved after dose of Kayexalate; K+ was 4.7 -Repeat BMP in AM  Chronic sacral decubitus ulcer stage IV: -present prior to admission- See pictures taken on 11/24 by Dr. Jerral Ralph  -do not see any obvious infection at this time. F -May need further imaging. Appreciate wound care evaluation. Note has a diverting colostomy in place.   History of CVA with residual left-sided Hemiplegia, Dysarthria and Dysphagia:  -PEG tube in place -Continue Aspirin 81 mg and Statin.   Hypertension:  -Continue with Metoprolol 25 mg po Per PEG BID -C/w Hydralazine 10 mg IV q4hprn for SBP > 160   Seizure Disorder:  -Continue Valproate Sodium 200 mg Per PEG q8h  Depression -C/w Escitalopram 10 mg Per PEG Daily  S/P Left AKA (above knee amputation)  DVT prophylaxis: Lovenox Code Status: FULL Family Communication: No Family at bedside during the Encounter Disposition Plan: SNF when stable  Consultants:   Infectious Diseases Dr. Vernia Buff via Phone Consultation  Procedures:  None   Antimicrobials: IV Meropenem D/C'd 08/12/16.  IV Zosyn started 08/12/16 -->   Subjective: Seen and examined this AM and was not oriented but knew she was in the hospital. Wanted to have water. Denied CP/N/V/Abdominal Pain. No Lightheadedness or dizziness.   Objective: Vitals:   08/11/16 0500 08/11/16 0712 08/11/16 2048 08/12/16 1030  BP: (!) 169/75 128/68 126/71 (!) 153/81  Pulse: (!) 120 (!) 109 (!) 121 (!) 116  Resp: 18  18 18   Temp: 98.4 F (36.9 C)  98.2  F (36.8 C) (!) 100.7 F (38.2 C)  TempSrc: Oral  Oral Oral  SpO2: 100%  99% 100%    Intake/Output Summary (Last 24 hours) at 08/12/16 1256 Last data filed at 08/12/16 0425  Gross per 24 hour  Intake              1898 ml  Output              700 ml  Net             1198 ml   There were no vitals filed for this visit.  Examination: Physical Exam:  Constitutional: WN/WD, chronically ill appearing AAF Eyes:Lids and conjunctivae normal, sclerae anicteric  ENMT: External Ears, Nose appear normal. Decreased hearing.  Neck: Appears normal, supple, no cervical masses, normal ROM, no appreciable thyromegaly Respiratory: Diminished bilaterally, no wheezing, rales, rhonchi or crackles. Normal respiratory effort and patient is not tachypenic. No accessory muscle use.  Cardiovascular: Tachycardic Rate but Regular Rhythm, no murmurs / rubs / gallops. S1 and S2 auscultated.  Abdomen: Soft, non-tender, non-distended. No masses palpated. No appreciable hepatosplenomegaly. Bowel sounds positive.Colostomy bag in place draining clay colored stool. PEG tube in place with Tube Feedings running.  GU: Deferred. Chronic Indwelling Foley.  Musculoskeletal: No clubbing / cyanosis of digits/nails. Left AKA Skin: Stage 4 Sacral Decubitus Ulcer present.   Neurologic: Awake but not alert. Has dyarthria Romberg sign cerebellar reflexes not assessed.  Psychiatric: Altered judgment and insight. Normal mood and appropriate affect.   Data Reviewed: I have personally reviewed following labs and imaging studies  CBC:  Recent Labs Lab 08/08/16 0044 08/08/16 0626 08/09/16 0629 08/11/16 0542  WBC 14.6* 12.8* 11.0* 9.6  NEUTROABS 9.1* 8.8*  --   --   HGB 10.0* 9.7* 10.3* 10.5*  HCT 35.0* 34.3* 36.2 35.7*  MCV 103.6* 104.3* 101.7* 100.0  PLT 304 283 302 274   Basic Metabolic Panel:  Recent Labs Lab 08/08/16 0044 08/08/16 0626 08/09/16 0629 08/10/16 0712 08/11/16 0542 08/12/16 0634  NA 143 146* 145  --  142 144  K 4.7 4.6 4.4  --  5.5* 4.7  CL 111 113* 111  --  108 110  CO2 26 29 26   --  27 29  GLUCOSE 90 92 119*  --  114* 127*  BUN 34* 30* 27*  --  19 20  CREATININE 0.35* 0.33* 0.33*  --  0.34* 0.32*  CALCIUM  8.2* 8.3* 8.3*  --  8.0* 8.0*  MG  --   --   --  2.2  --   --    GFR: CrCl cannot be calculated (Unknown ideal weight.). Liver Function Tests:  Recent Labs Lab 08/08/16 0044 08/08/16 0626  AST 24 19  ALT 15 13*  ALKPHOS 86 78  BILITOT 0.4 0.3  PROT 6.9 6.3*  ALBUMIN 1.5* 1.4*   No results for input(s): LIPASE, AMYLASE in the last 168 hours.  Recent Labs Lab 08/08/16 0626  AMMONIA 26   Coagulation Profile: No results for input(s): INR, PROTIME in the last 168 hours. Cardiac Enzymes: No results for input(s): CKTOTAL, CKMB, CKMBINDEX, TROPONINI in the last 168 hours. BNP (last 3 results) No results for input(s): PROBNP in the last 8760 hours. HbA1C: No results for input(s): HGBA1C in the last 72 hours. CBG:  Recent Labs Lab 08/11/16 0023 08/11/16 0904 08/11/16 1742 08/11/16 2046 08/12/16 0758  GLUCAP 127* 124* 120* 107* 124*   Lipid Profile: No results for input(s): CHOL, HDL, LDLCALC, TRIG,  CHOLHDL, LDLDIRECT in the last 72 hours. Thyroid Function Tests: No results for input(s): TSH, T4TOTAL, FREET4, T3FREE, THYROIDAB in the last 72 hours. Anemia Panel: No results for input(s): VITAMINB12, FOLATE, FERRITIN, TIBC, IRON, RETICCTPCT in the last 72 hours. Sepsis Labs:  Recent Labs Lab 08/08/16 0058  LATICACIDVEN 1.14    Recent Results (from the past 240 hour(s))  Blood Culture (routine x 2)     Status: None (Preliminary result)   Collection Time: 08/08/16 12:35 AM  Result Value Ref Range Status   Specimen Description BLOOD RIGHT HAND  Final   Special Requests IN PEDIATRIC BOTTLE 3ML  Final   Culture NO GROWTH 4 DAYS  Final   Report Status PENDING  Incomplete  Blood Culture (routine x 2)     Status: None (Preliminary result)   Collection Time: 08/08/16 12:50 AM  Result Value Ref Range Status   Specimen Description BLOOD RIGHT HAND  Final   Special Requests BOTTLES DRAWN AEROBIC AND ANAEROBIC 5ML  Final   Culture NO GROWTH 4 DAYS  Final   Report Status  PENDING  Incomplete  Urine culture     Status: Abnormal   Collection Time: 08/08/16  1:15 AM  Result Value Ref Range Status   Specimen Description URINE, RANDOM  Final   Special Requests NONE  Final   Culture (A)  Final    >=100,000 COLONIES/mL ESCHERICHIA COLI Confirmed Extended Spectrum Beta-Lactamase Producer (ESBL) 50,000 COLONIES/mL PSEUDOMONAS AERUGINOSA    Report Status 08/12/2016 FINAL  Final   Organism ID, Bacteria ESCHERICHIA COLI (A)  Final   Organism ID, Bacteria PSEUDOMONAS AERUGINOSA (A)  Final      Susceptibility   Escherichia coli - MIC*    AMPICILLIN >=32 RESISTANT Resistant     CEFAZOLIN >=64 RESISTANT Resistant     CEFTRIAXONE >=64 RESISTANT Resistant     CIPROFLOXACIN >=4 RESISTANT Resistant     GENTAMICIN <=1 SENSITIVE Sensitive     IMIPENEM <=0.25 SENSITIVE Sensitive     NITROFURANTOIN <=16 SENSITIVE Sensitive     TRIMETH/SULFA <=20 SENSITIVE Sensitive     AMPICILLIN/SULBACTAM >=32 RESISTANT Resistant     PIP/TAZO 8 SENSITIVE Sensitive     Extended ESBL POSITIVE Resistant     * >=100,000 COLONIES/mL ESCHERICHIA COLI   Pseudomonas aeruginosa - MIC*    CEFTAZIDIME 4 SENSITIVE Sensitive     CIPROFLOXACIN 2 INTERMEDIATE Intermediate     GENTAMICIN <=1 SENSITIVE Sensitive     IMIPENEM >=16 RESISTANT Resistant     PIP/TAZO 32 SENSITIVE Sensitive     CEFEPIME 4 SENSITIVE Sensitive     * 50,000 COLONIES/mL PSEUDOMONAS AERUGINOSA    Radiology Studies: No results found.   Scheduled Meds: . aspirin  81 mg Oral Daily  . chlorhexidine  15 mL Mouth Rinse BID  . enoxaparin (LOVENOX) injection  40 mg Subcutaneous Q24H  . escitalopram  10 mg Per Tube Daily  . feeding supplement (PRO-STAT SUGAR FREE 64)  30 mL Per Tube TID WC  . folic acid-pyridoxine-cyancobalamin  1 tablet Per Tube Daily  . free water  200 mL Per Tube Q8H  . mouth rinse  15 mL Mouth Rinse q12n4p  . meropenem (MERREM) IV  1 g Intravenous Q12H  . metoprolol tartrate  25 mg Per Tube BID  .  multivitamin  1 tablet Oral Daily  . pantoprazole sodium  40 mg Per Tube Daily  . polyethylene glycol  17 g Per Tube Daily  . pravastatin  40 mg Per Tube Daily  .  Valproate Sodium  200 mg Per Tube Q8H  . ascorbic acid  500 mg Oral Daily  . zinc sulfate  220 mg Oral Daily   Continuous Infusions: . feeding supplement (OSMOLITE 1.5 CAL) 1,000 mL (08/12/16 0400)    LOS: 4 days   Merlene Laughter, DO [facility] Pager [phone]  If 7PM-7AM, please contact night-coverage [email or web address] Password [facility] 08/12/2016, 12:56 PM

## 2016-08-13 LAB — CBC WITH DIFFERENTIAL/PLATELET
BASOS ABS: 0 10*3/uL (ref 0.0–0.1)
Basophils Relative: 0 %
Eosinophils Absolute: 1.1 10*3/uL — ABNORMAL HIGH (ref 0.0–0.7)
Eosinophils Relative: 11 %
HEMATOCRIT: 32.2 % — AB (ref 36.0–46.0)
Hemoglobin: 9.3 g/dL — ABNORMAL LOW (ref 12.0–15.0)
LYMPHS ABS: 2.2 10*3/uL (ref 0.7–4.0)
Lymphocytes Relative: 22 %
MCH: 29.1 pg (ref 26.0–34.0)
MCHC: 28.9 g/dL — AB (ref 30.0–36.0)
MCV: 100.6 fL — ABNORMAL HIGH (ref 78.0–100.0)
MONOS PCT: 14 %
Monocytes Absolute: 1.4 10*3/uL — ABNORMAL HIGH (ref 0.1–1.0)
NEUTROS ABS: 5.2 10*3/uL (ref 1.7–7.7)
Neutrophils Relative %: 53 %
Platelets: 244 10*3/uL (ref 150–400)
RBC: 3.2 MIL/uL — ABNORMAL LOW (ref 3.87–5.11)
RDW: 21.6 % — AB (ref 11.5–15.5)
WBC: 9.9 10*3/uL (ref 4.0–10.5)

## 2016-08-13 LAB — COMPREHENSIVE METABOLIC PANEL
ALBUMIN: 1.3 g/dL — AB (ref 3.5–5.0)
ALT: 19 U/L (ref 14–54)
ANION GAP: 8 (ref 5–15)
AST: 31 U/L (ref 15–41)
Alkaline Phosphatase: 65 U/L (ref 38–126)
BILIRUBIN TOTAL: 0.2 mg/dL — AB (ref 0.3–1.2)
BUN: 22 mg/dL — ABNORMAL HIGH (ref 6–20)
CO2: 24 mmol/L (ref 22–32)
Calcium: 8.1 mg/dL — ABNORMAL LOW (ref 8.9–10.3)
Chloride: 113 mmol/L — ABNORMAL HIGH (ref 101–111)
Creatinine, Ser: 0.34 mg/dL — ABNORMAL LOW (ref 0.44–1.00)
GLUCOSE: 120 mg/dL — AB (ref 65–99)
POTASSIUM: 4.2 mmol/L (ref 3.5–5.1)
Sodium: 145 mmol/L (ref 135–145)
TOTAL PROTEIN: 5.9 g/dL — AB (ref 6.5–8.1)

## 2016-08-13 LAB — CULTURE, BLOOD (ROUTINE X 2)
Culture: NO GROWTH
Culture: NO GROWTH

## 2016-08-13 LAB — MAGNESIUM: MAGNESIUM: 1.8 mg/dL (ref 1.7–2.4)

## 2016-08-13 LAB — GLUCOSE, CAPILLARY
GLUCOSE-CAPILLARY: 132 mg/dL — AB (ref 65–99)
GLUCOSE-CAPILLARY: 111 mg/dL — AB (ref 65–99)

## 2016-08-13 LAB — PHOSPHORUS: PHOSPHORUS: 2.3 mg/dL — AB (ref 2.5–4.6)

## 2016-08-13 MED ORDER — FOSFOMYCIN TROMETHAMINE 3 G PO PACK
3.0000 g | PACK | ORAL | Status: DC
  Administered 2016-08-13 – 2016-08-16 (×2): 3 g via ORAL
  Filled 2016-08-13 (×4): qty 3

## 2016-08-13 NOTE — Clinical Social Work Note (Signed)
Clinical Social Work Assessment  Patient Details  Name: Katherine AhmadiDiane Palmer MRN: [id number] Date of Birth: 15-Oct-1960  Date of referral:  08/13/16               Reason for consult:  Facility Placement                Permission sought to share information with:  Facility Medical sales representativeContact Representative, Family Supports Permission granted to share information::  No (Patient is disoriented; completed assessment with son)  Name::        Agency::  [facility]  Relationship::  Son  SolicitorContact Information:     Housing/Transportation Living arrangements for the past 2 months:  Skilled Building surveyorursing Facility Source of Information:  Adult Children Patient Interpreter Needed:  None Criminal Activity/Legal Involvement Pertinent to Current Situation/Hospitalization:  No - Comment as needed Significant Relationships:  Adult Children Lives with:  Facility Resident Do you feel safe going back to the place where you live?  Yes Need for family participation in patient care:  Yes (Comment)  Care giving concerns:  CSW received consult regarding discharge planning. Patient is disoriented. Patient's [facility]. CSW to continue to follow for needs.    Social Worker assessment / plan:  CSW spoke with patient's son regarding returning to snf when stable.  Employment status:  Disabled (Comment on whether or not currently receiving Disability) Insurance information[place]  [place] Recommendations:  Not assessed at this time Information / Referral to community resources:  Skilled Nursing Facility  Patient/Family's Response to care:  Patient's son reports being hopeful for patient's safe return to facility. He expressed appreciation for Hospital care.  Patient/Family's Understanding of and Emotional Response to Diagnosis, Current Treatment, and Prognosis:  Patient/family is realistic regarding therapy needs and expressed being hopeful for SNF placement.  Patient expressed understanding of CSW role and discharge process. No questions/concerns about plan or treatment.    Emotional Assessment Appearance:  Appears stated age Attitude/Demeanor/Rapport:  Unable to Assess Affect (typically observed):  Unable to Assess Orientation:  Oriented to Self Alcohol / Substance use:  Not Applicable Psych involvement (Current and /or in the community):  No (Comment)  Discharge Needs  Concerns to be addressed:  Care Coordination Readmission within the last 30 days:  Yes Current discharge risk:  None Barriers to Discharge:  Continued Medical Work up   [employer] [place], LCSWA 08/13/2016, 3:46 PM

## 2016-08-13 NOTE — Progress Notes (Signed)
PROGRESS NOTE  Felix AhmadiDiane Hunley  ONG:[id number] DOB: 10-Oct-1960 DOA: 08/08/2016 PCP: Kirt Boysarter, Monica, DO   Brief Narrative: Patient is a 55 y.o.female with history of CVA with left hemiparesis, with left AKA, chronic right lower extremity contracture, stage IV decub ulcer with colostomy and PEG tube for feeding due to dysphagia, seizure disorder and HTNwas brought to the ER from the skilled nursing facility after patient was found to have altered mental status. She was thought to be encephalopathic from a UTI. She was subsequently admitted for further evaluation and treatment.   Assessment & Plan: Principal Problem:   Acute encephalopathy Active Problems:   Hemiparesis affecting left side as late effect of cerebrovascular accident (CVA) (HCC)   Essential hypertension, benign   Dysphagia due to old cerebrovascular accident   S/P AKA (above knee amputation) (HCC)   Stage IV decubitus ulcer (HCC)   Urinary tract infection associated with indwelling urethral catheter (HCC)   Altered mental status  Sepsis from ESBL E Coli and Pseudomonas UTI: Sepsis improving but not resolving, still febrile but hemodynamically stable. No suspicion for intraabdominal or pulmonary sources at this time. UCx with >100,000 CFU ESBL Escherichia coli and >50,000 CFU of Pseudomonas Aeruginosa. Suspect zosyn (with MIC 32) is inadequately treating infections.  - Pharmacy discussed with ID, Dr. Drue SecondSnider, who recommends fosfomycin (presumed efficacy against ESBL EColi) x 3 days for complicated UTI. - Has been on zosyn and meropenem this hospitalization. - May need imaging of her abdomen/pelvis-has history of stage IV chronic sacral decubitus ulcer.  Acute Metabolic Encephalopathy: EEG not performed due to hair matting.  - Improving.Suspect likely due to UTI  Hyperkalemia:Resolved after dose of Kayexalate; K+ was 4.7 - Repeat BMP in AM  Chronic sacral decubitus ulcer stage IV: Present PTA. See pictures taken on 11/24  by Dr. Jerral RalphGhimire. No active infection at this time.  - Appreciate WOC assistance.  - If Sepsis not improving on fosfomycin, consider MRI to evaluate for osteomyelitis.   History of CVA with residual left-sided Hemiplegia, Dysarthria and Dysphagia:  -PEG tube in place -Continue Aspirin 81 mg and Statin.   Hypertension:  -Continue with Metoprolol 25 mg po per PEG BID -C/w Hydralazine 10 mg IV q4hprn for SBP > 160   Seizure Disorder:  -Continue Valproate Sodium 200 mg Per PEG q8h: level was < 10 on admission (goal 50-100)  Depression -C/w Escitalopram 10 mg Per PEG Daily  S/P Left AKA (above knee amputation)  DVT prophylaxis: Lovenox Code Status: Full Family Communication: None at bedside this AM Disposition Plan: SNF once sepsis resolved, requiring IV antibiotics.   Consultants:   Infectious disease, Dr. Ninetta LightsHatcher by phone 11/27 and Dr. Drue SecondSnider by phone 11/28.  Procedures:   None  Antimicrobials:  Merrem    Subjective: Pt still febrile overnight, still feeling generally unwell. No abd pain, chest pain dyspnea.   Objective: Vitals:   08/13/16 0500 08/13/16 0940 08/13/16 1005 08/13/16 1400  BP: (!) 143/85 (!) 144/87 139/84 (!) 143/78  Pulse: (!) 109  91 (!) 103  Resp: 20   18  Temp: 99.2 F (37.3 C)  (!) 102.9 F (39.4 C) 99.8 F (37.7 C)  TempSrc: Oral  Oral Oral  SpO2: 99%  100% 100%  Weight:   47.6 kg (104 lb 15 oz)     Intake/Output Summary (Last 24 hours) at 08/13/16 1526 Last data filed at 08/13/16 1325  Gross per 24 hour  Intake             2220  ml  Output              600 ml  Net             1620 ml   Filed Weights   08/13/16 1005  Weight: 47.6 kg (104 lb 15 oz)    Examination: General exam: 55 y.o. female in no distress appearing older than stated age. Respiratory system: Non-labored breathing room air. Diminished but clear to auscultation bilaterally.  Cardiovascular system: Tachycardic rate and normal rhythm. No murmur, rub, or gallop. No  JVD, and no edema. Gastrointestinal system: Abdomen soft, non-tender, non-distended, with normoactive bowel sounds. No organomegaly or masses felt. Colostomy site on right abdomen nontender with clear brown contents. PEG in left abdomen nontender without discharge.  Central nervous system: Awake, not cooperative with exam. No focal neurological deficits. Extremities: Left AKA, right leg contracted. Skin: Decubitus ulcer not examined.  Psychiatry: UTD  Data Reviewed: I have personally reviewed following labs and imaging studies  CBC:  Recent Labs Lab 08/08/16 0044 08/08/16 0626 08/09/16 0629 08/11/16 0542 08/13/16 0533  WBC 14.6* 12.8* 11.0* 9.6 9.9  NEUTROABS 9.1* 8.8*  --   --  5.2  HGB 10.0* 9.7* 10.3* 10.5* 9.3*  HCT 35.0* 34.3* 36.2 35.7* 32.2*  MCV 103.6* 104.3* 101.7* 100.0 100.6*  PLT 304 283 302 274 244   Basic Metabolic Panel:  Recent Labs Lab 08/08/16 0626 08/09/16 0629 08/10/16 0712 08/11/16 0542 08/12/16 0634 08/13/16 0533  NA 146* 145  --  142 144 145  K 4.6 4.4  --  5.5* 4.7 4.2  CL 113* 111  --  108 110 113*  CO2 29 26  --  27 29 24   GLUCOSE 92 119*  --  114* 127* 120*  BUN 30* 27*  --  19 20 22*  CREATININE 0.33* 0.33*  --  0.34* 0.32* 0.34*  CALCIUM 8.3* 8.3*  --  8.0* 8.0* 8.1*  MG  --   --  2.2  --   --  1.8  PHOS  --   --   --   --   --  2.3*   GFR: Estimated Creatinine Clearance: 59 mL/min (by C-G formula based on SCr of 0.34 mg/dL (L)). Liver Function Tests:  Recent Labs Lab 08/08/16 0044 08/08/16 0626 08/13/16 0533  AST 24 19 31   ALT 15 13* 19  ALKPHOS 86 78 65  BILITOT 0.4 0.3 0.2*  PROT 6.9 6.3* 5.9*  ALBUMIN 1.5* 1.4* 1.3*   No results for input(s): LIPASE, AMYLASE in the last 168 hours.  Recent Labs Lab 08/08/16 0626  AMMONIA 26   Coagulation Profile: No results for input(s): INR, PROTIME in the last 168 hours. Cardiac Enzymes: No results for input(s): CKTOTAL, CKMB, CKMBINDEX, TROPONINI in the last 168 hours. BNP  (last 3 results) No results for input(s): PROBNP in the last 8760 hours. HbA1C: No results for input(s): HGBA1C in the last 72 hours. CBG:  Recent Labs Lab 08/11/16 0904 08/11/16 1742 08/11/16 2046 08/12/16 0758 08/13/16 0809  GLUCAP 124* 120* 107* 124* 132*   Lipid Profile: No results for input(s): CHOL, HDL, LDLCALC, TRIG, CHOLHDL, LDLDIRECT in the last 72 hours. Thyroid Function Tests: No results for input(s): TSH, T4TOTAL, FREET4, T3FREE, THYROIDAB in the last 72 hours. Anemia Panel: No results for input(s): VITAMINB12, FOLATE, FERRITIN, TIBC, IRON, RETICCTPCT in the last 72 hours. Urine analysis:    Component Value Date/Time   COLORURINE AMBER (A) 08/08/2016 0115   APPEARANCEUR CLOUDY (A)  08/08/2016 0115   LABSPEC 1.024 08/08/2016 0115   PHURINE 5.5 08/08/2016 0115   GLUCOSEU NEGATIVE 08/08/2016 0115   HGBUR MODERATE (A) 08/08/2016 0115   BILIRUBINUR NEGATIVE 08/08/2016 0115   KETONESUR NEGATIVE 08/08/2016 0115   PROTEINUR 30 (A) 08/08/2016 0115   NITRITE POSITIVE (A) 08/08/2016 0115   LEUKOCYTESUR MODERATE (A) 08/08/2016 0115   Sepsis Labs: @LABRCNTIP (procalcitonin:4,lacticidven:4)  ) Recent Results (from the past 240 hour(s))  Blood Culture (routine x 2)     Status: None   Collection Time: 08/08/16 12:35 AM  Result Value Ref Range Status   Specimen Description BLOOD RIGHT HAND  Final   Special Requests IN PEDIATRIC BOTTLE  Final   Culture NO GROWTH 5 DAYS  Final   Report Status 08/13/2016 FINAL  Final  Blood Culture (routine x 2)     Status: None   Collection Time: 08/08/16 12:50 AM  Result Value Ref Range Status   Specimen Description BLOOD RIGHT HAND  Final   Special Requests BOTTLES DRAWN AEROBIC AND ANAEROBIC  Final   Culture NO GROWTH 5 DAYS  Final   Report Status 08/13/2016 FINAL  Final  Urine culture     Status: Abnormal   Collection Time: 08/08/16  1:15 AM  Result Value Ref Range Status   Specimen Description URINE, RANDOM  Final    Special Requests NONE  Final   Culture (A)  Final    >=100,000 COLONIES/mL ESCHERICHIA COLI Confirmed Extended Spectrum Beta-Lactamase Producer (ESBL) 50,000 COLONIES/mL PSEUDOMONAS AERUGINOSA    Report Status 08/12/2016 FINAL  Final   Organism ID, Bacteria ESCHERICHIA COLI (A)  Final   Organism ID, Bacteria PSEUDOMONAS AERUGINOSA (A)  Final      Susceptibility   Escherichia coli - MIC*    AMPICILLIN >=32 RESISTANT Resistant     CEFAZOLIN >=64 RESISTANT Resistant     CEFTRIAXONE >=64 RESISTANT Resistant     CIPROFLOXACIN >=4 RESISTANT Resistant     GENTAMICIN <=1 SENSITIVE Sensitive     IMIPENEM <=0.25 SENSITIVE Sensitive     NITROFURANTOIN <=16 SENSITIVE Sensitive     TRIMETH/SULFA <=20 SENSITIVE Sensitive     AMPICILLIN/SULBACTAM >=32 RESISTANT Resistant     PIP/TAZO 8 SENSITIVE Sensitive     Extended ESBL POSITIVE Resistant     * >=100,000 COLONIES/mL ESCHERICHIA COLI   Pseudomonas aeruginosa - MIC*    CEFTAZIDIME 4 SENSITIVE Sensitive     CIPROFLOXACIN 2 INTERMEDIATE Intermediate     GENTAMICIN <=1 SENSITIVE Sensitive     IMIPENEM >=16 RESISTANT Resistant     PIP/TAZO 32 SENSITIVE Sensitive     CEFEPIME 4 SENSITIVE Sensitive     * 50,000 COLONIES/mL PSEUDOMONAS AERUGINOSA     Radiology Studies: No results found.  Scheduled Meds: . aspirin  81 mg Oral Daily  . chlorhexidine  15 mL Mouth Rinse BID  . enoxaparin (LOVENOX) injection  40 mg Subcutaneous Q24H  . escitalopram  10 mg Per Tube Daily  . feeding supplement (PRO-STAT SUGAR FREE 64)  30 mL Per Tube TID WC  . folic acid-pyridoxine-cyancobalamin  1 tablet Per Tube Daily  . fosfomycin  3 g Oral Q72H  . free water  200 mL Per Tube Q8H  . mouth rinse  15 mL Mouth Rinse q12n4p  . metoprolol tartrate  25 mg Per Tube BID  . multivitamin  1 tablet Oral Daily  . pantoprazole sodium  40 mg Per Tube Daily  . polyethylene glycol  17 g Per Tube Daily  . pravastatin  40 mg Per Tube Daily  . Valproate Sodium  200 mg Per  Tube Q8H  . ascorbic acid  500 mg Oral Daily  . zinc sulfate  220 mg Oral Daily   Continuous Infusions: . feeding supplement (OSMOLITE 1.5 CAL) 1,000 mL (08/13/16 0700)     LOS: 5 days   Time spent: 25 minutes.  Hazeline Junkeryan Hawken Bielby, MD [facility] Pager [phone]  If 7PM-7AM, please contact night-coverage [email or web address] Password [facility] 08/13/2016, 3:26 PM

## 2016-08-14 ENCOUNTER — Inpatient Hospital Stay (HOSPITAL_COMMUNITY): Payer: Medicaid Other

## 2016-08-14 LAB — CBC
HEMATOCRIT: 31.9 % — AB (ref 36.0–46.0)
HEMOGLOBIN: 9.2 g/dL — AB (ref 12.0–15.0)
MCH: 29.1 pg (ref 26.0–34.0)
MCHC: 28.8 g/dL — AB (ref 30.0–36.0)
MCV: 100.9 fL — AB (ref 78.0–100.0)
Platelets: 303 10*3/uL (ref 150–400)
RBC: 3.16 MIL/uL — ABNORMAL LOW (ref 3.87–5.11)
RDW: 21.1 % — AB (ref 11.5–15.5)
WBC: 9.9 10*3/uL (ref 4.0–10.5)

## 2016-08-14 LAB — COMPREHENSIVE METABOLIC PANEL
ALBUMIN: 1.4 g/dL — AB (ref 3.5–5.0)
ALT: 22 U/L (ref 14–54)
AST: 29 U/L (ref 15–41)
Alkaline Phosphatase: 63 U/L (ref 38–126)
Anion gap: 5 (ref 5–15)
BUN: 19 mg/dL (ref 6–20)
CHLORIDE: 112 mmol/L — AB (ref 101–111)
CO2: 27 mmol/L (ref 22–32)
Calcium: 8.2 mg/dL — ABNORMAL LOW (ref 8.9–10.3)
Creatinine, Ser: 0.36 mg/dL — ABNORMAL LOW (ref 0.44–1.00)
GFR calc Af Amer: >60 mL/min (ref >60)
GFR calc non Af Amer: >60 mL/min (ref >60)
GLUCOSE: 131 mg/dL — AB (ref 65–99)
POTASSIUM: 4.1 mmol/L (ref 3.5–5.1)
Sodium: 144 mmol/L (ref 135–145)
Total Bilirubin: 0.2 mg/dL — ABNORMAL LOW (ref 0.3–1.2)
Total Protein: 6.3 g/dL — ABNORMAL LOW (ref 6.5–8.1)

## 2016-08-14 LAB — GLUCOSE, CAPILLARY
GLUCOSE-CAPILLARY: 123 mg/dL — AB (ref 65–99)
Glucose-Capillary: 123 mg/dL — ABNORMAL HIGH (ref 65–99)
Glucose-Capillary: 116 mg/dL — ABNORMAL HIGH (ref 65–99)

## 2016-08-14 MED ORDER — IOPAMIDOL (ISOVUE-300) INJECTION 61%
INTRAVENOUS | Status: AC
  Administered 2016-08-14: 100 mL
  Filled 2016-08-14: qty 100

## 2016-08-14 MED ORDER — ACETAMINOPHEN 325 MG PO TABS: 650.0000 mg | ORAL_TABLET | Freq: Once | ORAL | Status: DC

## 2016-08-14 MED ORDER — ACETAMINOPHEN 650 MG RE SUPP
650.0000 mg | Freq: Once | RECTAL | Status: AC
  Administered 2016-08-14: 650 mg via RECTAL

## 2016-08-14 MED ORDER — MEROPENEM 1 G IV SOLR
1.0000 g | Freq: Two times a day (BID) | INTRAVENOUS | Status: DC
  Administered 2016-08-14 – 2016-08-21 (×12): 1 g via INTRAVENOUS
  Filled 2016-08-14 (×19): qty 1

## 2016-08-14 NOTE — Progress Notes (Signed)
Pharmacy Antibiotic Note  Katherine Palmer is a 55 y.o. female admitted on 08/08/2016 to restart meropenem per ID in the setting of Pseudomonas/ESBL E.coli UTI with persistent fever. Continuing course of fosfomycin to cover pseudomonas -  50K CFu unsure of significance. Plan for CT pelvis to r/o pyelo/stones. Tmax 102.3. SCr likely falsely low due to immobility, left AKA, and hemiparesis. WBC WNL as of this morning.   Plan: -Meropenem 1g q12h  -Continue Fosfomycin 3g q72h x3 doses -F/U CT results -Monitor renal fxn, clinical course  Antimicrobials this admission:  11/23 CTX>> 11/23 11/23 ceftaz >> 11/26 11/28 fosfomycin >> (12/7) 11/26 Meropenem >> 11/27; 11/29 >>  Dose adjustments this admission:    Microbiology results:  11/23 urine cx >> > 100,000 colonies ESBL E.Coli, 50,000 colonies Pseudomonas (R-cipro, R-imipenem) 11/23 blood cx >> NGF  Previous Admission: 11/3 (old) urine cx >> 40,000 pseudomonas (R to cipro and imipenem) 10/31-11/4 CTX > 11/4-11/6 cefepime  10/31-11/2 Vanc   Temp (24hrs), Avg:100.9 F (38.3 C), Min:98.8 F (37.1 C), Max:102.3 F (39.1 C)   Recent Labs Lab 08/08/16 0058 08/08/16 0626 08/09/16 0629 08/11/16 0542 08/12/16 0634 08/13/16 0533 08/14/16 0605  WBC  --  12.8* 11.0* 9.6  --  9.9 9.9  CREATININE  --  0.33* 0.33* 0.34* 0.32* 0.34* 0.36*  LATICACIDVEN 1.14  --   --   --   --   --   --     No Known Allergies   Sherle Poeob Vincent, PharmD Clinical Pharmacist 6:01 PM, 08/14/2016

## 2016-08-14 NOTE — Progress Notes (Signed)
PROGRESS NOTE  Katherine AhmadiDiane Palmer  RUE:[id number] DOB: 12/16/1960 DOA: 08/08/2016 PCP: Kirt Boysarter, Monica, DO   Brief Narrative: Patient is a 55 y.o.female with history of CVA with left hemiparesis, with left AKA, chronic right lower extremity contracture, stage IV decub ulcer with colostomy and PEG tube for feeding due to dysphagia, seizure disorder and HTNwas brought to the ER from the skilled nursing facility after patient was found to have altered mental status. She was thought to be encephalopathic from a UTI. She was subsequently admitted for further evaluation and treatment.   Assessment & Plan: Principal Problem:   Acute encephalopathy Active Problems:   Hemiparesis affecting left side as late effect of cerebrovascular accident (CVA) (HCC)   Essential hypertension, benign   Dysphagia due to old cerebrovascular accident   S/P AKA (above knee amputation) (HCC)   Stage IV decubitus ulcer (HCC)   Urinary tract infection associated with indwelling urethral catheter (HCC)   Altered mental status  Sepsis from ESBL E Coli and Pseudomonas UTI: Sepsis improving but not resolving, still febrile but hemodynamically stable. No suspicion for intraabdominal or pulmonary sources at this time. UCx with >100,000 CFU ESBL Escherichia coli and >50,000 CFU of Pseudomonas Aeruginosa. Suspect zosyn (with MIC 32) was inadequately treating infections.  - Pharmacy discussed with ID, Dr. Drue SecondSnider, who recommended fosfomycin (presumed efficacy against ESBL EColi) x 3 days for complicated UTI though patient continues to be febrile - Dr. Drue SecondSnider to see today.  - Has been on zosyn and meropenem this hospitalization. - CT abdomen/pelvis w/contrast.  Acute Metabolic Encephalopathy: EEG not performed due to hair matting.  - Improving.Suspect likely due to UTI  Hyperkalemia:Resolved after dose of Kayexalate; K+ was 4.7 - Repeat BMP in AM  Chronic sacral decubitus ulcer stage IV: Present PTA. See pictures taken on  11/24 by Dr. Jerral RalphGhimire. No active infection at this time.  - Appreciate WOC assistance.  - If Sepsis continues, consider MRI to evaluate for osteomyelitis.   History of CVA with residual left-sided Hemiplegia, Dysarthria and Dysphagia:  -PEG tube in place -Continue Aspirin 81 mg and Statin.   Hypertension:  -Continue with Metoprolol 25 mg po per PEG BID -C/w Hydralazine 10 mg IV q4hprn for SBP > 160   Seizure Disorder:  -Continue Valproate Sodium 200 mg Per PEG q8h: level was < 10 on admission (goal 50-100)  Depression -C/w Escitalopram 10 mg Per PEG Daily  S/P Left AKA (above knee amputation)  DVT prophylaxis: Lovenox Code Status: Full Family Communication: None at bedside this AM Disposition Plan: SNF once sepsis resolved, requiring IV antibiotics.   Consultants:   Infectious disease, Dr. Ninetta LightsHatcher by phone 11/27 and Dr. Drue SecondSnider by phone 11/28.  ID, Dr. Drue SecondSnider formally consulted 11/29.  Procedures:   None  Antimicrobials:  Ceftriaxone 11/23  Ceftazidime 11/23 > 11/26  Meropenem 11/26 > 11/28  Zosyn 11/27 > 11/28  Fosfomycin 11/28 >>   Subjective: Pt still febrile today, still feeling generally unwell. No abd pain, chest pain, dyspnea.   Objective: Vitals:   08/14/16 0056 08/14/16 0701 08/14/16 1430 08/14/16 1628  BP: 139/87 135/76 (!) 148/80   Pulse: (!) 114 (!) 107 (!) 110   Resp: 17 17 15    Temp:  98.8 F (37.1 C) (!) 101.5 F (38.6 C) (!) 102.3 F (39.1 C)  TempSrc:  Oral Oral Rectal  SpO2: 98% 100% 98%   Weight:        Intake/Output Summary (Last 24 hours) at 08/14/16 1635 Last data filed at 08/14/16 1624  Gross per 24 hour  Intake             1667 ml  Output             1279 ml  Net              388 ml   Filed Weights   08/13/16 1005  Weight: 47.6 kg (104 lb 15 oz)    Examination: General exam: 55 y.o. female in no distress appearing older than stated age. Respiratory system: Non-labored breathing room air. Diminished but clear to  auscultation bilaterally.  Cardiovascular system: Tachycardic rate and normal rhythm. No murmur, rub, or gallop. No JVD, and no edema. Gastrointestinal system: Abdomen soft, non-tender, non-distended, with normoactive bowel sounds. No organomegaly or masses felt. Colostomy site on right abdomen nontender with clear brown contents. PEG in left abdomen nontender without discharge.  Central nervous system: Awake, not oriented, doesn't believe she's in the hospital. No focal neurological deficits. Extremities: Left high AKA, right leg contracted. Skin: Decubitus ulcer with c/d/i dressing pad not removed.  Psychiatry: UTD  Data Reviewed: I have personally reviewed following labs and imaging studies  CBC:  Recent Labs Lab 08/08/16 0044 08/08/16 0626 08/09/16 0629 08/11/16 0542 08/13/16 0533 08/14/16 0605  WBC 14.6* 12.8* 11.0* 9.6 9.9 9.9  NEUTROABS 9.1* 8.8*  --   --  5.2  --   HGB 10.0* 9.7* 10.3* 10.5* 9.3* 9.2*  HCT 35.0* 34.3* 36.2 35.7* 32.2* 31.9*  MCV 103.6* 104.3* 101.7* 100.0 100.6* 100.9*  PLT 304 283 302 274 244 303   Basic Metabolic Panel:  Recent Labs Lab 08/09/16 0629 08/10/16 0712 08/11/16 0542 08/12/16 0634 08/13/16 0533 08/14/16 0605  NA 145  --  142 144 145 144  K 4.4  --  5.5* 4.7 4.2 4.1  CL 111  --  108 110 113* 112*  CO2 26  --  27 29 24 27   GLUCOSE 119*  --  114* 127* 120* 131*  BUN 27*  --  19 20 22* 19  CREATININE 0.33*  --  0.34* 0.32* 0.34* 0.36*  CALCIUM 8.3*  --  8.0* 8.0* 8.1* 8.2*  MG  --  2.2  --   --  1.8  --   PHOS  --   --   --   --  2.3*  --    GFR: Estimated Creatinine Clearance: 59 mL/min (by C-G formula based on SCr of 0.36 mg/dL (L)). Liver Function Tests:  Recent Labs Lab 08/08/16 0044 08/08/16 0626 08/13/16 0533 08/14/16 0605  AST 24 19 31 29   ALT 15 13* 19 22  ALKPHOS 86 78 65 63  BILITOT 0.4 0.3 0.2* 0.2*  PROT 6.9 6.3* 5.9* 6.3*  ALBUMIN 1.5* 1.4* 1.3* 1.4*   No results for input(s): LIPASE, AMYLASE in the last  168 hours.  Recent Labs Lab 08/08/16 0626  AMMONIA 26   CBG:  Recent Labs Lab 08/12/16 0758 08/13/16 0809 08/13/16 1626 08/14/16 0215 08/14/16 0747  GLUCAP 124* 132* 111* 123* 116*   Lipid Profile: No results for input(s): CHOL, HDL, LDLCALC, TRIG, CHOLHDL, LDLDIRECT in the last 72 hours. Thyroid Function Tests: No results for input(s): TSH, T4TOTAL, FREET4, T3FREE, THYROIDAB in the last 72 hours. Anemia Panel: No results for input(s): VITAMINB12, FOLATE, FERRITIN, TIBC, IRON, RETICCTPCT in the last 72 hours. Urine analysis:    Component Value Date/Time   COLORURINE AMBER (A) 08/08/2016 0115   APPEARANCEUR CLOUDY (A) 08/08/2016 0115   LABSPEC 1.024 08/08/2016  0115   PHURINE 5.5 08/08/2016 0115   GLUCOSEU NEGATIVE 08/08/2016 0115   HGBUR MODERATE (A) 08/08/2016 0115   BILIRUBINUR NEGATIVE 08/08/2016 0115   KETONESUR NEGATIVE 08/08/2016 0115   PROTEINUR 30 (A) 08/08/2016 0115   NITRITE POSITIVE (A) 08/08/2016 0115   LEUKOCYTESUR MODERATE (A) 08/08/2016 0115   Sepsis Labs: @LABRCNTIP (procalcitonin:4,lacticidven:4)  ) Recent Results (from the past 240 hour(s))  Blood Culture (routine x 2)     Status: None   Collection Time: 08/08/16 12:35 AM  Result Value Ref Range Status   Specimen Description BLOOD RIGHT HAND  Final   Special Requests IN PEDIATRIC BOTTLE  Final   Culture NO GROWTH 5 DAYS  Final   Report Status 08/13/2016 FINAL  Final  Blood Culture (routine x 2)     Status: None   Collection Time: 08/08/16 12:50 AM  Result Value Ref Range Status   Specimen Description BLOOD RIGHT HAND  Final   Special Requests BOTTLES DRAWN AEROBIC AND ANAEROBIC  Final   Culture NO GROWTH 5 DAYS  Final   Report Status 08/13/2016 FINAL  Final  Urine culture     Status: Abnormal   Collection Time: 08/08/16  1:15 AM  Result Value Ref Range Status   Specimen Description URINE, RANDOM  Final   Special Requests NONE  Final   Culture (A)  Final    >=100,000 COLONIES/mL  ESCHERICHIA COLI Confirmed Extended Spectrum Beta-Lactamase Producer (ESBL) 50,000 COLONIES/mL PSEUDOMONAS AERUGINOSA    Report Status 08/12/2016 FINAL  Final   Organism ID, Bacteria ESCHERICHIA COLI (A)  Final   Organism ID, Bacteria PSEUDOMONAS AERUGINOSA (A)  Final      Susceptibility   Escherichia coli - MIC*    AMPICILLIN >=32 RESISTANT Resistant     CEFAZOLIN >=64 RESISTANT Resistant     CEFTRIAXONE >=64 RESISTANT Resistant     CIPROFLOXACIN >=4 RESISTANT Resistant     GENTAMICIN <=1 SENSITIVE Sensitive     IMIPENEM <=0.25 SENSITIVE Sensitive     NITROFURANTOIN <=16 SENSITIVE Sensitive     TRIMETH/SULFA <=20 SENSITIVE Sensitive     AMPICILLIN/SULBACTAM >=32 RESISTANT Resistant     PIP/TAZO 8 SENSITIVE Sensitive     Extended ESBL POSITIVE Resistant     * >=100,000 COLONIES/mL ESCHERICHIA COLI   Pseudomonas aeruginosa - MIC*    CEFTAZIDIME 4 SENSITIVE Sensitive     CIPROFLOXACIN 2 INTERMEDIATE Intermediate     GENTAMICIN <=1 SENSITIVE Sensitive     IMIPENEM >=16 RESISTANT Resistant     PIP/TAZO 32 SENSITIVE Sensitive     CEFEPIME 4 SENSITIVE Sensitive     * 50,000 COLONIES/mL PSEUDOMONAS AERUGINOSA     Radiology Studies: No results found.  Scheduled Meds: . aspirin  81 mg Oral Daily  . chlorhexidine  15 mL Mouth Rinse BID  . enoxaparin (LOVENOX) injection  40 mg Subcutaneous Q24H  . escitalopram  10 mg Per Tube Daily  . feeding supplement (PRO-STAT SUGAR FREE 64)  30 mL Per Tube TID WC  . folic acid-pyridoxine-cyancobalamin  1 tablet Per Tube Daily  . fosfomycin  3 g Oral Q72H  . free water  200 mL Per Tube Q8H  . mouth rinse  15 mL Mouth Rinse q12n4p  . metoprolol tartrate  25 mg Per Tube BID  . multivitamin  1 tablet Oral Daily  . pantoprazole sodium  40 mg Per Tube Daily  . polyethylene glycol  17 g Per Tube Daily  . pravastatin  40 mg Per Tube Daily  .  Valproate Sodium  200 mg Per Tube Q8H  . ascorbic acid  500 mg Oral Daily  . zinc sulfate  220 mg Oral  Daily   Continuous Infusions: . feeding supplement (OSMOLITE 1.5 CAL) 1,000 mL (08/14/16 1307)     LOS: 6 days   Time spent: 25 minutes.  Hazeline Junkeryan Darrly Loberg, MD [facility] Pager [phone]  If 7PM-7AM, please contact night-coverage [email or web address] Password [email or web address] 08/14/2016, 4:35 PM

## 2016-08-14 NOTE — Care Management Note (Addendum)
Case Management Note  Patient Details  Name: Felix AhmadiDiane Kanner MRN: [id number] Date of Birth: 12-17-1960  Subjective/Objective:         Admitted with AMS,  history of CVA with left hemiparesis, with left AKA, chronic right lower extremity contracture, stage IV decub ulcer with colostomy and PEG tube (dysphagia), seizure disorder and HTN. [facility]  Mariam DollarKatawbba Molstad (Daughter) Delane GingerKeticla Gural (Daughter)    [phone]     PCP: Kirt BoysMonica Carter  Action/Plan:  Palliative consult pending to determine GOC .Marland Kitchen.... CM following disposition needs.  Expected Discharge Date:                  Expected Discharge Plan:  Skilled Nursing Facility [facility]([place]  In-House Referral:  Clinical Social Work  Discharge planning Services  CM Consult  Post Acute Care Choice:    Choice offered to:     DME Arranged:    DME Agency:     HH Arranged:    HH Agency:     Status of Service:  In process, will continue to follow  If discussed at Long Length of Stay Meetings, dates discussed:    Additional Comments:  Epifanio LeschesCole, Jolisa Intriago Hudson, RN 08/14/2016, 2:53 PM

## 2016-08-14 NOTE — Consult Note (Signed)
[place] for Infectious Disease  Total days of antibiotics 8               Reason for Consult: fevers    Referring Physician: grunz  Principal Problem:   Acute encephalopathy Active Problems:   Hemiparesis affecting left side as late effect of cerebrovascular accident (CVA) ([place])   Essential hypertension, benign   Dysphagia due to old cerebrovascular accident   S/P AKA (above knee amputation) ([place])   Stage IV decubitus ulcer ([place])   Urinary tract infection associated with indwelling urethral catheter ([place])   Altered mental status    HPI: Katherine Palmer is a 55 y.o. female has PMHX of CVA with left hemiparesis, s/p Left AKA, spasticity with right leg contracture, c/b stage 4 decub ulcer, s/p PEG, s/p colostomy, hx of seizure disorder. She was admitted on 11/23 from her SNF for worsening encephalopathy. She had recently been discharged from the hospital 3 weeks prior for hyperkalemia but also treated for PsA UTI through 11/13. On this admit, she was found to be febrile, 102F, and leukocytosis of 12.8K. Empirically started on ceftaz for presumed uti. Her sacral wounds did not look infected per admit's note. Her blood cx were NGTD, her urine cx grew an ESBL ecoli S to gent, imi, bactrim, and nitrofurantoin. PsA S to cefepime/ceftaz and gent. Due to the patient having increase in fever, her ceftaz was changed to meropenem plus piptazo to cover PsA. For the past 48hr she is having ongoing fevers of 102-103F. WBC stable at 10K. The patient remains hemodynamically stable  Past Medical History:  Diagnosis Date  . Decubitus ulcer of sacral region, stage 4 ([place])   . Depression   . Dysphagia   . Dysphagia as late effect of cerebrovascular disease   . Hyperlipidemia   . Hypertension   . Seizures ([place])   . Stroke [facility])     Allergies: No Known Allergies  MEDICATIONS: . aspirin  81 mg Oral Daily  . chlorhexidine  15 mL Mouth Rinse BID  . enoxaparin (LOVENOX) injection  40 mg  Subcutaneous Q24H  . escitalopram  10 mg Per Tube Daily  . feeding supplement (PRO-STAT SUGAR FREE 64)  30 mL Per Tube TID WC  . folic acid-pyridoxine-cyancobalamin  1 tablet Per Tube Daily  . fosfomycin  3 g Oral Q72H  . free water  200 mL Per Tube Q8H  . mouth rinse  15 mL Mouth Rinse q12n4p  . metoprolol tartrate  25 mg Per Tube BID  . multivitamin  1 tablet Oral Daily  . pantoprazole sodium  40 mg Per Tube Daily  . polyethylene glycol  17 g Per Tube Daily  . pravastatin  40 mg Per Tube Daily  . Valproate Sodium  200 mg Per Tube Q8H  . ascorbic acid  500 mg Oral Daily  . zinc sulfate  220 mg Oral Daily    Social History  Substance Use Topics  . Smoking status: Former Research scientist (life sciences)  . Smokeless tobacco: Never Used  . Alcohol use No    Family History  Problem Relation Age of Onset  . Hypertension Other     Review of Systems -  Unable to obtain due to encephalopathy  OBJECTIVE: Temp:  [98.8 F (37.1 C)-102.3 F (39.1 C)] 102.3 F (39.1 C) (11/29 1628) Pulse Rate:  [107-114] 110 (11/29 1430) Resp:  [15-17] 15 (11/29 1430) BP: (135-148)/(76-87) 148/80 (11/29 1430) SpO2:  [98 %-100 %] 98 % (11/29 1430) Physical Exam  Constitutional:  oriented to person, place, and time. appears chronically ill in no distress HENT: [place]/AT, PERRLA, no scleral icterus Mouth/Throat: Oropharynx is clear and moist. No oropharyngeal exudate.  Cardiovascular: Normal rate, regular rhythm and normal heart sounds. Exam reveals no gallop and no friction rub.  No murmur heard.  Pulmonary/Chest: Effort normal and breath sounds normal. No respiratory distress.  has no wheezes.  Neck = supple, no nuchal rigidity Abdominal: Soft. Bowel sounds are normal.  exhibits no distension. There is no tenderness.  Lymphadenopathy: no cervical adenopathy. No axillary adenopathy Neurological: alert and oriented to person, only. Right lower extremity contracture. Decrease strength to left arm/hand Ext: left aka Skin: Skin  is warm and dry. No rash noted. No erythema.     LABS: Results for orders placed or performed during the hospital encounter of 08/08/16 (from the past 48 hour(s))  CBC with Differential/Platelet     Status: Abnormal   Collection Time: 08/13/16  5:33 AM  Result Value Ref Range   WBC 9.9 4.0 - 10.5 K/uL   RBC 3.20 (L) 3.87 - 5.11 MIL/uL   Hemoglobin 9.3 (L) 12.0 - 15.0 g/dL   HCT 32.2 (L) 36.0 - 46.0 %   MCV 100.6 (H) 78.0 - 100.0 fL   MCH 29.1 26.0 - 34.0 pg   MCHC 28.9 (L) 30.0 - 36.0 g/dL   RDW 21.6 (H) 11.5 - 15.5 %   Platelets 244 150 - 400 K/uL   Neutrophils Relative % 53 %   Lymphocytes Relative 22 %   Monocytes Relative 14 %   Eosinophils Relative 11 %   Basophils Relative 0 %   Neutro Abs 5.2 1.7 - 7.7 K/uL   Lymphs Abs 2.2 0.7 - 4.0 K/uL   Monocytes Absolute 1.4 (H) 0.1 - 1.0 K/uL   Eosinophils Absolute 1.1 (H) 0.0 - 0.7 K/uL   Basophils Absolute 0.0 0.0 - 0.1 K/uL   RBC Morphology POLYCHROMASIA PRESENT     Comment: TARGET CELLS  Comprehensive metabolic panel     Status: Abnormal   Collection Time: 08/13/16  5:33 AM  Result Value Ref Range   Sodium 145 135 - 145 mmol/L   Potassium 4.2 3.5 - 5.1 mmol/L   Chloride 113 (H) 101 - 111 mmol/L   CO2 24 22 - 32 mmol/L   Glucose, Bld 120 (H) 65 - 99 mg/dL   BUN 22 (H) 6 - 20 mg/dL   Creatinine, Ser 0.34 (L) 0.44 - 1.00 mg/dL   Calcium 8.1 (L) 8.9 - 10.3 mg/dL   Total Protein 5.9 (L) 6.5 - 8.1 g/dL   Albumin 1.3 (L) 3.5 - 5.0 g/dL   AST 31 15 - 41 U/L   ALT 19 14 - 54 U/L   Alkaline Phosphatase 65 38 - 126 U/L   Total Bilirubin 0.2 (L) 0.3 - 1.2 mg/dL   GFR calc non Af Amer >60 >60 mL/min   GFR calc Af Amer >60 >60 mL/min    Comment: (NOTE) The eGFR has been calculated using the CKD EPI equation. This calculation has not been validated in all clinical situations. eGFR's persistently <60 mL/min signify possible Chronic Kidney Disease.    Anion gap 8 5 - 15  Magnesium     Status: None   Collection Time: 08/13/16   5:33 AM  Result Value Ref Range   Magnesium 1.8 1.7 - 2.4 mg/dL  Phosphorus     Status: Abnormal   Collection Time: 08/13/16  5:33 AM  Result Value Ref  Range   Phosphorus 2.3 (L) 2.5 - 4.6 mg/dL  Glucose, capillary     Status: Abnormal   Collection Time: 08/13/16  8:09 AM  Result Value Ref Range   Glucose-Capillary 132 (H) 65 - 99 mg/dL  Glucose, capillary     Status: Abnormal   Collection Time: 08/13/16  4:26 PM  Result Value Ref Range   Glucose-Capillary 111 (H) 65 - 99 mg/dL  Glucose, capillary     Status: Abnormal   Collection Time: 08/14/16  2:15 AM  Result Value Ref Range   Glucose-Capillary 123 (H) 65 - 99 mg/dL  Comprehensive metabolic panel     Status: Abnormal   Collection Time: 08/14/16  6:05 AM  Result Value Ref Range   Sodium 144 135 - 145 mmol/L   Potassium 4.1 3.5 - 5.1 mmol/L   Chloride 112 (H) 101 - 111 mmol/L   CO2 27 22 - 32 mmol/L   Glucose, Bld 131 (H) 65 - 99 mg/dL   BUN 19 6 - 20 mg/dL   Creatinine, Ser 0.36 (L) 0.44 - 1.00 mg/dL   Calcium 8.2 (L) 8.9 - 10.3 mg/dL   Total Protein 6.3 (L) 6.5 - 8.1 g/dL   Albumin 1.4 (L) 3.5 - 5.0 g/dL   AST 29 15 - 41 U/L   ALT 22 14 - 54 U/L   Alkaline Phosphatase 63 38 - 126 U/L   Total Bilirubin 0.2 (L) 0.3 - 1.2 mg/dL   GFR calc non Af Amer >60 >60 mL/min   GFR calc Af Amer >60 >60 mL/min    Comment: (NOTE) The eGFR has been calculated using the CKD EPI equation. This calculation has not been validated in all clinical situations. eGFR's persistently <60 mL/min signify possible Chronic Kidney Disease.    Anion gap 5 5 - 15  CBC     Status: Abnormal   Collection Time: 08/14/16  6:05 AM  Result Value Ref Range   WBC 9.9 4.0 - 10.5 K/uL   RBC 3.16 (L) 3.87 - 5.11 MIL/uL   Hemoglobin 9.2 (L) 12.0 - 15.0 g/dL   HCT 31.9 (L) 36.0 - 46.0 %   MCV 100.9 (H) 78.0 - 100.0 fL   MCH 29.1 26.0 - 34.0 pg   MCHC 28.8 (L) 30.0 - 36.0 g/dL   RDW 21.1 (H) 11.5 - 15.5 %   Platelets 303 150 - 400 K/uL  Glucose, capillary      Status: Abnormal   Collection Time: 08/14/16  7:47 AM  Result Value Ref Range   Glucose-Capillary 116 (H) 65 - 99 mg/dL  Glucose, capillary     Status: Abnormal   Collection Time: 08/14/16  4:39 PM  Result Value Ref Range   Glucose-Capillary 123 (H) 65 - 99 mg/dL    MICRO: 11/23 urine cx esbl ecoli plus drug resistant PsA 11/23 blood cx ngtd IMAGING: No results found.   Assessment/Plan:  Lewis is a 55yo F with hx of stroke who was recently treated for PsA UTI who is now admitted for encephalopathy found to have ecoli/psa uti.  uti = unclear if that was collected from indwelling foley which may be colonized with PsA. Plan to change abtx back to meropenem to cover ESBL. Can keep fosfomycin for Q3d to cover PsA. Change foley if it has not already been done. Fosfomycin technically should not be used for ESBL ecoli if you think it is relate to complicated disease.   Ongoing fever =  - recommend to try to find  nidus, to see if she has pyelonephritis or kidney stones via CT scan - may consider to get mri of pelvis to look for sacral osteo  Will continue to provide further recs tomorrow

## 2016-08-15 ENCOUNTER — Inpatient Hospital Stay (HOSPITAL_COMMUNITY): Payer: Medicaid Other

## 2016-08-15 LAB — BASIC METABOLIC PANEL
ANION GAP: 6 (ref 5–15)
BUN: 21 mg/dL — ABNORMAL HIGH (ref 6–20)
CALCIUM: 8.3 mg/dL — AB (ref 8.9–10.3)
CO2: 22 mmol/L (ref 22–32)
Chloride: 112 mmol/L — ABNORMAL HIGH (ref 101–111)
Creatinine, Ser: <0.3 mg/dL — ABNORMAL LOW (ref 0.44–1.00)
Glucose, Bld: 135 mg/dL — ABNORMAL HIGH (ref 65–99)
POTASSIUM: 4.1 mmol/L (ref 3.5–5.1)
Sodium: 140 mmol/L (ref 135–145)

## 2016-08-15 LAB — GLUCOSE, CAPILLARY: Glucose-Capillary: 95 mg/dL (ref 65–99)

## 2016-08-15 LAB — CBC
HEMATOCRIT: 34.1 % — AB (ref 36.0–46.0)
Hemoglobin: 9.8 g/dL — ABNORMAL LOW (ref 12.0–15.0)
MCH: 29 pg (ref 26.0–34.0)
MCHC: 28.7 g/dL — ABNORMAL LOW (ref 30.0–36.0)
MCV: 100.9 fL — ABNORMAL HIGH (ref 78.0–100.0)
Platelets: 331 10*3/uL (ref 150–400)
RBC: 3.38 MIL/uL — AB (ref 3.87–5.11)
RDW: 20.5 % — AB (ref 11.5–15.5)
WBC: 10 10*3/uL (ref 4.0–10.5)

## 2016-08-15 LAB — LIPASE, BLOOD: LIPASE: 23 U/L (ref 11–51)

## 2016-08-15 LAB — C-REACTIVE PROTEIN: CRP: 1.3 mg/dL — ABNORMAL HIGH (ref <1.0)

## 2016-08-15 LAB — SEDIMENTATION RATE: SED RATE: 94 mm/h — AB (ref 0–22)

## 2016-08-15 NOTE — Progress Notes (Addendum)
Notified Dr Jarvis NewcomerGrunz via Loretha Stapleramion that Patient had 5 beat of v tach nonsustained. Patient resting comfortably in bed unchanged form previous hourly round.

## 2016-08-15 NOTE — Progress Notes (Signed)
Nutrition Follow-up  DOCUMENTATION CODES:   Not applicable  INTERVENTION:   Continue Osmolite 1.5 @ 60 ml/hr via PEG   30 ml Prostat TID   Tube feeding regimen provides 2460 kcals, 135 grams protein, and 1097 ml fluid daily (which meets >100% of estimated kcal and protein needs).   NUTRITION DIAGNOSIS:   Increased nutrient needs related to wound healing as evidenced by estimated needs.  Ongoing  GOAL:   Patient will meet greater than or equal to 90% of their needs  Met with TF  MONITOR:   Diet advancement, Labs, Weight trends, TF tolerance, Skin, I & O's  REASON FOR ASSESSMENT:   Malnutrition Screening Tool, Other (Comment) (new TF)    ASSESSMENT:   Katherine Palmer is a 55 y.o. female with history of CVA with left hemiparesis, with left AKA, stage IV decub ulcer with colostomy and PEG tube for feeding due to dysphagia, seizure disorder and HTN was brought to the ER from the skilled nursing facility after patient was found to be confused and encephalopathic. In the ER patient was found to have leukocytosis with fever. UA is compatible with UTI. On exam patient is only oriented to her name. No family at the bedside and baseline mental status not known.  Pt sleeping soundly at time of visit; RD did not disturb.   Reviewed CWOCN note from 08/15/16; wounds remain unchanged, however, recommending MRI to rule out osteomyelitis.   Osmolite 1.5 is currently infusing @ 60 ml/hr via PEG. Pt also receiving 30 ml Prostat TID. Complete regimen provides 2460 kcals, 135 grams protein, and 1097 ml fluid daily (which meets >100% of estimated kcal and protein needs).   Pt with persistent fevers; ID following.   Reviewed wt, which remains stable since last visit.   Medications reviewed and include MVI, vitam C, and zinc.   Labs reviewed.   Diet Order:   NPO  Skin:  Wound (see comment) (st IV sacrum/buttock/rt trochanter, FT middle back)  Last BM:  08/15/16  Height:   Ht  Readings from Last 1 Encounters:  07/23/16 _0  (1.6 m)    Weight:   Wt Readings from Last 1 Encounters:  08/13/16 104 lb 15 oz (47.6 kg)    Ideal Body Weight:  48.1 kg  BMI:  Body mass index is 18.59 kg/m.  Estimated Nutritional Needs:   Kcal:  1700-1900  Protein:  100-120 grams  Fluid:  >1.7 L  EDUCATION NEEDS:   No education needs identified at this time  Arneshia Ade A. Jimmye Norman, RD, LDN, CDE Pager: [phone] After hours Pager: [phone]

## 2016-08-15 NOTE — Progress Notes (Signed)
PROGRESS NOTE  Katherine Palmer  ZOX:[id number] DOB: [birth year] DOA: 08/08/2016 PCP: Kirt Boys, DO   Brief Narrative: Patient is a 55 y.o.female with history of CVA with left hemiparesis, with left AKA, chronic right lower extremity contracture, stage IV decub ulcer with colostomy and PEG tube for feeding due to dysphagia, seizure disorder and HTNwas brought to the ER from the skilled nursing facility after patient was found to have altered mental status. She was thought to be encephalopathic from a UTI. She was subsequently admitted for further evaluation and treatment.   Assessment & Plan: Principal Problem:   Acute encephalopathy Active Problems:   Hemiparesis affecting left side as late effect of cerebrovascular accident (CVA) (HCC)   Essential hypertension, benign   Dysphagia due to old cerebrovascular accident   S/P AKA (above knee amputation) (HCC)   Stage IV decubitus ulcer (HCC)   Urinary tract infection associated with indwelling urethral catheter (HCC)   Altered mental status  Sepsis from ESBL E Coli and Pseudomonas pyelonephritis: Sepsis improving but not resolving, still febrile but hemodynamically stable. CT showed left perinephric stranding as well as concern for LLL consolidation though suspicion for PNA remains low. UCx with >100,000 CFU ESBL Escherichia coli and >50,000 CFU of Pseudomonas Aeruginosa. Suspect zosyn (with MIC 32) was inadequately treating infections.  - ID consulted, restarted meropenem  Chronic sacral decubitus ulcer stage IV: Present PTA. See pictures taken on 11/24 by Dr. Jerral Ralph. - Appreciate WOC assistance: wounds unchanged since admission with exposed bone - Check MRI  Biliary and pancreatic duct dilitation: With normal lipase and LFTs, doubt this is of clinical significance.   Acute Metabolic Encephalopathy: EEG not performed due to hair matting.  - Improving.Suspect likely due to UTI  Hyperkalemia:Resolved after dose of Kayexalate; K+  was 4.7 - Repeat BMP in AM  History of CVA with residual left-sided Hemiplegia, Dysarthria and Dysphagia:  -PEG tube in place -Continue Aspirin 81 mg and Statin.   Hypertension:  -Continue with Metoprolol 25 mg po per PEG BID -C/w Hydralazine 10 mg IV q4hprn for SBP > 160   Seizure Disorder:  -Continue Valproate Sodium 200 mg Per PEG q8h: level was < 10 on admission (goal 50-100)  Depression -C/w Escitalopram 10 mg Per PEG Daily  S/P Left AKA (above knee amputation)  DVT prophylaxis: Lovenox Code Status: Full Family Communication: None at bedside this AM Disposition Plan: SNF once sepsis resolved, requiring IV antibiotics.   Consultants:   Infectious disease, Dr. Ninetta Lights by phone 11/27 and Dr. Drue Second by phone 11/28.  ID, Dr. Drue Second formally consulted 11/29.  Procedures:   None  Antimicrobials:  Ceftriaxone 11/23  Ceftazidime 11/23 > 11/26  Meropenem 11/26 > 11/28  Zosyn 11/27 > 11/28  Fosfomycin 11/28 >>   Subjective: Pt reports "my butt hurts." No chest pain, cough, wheezing, dyspnea.   Objective: Vitals:   08/14/16 2048 08/15/16 0130 08/15/16 0634 08/15/16 1141  BP:   137/90   Pulse:   (!) 101   Resp:   18   Temp: (!) 101.3 F (38.5 C) 98.8 F (37.1 C) 98.2 F (36.8 C) 98.5 F (36.9 C)  TempSrc: Rectal  Oral Oral  SpO2:   100%   Weight:        Intake/Output Summary (Last 24 hours) at 08/15/16 1705 Last data filed at 08/15/16 1422  Gross per 24 hour  Intake             1156 ml  Output  1400 ml  Net             -244 ml   Filed Weights   08/13/16 1005  Weight: 47.6 kg (104 lb 15 oz)    Examination: General exam: 55 y.o. female in no distress appearing older than stated age. Respiratory system: Non-labored breathing room air. Diminished but clear to auscultation bilaterally.  Cardiovascular system: Tachycardic rate and normal rhythm. No murmur, rub, or gallop. No JVD, and no edema. Gastrointestinal system: Abdomen soft,  non-tender, non-distended, with normoactive bowel sounds. No organomegaly or masses felt. Colostomy site on right abdomen nontender with clear brown contents. PEG in left abdomen nontender without discharge.  Central nervous system: Awake, not oriented, doesn't believe she's in the hospital. No focal neurological deficits. Extremities: Left high AKA, right leg contracted. Skin: Decubitus ulcer with c/d/i dressing pad not removed.  Psychiatry: UTD  Data Reviewed: I have personally reviewed following labs and imaging studies  CBC:  Recent Labs Lab 08/09/16 0629 08/11/16 0542 08/13/16 0533 08/14/16 0605 08/15/16 0524  WBC 11.0* 9.6 9.9 9.9 10.0  NEUTROABS  --   --  5.2  --   --   HGB 10.3* 10.5* 9.3* 9.2* 9.8*  HCT 36.2 35.7* 32.2* 31.9* 34.1*  MCV 101.7* 100.0 100.6* 100.9* 100.9*  PLT 302 274 244 303 331   Basic Metabolic Panel:  Recent Labs Lab 08/10/16 0712 08/11/16 0542 08/12/16 0634 08/13/16 0533 08/14/16 0605 08/15/16 0524  NA  --  142 144 145 144 140  K  --  5.5* 4.7 4.2 4.1 4.1  CL  --  108 110 113* 112* 112*  CO2  --  27 29 24 27 22   GLUCOSE  --  114* 127* 120* 131* 135*  BUN  --  19 20 22* 19 21*  CREATININE  --  0.34* 0.32* 0.34* 0.36* <0.30*  CALCIUM  --  8.0* 8.0* 8.1* 8.2* 8.3*  MG 2.2  --   --  1.8  --   --   PHOS  --   --   --  2.3*  --   --    GFR: CrCl cannot be calculated (This lab value cannot be used to calculate CrCl because it is not a number: <0.30). Liver Function Tests:  Recent Labs Lab 08/13/16 0533 08/14/16 0605  AST 31 29  ALT 19 22  ALKPHOS 65 63  BILITOT 0.2* 0.2*  PROT 5.9* 6.3*  ALBUMIN 1.3* 1.4*    Recent Labs Lab 08/15/16 0836  LIPASE 23   No results for input(s): AMMONIA in the last 168 hours. CBG:  Recent Labs Lab 08/13/16 1626 08/14/16 0215 08/14/16 0747 08/14/16 1639 08/15/16 0220  GLUCAP 111* 123* 116* 123* 95   Lipid Profile: No results for input(s): CHOL, HDL, LDLCALC, TRIG, CHOLHDL, LDLDIRECT in  the last 72 hours. Thyroid Function Tests: No results for input(s): TSH, T4TOTAL, FREET4, T3FREE, THYROIDAB in the last 72 hours. Anemia Panel: No results for input(s): VITAMINB12, FOLATE, FERRITIN, TIBC, IRON, RETICCTPCT in the last 72 hours. Urine analysis:    Component Value Date/Time   COLORURINE AMBER (A) 08/08/2016 0115   APPEARANCEUR CLOUDY (A) 08/08/2016 0115   LABSPEC 1.024 08/08/2016 0115   PHURINE 5.5 08/08/2016 0115   GLUCOSEU NEGATIVE 08/08/2016 0115   HGBUR MODERATE (A) 08/08/2016 0115   BILIRUBINUR NEGATIVE 08/08/2016 0115   KETONESUR NEGATIVE 08/08/2016 0115   PROTEINUR 30 (A) 08/08/2016 0115   NITRITE POSITIVE (A) 08/08/2016 0115   LEUKOCYTESUR MODERATE (A) 08/08/2016 0115  Sepsis Labs: @LABRCNTIP (procalcitonin:4,lacticidven:4)  ) Recent Results (from the past 240 hour(s))  Blood Culture (routine x 2)     Status: None   Collection Time: 08/08/16 12:35 AM  Result Value Ref Range Status   Specimen Description BLOOD RIGHT HAND  Final   Special Requests IN PEDIATRIC BOTTLE  Final   Culture NO GROWTH 5 DAYS  Final   Report Status 08/13/2016 FINAL  Final  Blood Culture (routine x 2)     Status: None   Collection Time: 08/08/16 12:50 AM  Result Value Ref Range Status   Specimen Description BLOOD RIGHT HAND  Final   Special Requests BOTTLES DRAWN AEROBIC AND ANAEROBIC  Final   Culture NO GROWTH 5 DAYS  Final   Report Status 08/13/2016 FINAL  Final  Urine culture     Status: Abnormal   Collection Time: 08/08/16  1:15 AM  Result Value Ref Range Status   Specimen Description URINE, RANDOM  Final   Special Requests NONE  Final   Culture (A)  Final    >=100,000 COLONIES/mL ESCHERICHIA COLI Confirmed Extended Spectrum Beta-Lactamase Producer (ESBL) 50,000 COLONIES/mL PSEUDOMONAS AERUGINOSA    Report Status 08/12/2016 FINAL  Final   Organism ID, Bacteria ESCHERICHIA COLI (A)  Final   Organism ID, Bacteria PSEUDOMONAS AERUGINOSA (A)  Final       Susceptibility   Escherichia coli - MIC*    AMPICILLIN >=32 RESISTANT Resistant     CEFAZOLIN >=64 RESISTANT Resistant     CEFTRIAXONE >=64 RESISTANT Resistant     CIPROFLOXACIN >=4 RESISTANT Resistant     GENTAMICIN <=1 SENSITIVE Sensitive     IMIPENEM <=0.25 SENSITIVE Sensitive     NITROFURANTOIN <=16 SENSITIVE Sensitive     TRIMETH/SULFA <=20 SENSITIVE Sensitive     AMPICILLIN/SULBACTAM >=32 RESISTANT Resistant     PIP/TAZO 8 SENSITIVE Sensitive     Extended ESBL POSITIVE Resistant     * >=100,000 COLONIES/mL ESCHERICHIA COLI   Pseudomonas aeruginosa - MIC*    CEFTAZIDIME 4 SENSITIVE Sensitive     CIPROFLOXACIN 2 INTERMEDIATE Intermediate     GENTAMICIN <=1 SENSITIVE Sensitive     IMIPENEM >=16 RESISTANT Resistant     PIP/TAZO 32 SENSITIVE Sensitive     CEFEPIME 4 SENSITIVE Sensitive     * 50,000 COLONIES/mL PSEUDOMONAS AERUGINOSA  Culture, blood (routine x 2)     Status: None (Preliminary result)   Collection Time: 08/14/16  8:02 PM  Result Value Ref Range Status   Specimen Description BLOOD RIGHT HAND  Final   Special Requests IN PEDIATRIC BOTTLE 3CC  Final   Culture NO GROWTH < 24 HOURS  Final   Report Status PENDING  Incomplete  Culture, blood (routine x 2)     Status: None (Preliminary result)   Collection Time: 08/14/16  8:02 PM  Result Value Ref Range Status   Specimen Description BLOOD RIGHT HAND  Final   Special Requests IN PEDIATRIC BOTTLE 3CC  Final   Culture NO GROWTH < 24 HOURS  Final   Report Status PENDING  Incomplete     Radiology Studies: Ct Abdomen Pelvis W Contrast  Result Date: 08/15/2016 CLINICAL DATA:  Acute onset of fever of unknown origin. Initial encounter. EXAM: CT ABDOMEN AND PELVIS WITH CONTRAST TECHNIQUE: Multidetector CT imaging of the abdomen and pelvis was performed using the standard protocol following bolus administration of intravenous contrast. CONTRAST:  100 mL ISOVUE-300 IOPAMIDOL (ISOVUE-300) INJECTION 61% COMPARISON:  CT of the  abdomen and pelvis performed 03/10/2016  FINDINGS: Lower chest: Trace bilateral pleural effusions are noted. There is partial consolidation of the left lower lobe. The heart is enlarged. Scattered coronary artery calcification is noted. Hepatobiliary: A 6 mm hypodensity is noted within the left hepatic lobe. A calcified granuloma is noted within the liver. Small stones are seen dependently within the gallbladder. There is distention of the common bile duct to 8-9 mm in diameter. Pancreas: The pancreatic duct is dilated to 5 mm in diameter, raising concern for distal obstruction. Spleen: The spleen is unremarkable in appearance. Adrenals/Urinary Tract: The adrenal glands are unremarkable in appearance. Small bilateral renal cysts are noted. Nonspecific left-sided perinephric stranding is seen. There is no evidence of hydronephrosis. No renal or ureteral stones are identified, though evaluation for renal stones is limited given contrast in the renal calyces. Stomach/Bowel: The stomach is decompressed. A G-tube is noted ending at the body of the stomach. The small bowel is decompressed and grossly unremarkable. The appendix is normal in caliber, without evidence of appendicitis. Scattered diverticulosis is noted along the cecum and ascending colon, with a right upper quadrant colostomy noted. The more distal colon is decompressed, with scattered diverticulosis along the transverse, descending and proximal sigmoid colon. There is no evidence of diverticulitis. Vascular/Lymphatic: Scattered calcification is seen along the abdominal aorta and its branches, including at the proximal renal arteries bilaterally. The abdominal aorta is otherwise grossly unremarkable. The inferior vena cava is grossly unremarkable. No retroperitoneal lymphadenopathy is seen. No pelvic sidewall lymphadenopathy is identified. Reproductive: The bladder is decompressed, with a Foley catheter in place, and a small amount of contrast in the bladder.  The uterus is grossly unremarkable. The ovaries are relatively symmetric. No suspicious adnexal masses are seen. Other: Diffuse soft-tissue phlegmon is noted at the right hip, with remodeling of the anterior right femur and acetabulum, and of the posterior sacrum and coccyx. Findings are compatible with chronic osteomyelitis and large sacral decubitus ulcerations. Mild phlegmon is noted at the left hip. Vague fluid is seen tracking along the musculature of the proximal right thigh. Presacral stranding is noted. Musculoskeletal: No acute osseous abnormalities are identified. The patient is status post thoracic spinal fusion. The visualized musculature is unremarkable in appearance. IMPRESSION: 1. Diffuse soft tissue phlegmon at the right hip. Remodeling of the anterior right femur and acetabulum, and at the posterior sacrum and coccyx, compatible with chronic osteomyelitis, due to underlying large sacral decubitus ulcerations. Mild phlegmon at the left hip. Vague fluid tracks along the musculature of the proximal right thigh, without focal abscess. Presacral stranding noted. 2. Trace bilateral pleural effusions. Partial consolidation of the left lower lobe, which could reflect pneumonia. 3. Dilatation of the common bile duct to 8-9 mm in diameter, and distention of the pancreatic duct to 5 mm, raising concern for distal obstruction. MRCP or ERCP could be considered for further evaluation, as deemed clinically appropriate. 4. Cholelithiasis. 5. Cardiomegaly.  Scattered coronary artery calcifications noted. 6. 6 mm nonspecific hypodensity within the left hepatic lobe. 7. Scattered diverticulosis along the cecum, and ascending, transverse, descending and proximal sigmoid colon. Right upper quadrant colostomy is unremarkable in appearance. No evidence of diverticulitis. 8. Scattered aortic atherosclerosis. Calcification at the proximal renal arteries bilaterally. Electronically Signed   By: Roanna Raider M.D.   On:  08/15/2016 02:12    Scheduled Meds: . aspirin  81 mg Oral Daily  . chlorhexidine  15 mL Mouth Rinse BID  . enoxaparin (LOVENOX) injection  40 mg Subcutaneous Q24H  . escitalopram  10  mg Per Tube Daily  . feeding supplement (PRO-STAT SUGAR FREE 64)  30 mL Per Tube TID WC  . folic acid-pyridoxine-cyancobalamin  1 tablet Per Tube Daily  . fosfomycin  3 g Oral Q72H  . free water  200 mL Per Tube Q8H  . mouth rinse  15 mL Mouth Rinse q12n4p  . meropenem (MERREM) IV  1 g Intravenous Q12H  . metoprolol tartrate  25 mg Per Tube BID  . multivitamin  1 tablet Oral Daily  . pantoprazole sodium  40 mg Per Tube Daily  . polyethylene glycol  17 g Per Tube Daily  . pravastatin  40 mg Per Tube Daily  . Valproate Sodium  200 mg Per Tube Q8H  . ascorbic acid  500 mg Oral Daily  . zinc sulfate  220 mg Oral Daily   Continuous Infusions: . feeding supplement (OSMOLITE 1.5 CAL) 1,000 mL (08/15/16 1405)     LOS: 7 days   Time spent: 25 minutes.  Hazeline Junkeryan [place] Monds, MD [facility] Pager [phone]  If 7PM-7AM, please contact night-coverage [email or web address] Password [email or web address] 08/15/2016, 5:05 PM

## 2016-08-15 NOTE — Progress Notes (Addendum)
[facility]    Date of Admission:  08/08/2016   Total days of antibiotics 9        Day 4 meropenem           ID: Katherine Palmer is a 55 y.o. female with  Fevers thought to be sepsis due to urinary source with ecoli/PsA uti Principal Problem:   Acute encephalopathy Active Problems:   Hemiparesis affecting left side as late effect of cerebrovascular accident (CVA) ([place])   Essential hypertension, benign   Dysphagia due to old cerebrovascular accident   S/P AKA (above knee amputation) ([place])   Stage IV decubitus ulcer ([facility])   Urinary tract infection associated with indwelling urethral catheter ([name])   Altered mental status    Subjective: Afebrile today though tmax yesterday of 103.8. She doesn't subscribe to significant back/hip pain. She recalls previously being treated for osteo many years ago  24hr events: underwent CT which showed evidence of left kidney perinephric stranding, soft tissue phlegmon at right hip, signs of chronic osteo  Medications:  . aspirin  81 mg Oral Daily  . chlorhexidine  15 mL Mouth Rinse BID  . enoxaparin (LOVENOX) injection  40 mg Subcutaneous Q24H  . escitalopram  10 mg Per Tube Daily  . feeding supplement (PRO-STAT SUGAR FREE 64)  30 mL Per Tube TID WC  . folic acid-pyridoxine-cyancobalamin  1 tablet Per Tube Daily  . fosfomycin  3 g Oral Q72H  . free water  200 mL Per Tube Q8H  . mouth rinse  15 mL Mouth Rinse q12n4p  . meropenem (MERREM) IV  1 g Intravenous Q12H  . metoprolol tartrate  25 mg Per Tube BID  . multivitamin  1 tablet Oral Daily  . pantoprazole sodium  40 mg Per Tube Daily  . polyethylene glycol  17 g Per Tube Daily  . pravastatin  40 mg Per Tube Daily  . Valproate Sodium  200 mg Per Tube Q8H  . ascorbic acid  500 mg Oral Daily  . zinc sulfate  220 mg Oral Daily    Objective: Vital signs in last 24 hours: Temp:  [98.2 F (36.8 C)-101.3 F (38.5 C)] 98.5 F (36.9 C) (11/30 1141) Pulse Rate:   [92-105] 92 (11/30 1710) Resp:  [18-20] 18 (11/30 1710) BP: (132-140)/(72-90) 132/72 (11/30 1710) SpO2:  [98 %-100 %] 100 % (11/30 7414) Physical Exam  Constitutional:  oriented to person, place, and time. appears chronically ill in no distress HENT: [place]/AT, PERRLA, no scleral icterus Mouth/Throat: Oropharynx is clear and moist. No oropharyngeal exudate.  Cardiovascular: Normal rate, regular rhythm and normal heart sounds. Exam reveals no gallop and no friction rub.  No murmur heard.  Pulmonary/Chest: Effort normal and breath sounds normal. No respiratory distress.  has no wheezes.  Neck = supple, no nuchal rigidity Abdominal: Soft. Bowel sounds are normal.  exhibits no distension. There is no tenderness.  Lymphadenopathy: no cervical adenopathy. No axillary adenopathy Neurological: alert and oriented to person, only. Right lower extremity contracture. Decrease strength to left arm/hand Ext: left aka Skin: dressing to her large sacral wound   Lab Results  Recent Labs  08/14/16 0605 08/15/16 0524  WBC 9.9 10.0  HGB 9.2* 9.8*  HCT 31.9* 34.1*  NA 144 140  K 4.1 4.1  CL 112* 112*  CO2 27 22  BUN 19 21*  CREATININE 0.36* <0.30*   Liver Panel  Recent Labs  08/13/16 0533 08/14/16 0605  PROT 5.9* 6.3*  ALBUMIN  1.3* 1.4*  AST 31 29  ALT 19 22  ALKPHOS 65 63  BILITOT 0.2* 0.2*   Sedimentation Rate  Recent Labs  08/15/16 1435  ESRSEDRATE 94*   C-Reactive Protein  Recent Labs  08/15/16 1602  CRP 1.3*    Microbiology: 11/23 urine cx esbl ecoli plus drug resistant PsA 11/23 blood cx ngtd  Studies/Results: Ct Abdomen Pelvis W Contrast  Result Date: 08/15/2016 CLINICAL DATA:  Acute onset of fever of unknown origin. Initial encounter. EXAM: CT ABDOMEN AND PELVIS WITH CONTRAST TECHNIQUE: Multidetector CT imaging of the abdomen and pelvis was performed using the standard protocol following bolus administration of intravenous contrast. CONTRAST:  100 mL ISOVUE-300  IOPAMIDOL (ISOVUE-300) INJECTION 61% COMPARISON:  CT of the abdomen and pelvis performed 03/10/2016 FINDINGS: Lower chest: Trace bilateral pleural effusions are noted. There is partial consolidation of the left lower lobe. The heart is enlarged. Scattered coronary artery calcification is noted. Hepatobiliary: A 6 mm hypodensity is noted within the left hepatic lobe. A calcified granuloma is noted within the liver. Small stones are seen dependently within the gallbladder. There is distention of the common bile duct to 8-9 mm in diameter. Pancreas: The pancreatic duct is dilated to 5 mm in diameter, raising concern for distal obstruction. Spleen: The spleen is unremarkable in appearance. Adrenals/Urinary Tract: The adrenal glands are unremarkable in appearance. Small bilateral renal cysts are noted. Nonspecific left-sided perinephric stranding is seen. There is no evidence of hydronephrosis. No renal or ureteral stones are identified, though evaluation for renal stones is limited given contrast in the renal calyces. Stomach/Bowel: The stomach is decompressed. A G-tube is noted ending at the body of the stomach. The small bowel is decompressed and grossly unremarkable. The appendix is normal in caliber, without evidence of appendicitis. Scattered diverticulosis is noted along the cecum and ascending colon, with a right upper quadrant colostomy noted. The more distal colon is decompressed, with scattered diverticulosis along the transverse, descending and proximal sigmoid colon. There is no evidence of diverticulitis. Vascular/Lymphatic: Scattered calcification is seen along the abdominal aorta and its branches, including at the proximal renal arteries bilaterally. The abdominal aorta is otherwise grossly unremarkable. The inferior vena cava is grossly unremarkable. No retroperitoneal lymphadenopathy is seen. No pelvic sidewall lymphadenopathy is identified. Reproductive: The bladder is decompressed, with a Foley  catheter in place, and a small amount of contrast in the bladder. The uterus is grossly unremarkable. The ovaries are relatively symmetric. No suspicious adnexal masses are seen. Other: Diffuse soft-tissue phlegmon is noted at the right hip, with remodeling of the anterior right femur and acetabulum, and of the posterior sacrum and coccyx. Findings are compatible with chronic osteomyelitis and large sacral decubitus ulcerations. Mild phlegmon is noted at the left hip. Vague fluid is seen tracking along the musculature of the proximal right thigh. Presacral stranding is noted. Musculoskeletal: No acute osseous abnormalities are identified. The patient is status post thoracic spinal fusion. The visualized musculature is unremarkable in appearance. IMPRESSION: 1. Diffuse soft tissue phlegmon at the right hip. Remodeling of the anterior right femur and acetabulum, and at the posterior sacrum and coccyx, compatible with chronic osteomyelitis, due to underlying large sacral decubitus ulcerations. Mild phlegmon at the left hip. Vague fluid tracks along the musculature of the proximal right thigh, without focal abscess. Presacral stranding noted. 2. Trace bilateral pleural effusions. Partial consolidation of the left lower lobe, which could reflect pneumonia. 3. Dilatation of the common bile duct to 8-9 mm in diameter, and distention of  the pancreatic duct to 5 mm, raising concern for distal obstruction. MRCP or ERCP could be considered for further evaluation, as deemed clinically appropriate. 4. Cholelithiasis. 5. Cardiomegaly.  Scattered coronary artery calcifications noted. 6. 6 mm nonspecific hypodensity within the left hepatic lobe. 7. Scattered diverticulosis along the cecum, and ascending, transverse, descending and proximal sigmoid colon. Right upper quadrant colostomy is unremarkable in appearance. No evidence of diverticulitis. 8. Scattered aortic atherosclerosis. Calcification at the proximal renal arteries  bilaterally. Electronically Signed   By: Garald Balding M.D.   On: 08/15/2016 02:12     Assessment/Plan: Fevers = possibly her fever curve is improving since being switched on meropenem. Will continue to monitor  Pyelonephritis = continue with meropenem which would treat the esbl ecoli. Though it would not address the 50K PsA for which she was previously treated since that isolate is R. Would suspect it was the ESBL ecoli that is contributing her clinical picture at this admission  Chronic osteo = she has large sacral wound stage 4 measuring 10 x 33 x 0.2cm which as exposed bone and drainage. It does not appear that she had recent treatment for chronic osteo or wound.   Recommend to treat for 6 wk with meropenem plus vancomycin. Please have orthopedics evaluate if she would be a candidate for surgical debridement based upon CT findings, though I suspect her present health issues may preclude her from surgery.  Will arrange for follow up appt in clinic and arrange for labs to be reviewed through clinic pharmacist  Diagnosis: Sacral osteo  Culture Result: esbl ecoli, mrsa colonized  No Known Allergies  Discharge antibiotics: Per pharmacy protocol vancomycin and meropenem Aim for Vancomycin trough 15-20 (unless otherwise indicated) Duration: 6 wk End Date: Jan 5th  [place] Per Protocol:  Labs weekly while on IV antibiotics: _x_ CBC with differential _x_ BMP twice per week __ CMP _x_ CRP _x_ ESR _x_ Vancomycin trough twice per week  _x_ Please pull PIC at completion of IV antibiotics   Fax weekly labs to [phone]  in 4-6 wk  @    [facility], [facility] Cell: [phone] Pager: [phone]  08/15/2016, 7:50 PM

## 2016-08-15 NOTE — Consult Note (Signed)
WOC re-consult: Reason for Consult:  Re-consult requested to explore possible source of sepsis. Refer to original consult notes on 11/24. Wound type: Appearance is unchanged since previous consult. 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  Drainage (amount, consistency, odor) Mod amt slightly green drainage with slight odor; this was not present during previous consult. Periwound:intact Dressing procedure/placement/frequency: Continue present plan of care with moist gauze dressing wound with ABD pads and tape.  Air mattress to decrease pressure.  If source of sepsis is unknown, consider MRI to r/o osteomyelitis since wound has exposed bone.   Middle back with full thickness wound; .8X.8X.2cm, red and moist, small amt yellow drainage, no odor. Foam dressing to absorb drainage and promote healing.   Right hip with patchy areas of partial thickness skin loss, red and macerated with moist red wound beds and loose peeling skin around wounds.  Foam dressing to protect from further injury.  No family members present to discuss plan of care. Please re-consult if further assistance is needed.  Thank-you,  Cammie Mcgeeawn Nyeem Stoke MSN, RN, CWOCN, [place], CNS [phone]

## 2016-08-16 LAB — CBC
HCT: 30.5 % — ABNORMAL LOW (ref 36.0–46.0)
Hemoglobin: 8.8 g/dL — ABNORMAL LOW (ref 12.0–15.0)
MCH: 28.9 pg (ref 26.0–34.0)
MCHC: 28.9 g/dL — AB (ref 30.0–36.0)
MCV: 100.3 fL — AB (ref 78.0–100.0)
PLATELETS: 316 10*3/uL (ref 150–400)
RBC: 3.04 MIL/uL — AB (ref 3.87–5.11)
RDW: 20.5 % — ABNORMAL HIGH (ref 11.5–15.5)
WBC: 8.8 10*3/uL (ref 4.0–10.5)

## 2016-08-16 LAB — GLUCOSE, CAPILLARY
Glucose-Capillary: 103 mg/dL — ABNORMAL HIGH (ref 65–99)
Glucose-Capillary: 118 mg/dL — ABNORMAL HIGH (ref 65–99)
Glucose-Capillary: 129 mg/dL — ABNORMAL HIGH (ref 65–99)

## 2016-08-16 MED ORDER — ACETAMINOPHEN 160 MG/5ML PO SOLN
650.0000 mg | Freq: Four times a day (QID) | ORAL | Status: DC | PRN
  Administered 2016-08-16 – 2016-08-20 (×2): 650 mg
  Filled 2016-08-16 (×2): qty 20.3

## 2016-08-16 MED ORDER — GADOBENATE DIMEGLUMINE 529 MG/ML IV SOLN
10.0000 mL | Freq: Once | INTRAVENOUS | Status: AC
  Administered 2016-08-16: 10 mL via INTRAVENOUS

## 2016-08-16 NOTE — Consult Note (Signed)
[facility] Surgery Consult Note  Katherine Palmer 10-Jun-1961  [id number].    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 She was thought to be encephalopathic from a UTI and was admitted for further evaluation and treatment - found to have sepsis secondary to ESBL E coli and Pseudomonas pyelonephritis. She has a colostomy pouch and receives nutrition though a PEG tube due to dysphagia. Patient with persistent fevers. Patient has chronic osteomyelitis secondary to her stage IV ulcers and MRI this admission shows a 3.9 x 12.7 x 3.2 cm rim-enhancing fluid collection in patients right hip adductor musculature, possibly a deep tissue abscess. General surgery has been asked to consult.  ROS: Review of Systems  Constitutional: Positive for fever. Negative for chills and diaphoresis.  Respiratory: Negative for shortness of breath.   Cardiovascular: Negative for chest pain.  Genitourinary:       Pain  Musculoskeletal: Positive for myalgias.  Neurological: Negative for dizziness.    Family History  Problem Relation Age of Onset  . Hypertension Other     Past Medical History:  Diagnosis Date  . Decubitus ulcer of sacral region, stage 4 ([place])   . Depression   . Dysphagia   . Dysphagia as late effect of cerebrovascular disease   . Hyperlipidemia   . Hypertension   . Seizures ([place])   . Stroke [facility])     Past Surgical History:  Procedure Laterality Date  . 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 COLOSTOMY    . PEG TUBE PLACEMENT      Social History:  reports that she has quit smoking. She has never used smokeless tobacco. She reports that she does not drink alcohol or use drugs.  Allergies: No Known  Allergies  Medications Prior to Admission  Medication Sig Dispense Refill  . acetaminophen (TYLENOL) 325 MG tablet Place 650 mg into feeding tube every 4 (four) hours as needed for mild pain.     Marland Kitchen albuterol (PROVENTIL) (2.5 MG/3ML) 0.083% nebulizer solution Take 2.5 mg by nebulization every 4 (four) hours as needed for wheezing or shortness of breath.    . Amino Acids-Protein Hydrolys (FEEDING SUPPLEMENT, PRO-STAT SUGAR FREE 64,) LIQD Place 30 mLs into feeding tube 3 (three) times daily with meals.    Marland Kitchen ascorbic acid (VITAMIN C) 500 MG tablet Take 500 mg by mouth daily.    Marland Kitchen aspirin EC 81 MG tablet Take 81 mg by mouth daily. Via G-Tube     . bisacodyl (DULCOLAX) 10 MG suppository Place 10 mg rectally daily as needed for moderate constipation.    . cloNIDine (CATAPRES) 0.1 MG tablet Place 0.1 mg into feeding tube every 6 (six) hours as needed (blood pressure).     . collagenase (SANTYL) ointment Apply 1 application topically daily.    Marland Kitchen escitalopram (LEXAPRO) 5 MG tablet Place 10 mg into feeding tube daily.    Marland Kitchen ipratropium-albuterol (DUONEB) 0.5-2.5 (3) MG/3ML SOLN Take 3 mLs by nebulization every 6 (six) hours as needed (sob).     . LORazepam (ATIVAN) 0.5 MG tablet Place 1 tablet (0.5 mg total) into feeding tube every 8 (eight) hours as needed for anxiety. 10 tablet 1  . magnesium hydroxide (MILK OF MAGNESIA) 400 MG/5ML suspension Place 30 mLs into feeding  tube daily as needed for mild constipation.     . metoprolol tartrate (LOPRESSOR) 25 MG tablet Take 1 tablet (25 mg total) by mouth 2 (two) times daily. (Patient taking differently: Place 25 mg into feeding tube 2 (two) times daily. )    . Multiple Vitamins-Minerals (DECUBI-VITE) CAPS Take 1 capsule by mouth daily.    . Nutritional Supplements (FEEDING SUPPLEMENT, OSMOLITE 1.5 CAL,) LIQD Place 1,000 mLs into feeding tube continuous. 1 Bottle 0  . OxyCODONE HCl, Abuse Deter, (OXAYDO) 5 MG TABA Take 5 mg by mouth every 6 (six) hours as needed  (pain). 10 tablet 0  . pantoprazole sodium (PROTONIX) 40 mg/20 mL PACK Place 40 mg into feeding tube daily.    . polyethylene glycol (MIRALAX / GLYCOLAX) packet Place 17 g into feeding tube daily.    . pravastatin (PRAVACHOL) 40 MG tablet Place 40 mg into feeding tube daily.    . Prenatal Ca Carb-B6-B12-FA (FOLBECAL) 1 MG TABS Give 1 tablet by tube daily.    Marland Kitchen UNABLE TO FIND Med Name:House Supplement with meals for caloric support and weight loss nutritional treat 4 oz three times daily at meals    . Valproate Sodium (DEPAKENE PO) Give 200 mg by tube every 8 (eight) hours.    . Zinc 100 MG TABS Take 200 mg by mouth daily.      Blood pressure 121/71, pulse 94, temperature (!) 101.1 F (38.4 C), temperature source Oral, resp. rate 20, weight 104 lb 15 oz (47.6 kg), SpO2 96 %. Physical Exam: General: cooperative, ill-appearing AA female laying in bed in NAD HEENT: head is normocephalic, atraumatic.   Heart: regular, rate, and rhythm.  No obvious murmurs, gallops, or rubs noted. Abd: soft, NT, colostomy pouch with good seal MS: left AKA, right thigh without obvious erythema or induration Sacral wound: clean, granulating nicely, no purulent discharge or necrotic tissue visible      Psych: appropriate affect. Neuro: awake, no focal neurological deficits  Results for orders placed or performed during the hospital encounter of 08/08/16 (from the past 48 hour(s))  Glucose, capillary     Status: Abnormal   Collection Time: 08/14/16  4:39 PM  Result Value Ref Range   Glucose-Capillary 123 (H) 65 - 99 mg/dL  Culture, blood (routine x 2)     Status: None (Preliminary result)   Collection Time: 08/14/16  8:02 PM  Result Value Ref Range   Specimen Description BLOOD RIGHT HAND    Special Requests IN PEDIATRIC BOTTLE 3CC    Culture NO GROWTH 2 DAYS    Report Status PENDING   Culture, blood (routine x 2)     Status: None (Preliminary result)   Collection Time: 08/14/16  8:02 PM  Result Value  Ref Range   Specimen Description BLOOD RIGHT HAND    Special Requests IN PEDIATRIC BOTTLE 3CC    Culture NO GROWTH 2 DAYS    Report Status PENDING   Glucose, capillary     Status: None   Collection Time: 08/15/16  2:20 AM  Result Value Ref Range   Glucose-Capillary 95 65 - 99 mg/dL  CBC     Status: Abnormal   Collection Time: 08/15/16  5:24 AM  Result Value Ref Range   WBC 10.0 4.0 - 10.5 K/uL   RBC 3.38 (L) 3.87 - 5.11 MIL/uL   Hemoglobin 9.8 (L) 12.0 - 15.0 g/dL   HCT 34.1 (L) 36.0 - 46.0 %   MCV 100.9 (H) 78.0 - 100.0 fL  MCH 29.0 26.0 - 34.0 pg   MCHC 28.7 (L) 30.0 - 36.0 g/dL   RDW 20.5 (H) 11.5 - 15.5 %   Platelets 331 150 - 400 K/uL  Basic metabolic panel     Status: Abnormal   Collection Time: 08/15/16  5:24 AM  Result Value Ref Range   Sodium 140 135 - 145 mmol/L   Potassium 4.1 3.5 - 5.1 mmol/L   Chloride 112 (H) 101 - 111 mmol/L   CO2 22 22 - 32 mmol/L   Glucose, Bld 135 (H) 65 - 99 mg/dL   BUN 21 (H) 6 - 20 mg/dL   Creatinine, Ser <0.30 (L) 0.44 - 1.00 mg/dL   Calcium 8.3 (L) 8.9 - 10.3 mg/dL   GFR calc non Af Amer NOT CALCULATED >60 mL/min   GFR calc Af Amer NOT CALCULATED >60 mL/min    Comment: (NOTE) The eGFR has been calculated using the CKD EPI equation. This calculation has not been validated in all clinical situations. eGFR's persistently <60 mL/min signify possible Chronic Kidney Disease.    Anion gap 6 5 - 15  Lipase, blood     Status: None   Collection Time: 08/15/16  8:36 AM  Result Value Ref Range   Lipase 23 11 - 51 U/L  Sedimentation rate     Status: Abnormal   Collection Time: 08/15/16  2:35 PM  Result Value Ref Range   Sed Rate 94 (H) 0 - 22 mm/hr  C-reactive protein     Status: Abnormal   Collection Time: 08/15/16  4:02 PM  Result Value Ref Range   CRP 1.3 (H) <1.0 mg/dL  Glucose, capillary     Status: Abnormal   Collection Time: 08/16/16  2:42 AM  Result Value Ref Range   Glucose-Capillary 103 (H) 65 - 99 mg/dL  CBC     Status:  Abnormal   Collection Time: 08/16/16  4:15 AM  Result Value Ref Range   WBC 8.8 4.0 - 10.5 K/uL   RBC 3.04 (L) 3.87 - 5.11 MIL/uL   Hemoglobin 8.8 (L) 12.0 - 15.0 g/dL   HCT 30.5 (L) 36.0 - 46.0 %   MCV 100.3 (H) 78.0 - 100.0 fL   MCH 28.9 26.0 - 34.0 pg   MCHC 28.9 (L) 30.0 - 36.0 g/dL   RDW 20.5 (H) 11.5 - 15.5 %   Platelets 316 150 - 400 K/uL  Glucose, capillary     Status: Abnormal   Collection Time: 08/16/16  8:07 AM  Result Value Ref Range   Glucose-Capillary 118 (H) 65 - 99 mg/dL   Mr Hip Right W Wo Contrast  Result Date: 08/16/2016 CLINICAL DATA:  55 year old with fever, right sacral decubitus ulcer and extensive chronic inflammatory changes around the right hip on CT. Previous left above the knee amputation. EXAM: MRI OF THE RIGHT HIP WITHOUT AND WITH CONTRAST MR SACRUM AND SACROILIAC JOINTS WITHOUT AND WITH CONTRAST TECHNIQUE: Multiplanar, multisequence MR imaging was performed both before and after administration of intravenous contrast. CONTRAST:  10 ml MultiHance. COMPARISON:  CT 08/15/2016. FINDINGS: Bones/Joint/Cartilage : As correlated with CT, there is diffuse destruction, remodeling and sclerosis of the lower sacrum and coccyx, most consistent with chronic osteomyelitis. The sacroiliac joints and symphysis pubis are intact. Both hips are located. There is extensive heterotopic ossification surrounding the right hip joint. There is an associated complex right hip joint effusion with a large peripherally enhancing fluid collection within the right adductor musculature. This fluid collection is best seen on  the postcontrast images (series 37 through 42). It measures up to 3.9 x 12.7 x 3.2 cm. No other focal fluid collections are seen. There is some marrow edema and enhancement posteriorly in both iliac bones. No evidence of proximal femoral osteomyelitis. Ligaments Not relevant for exam/indication. Muscles and Tendons There is asymmetric T2 hyperintensity and enhancement throughout  the right pelvic and proximal thigh musculature. As above, there is a large fluid collection within the right hip adductor musculature. The iliopsoas and hamstring tendons appear grossly intact. Soft tissues There is decubitus soft tissue ulceration posteriorly over the sacrum and right hip joint. There is diffuse edema and enhancement throughout the adjacent soft tissues, but no other focal fluid collections. Foley catheter and a small amount of free pelvic fluid noted. IMPRESSION: 1. Chronic destruction and remodeling of the lower sacrum and coccyx consistent with chronic osteomyelitis. There is marrow edema and enhancement posteriorly in both iliac bones, suspicious for osteomyelitis. 2. Extensive heterotopic ossification and synovitis surrounding the right hip with large peripherally enhancing fluid collection in the right hip adductor musculature, worrisome for soft tissue abscess. No specific evidence of active osteomyelitis at the right hip. 3. Multifocal decubitus ulceration in the pelvis with associated generalized soft tissue inflammatory changes. Electronically Signed   By: Richardean Sale M.D.   On: 08/16/2016 11:31   Ct Abdomen Pelvis W Contrast  Result Date: 08/15/2016 CLINICAL DATA:  Acute onset of fever of unknown origin. Initial encounter. EXAM: CT ABDOMEN AND PELVIS WITH CONTRAST TECHNIQUE: Multidetector CT imaging of the abdomen and pelvis was performed using the standard protocol following bolus administration of intravenous contrast. CONTRAST:  100 mL ISOVUE-300 IOPAMIDOL (ISOVUE-300) INJECTION 61% COMPARISON:  CT of the abdomen and pelvis performed 03/10/2016 FINDINGS: Lower chest: Trace bilateral pleural effusions are noted. There is partial consolidation of the left lower lobe. The heart is enlarged. Scattered coronary artery calcification is noted. Hepatobiliary: A 6 mm hypodensity is noted within the left hepatic lobe. A calcified granuloma is noted within the liver. Small stones are  seen dependently within the gallbladder. There is distention of the common bile duct to 8-9 mm in diameter. Pancreas: The pancreatic duct is dilated to 5 mm in diameter, raising concern for distal obstruction. Spleen: The spleen is unremarkable in appearance. Adrenals/Urinary Tract: The adrenal glands are unremarkable in appearance. Small bilateral renal cysts are noted. Nonspecific left-sided perinephric stranding is seen. There is no evidence of hydronephrosis. No renal or ureteral stones are identified, though evaluation for renal stones is limited given contrast in the renal calyces. Stomach/Bowel: The stomach is decompressed. A G-tube is noted ending at the body of the stomach. The small bowel is decompressed and grossly unremarkable. The appendix is normal in caliber, without evidence of appendicitis. Scattered diverticulosis is noted along the cecum and ascending colon, with a right upper quadrant colostomy noted. The more distal colon is decompressed, with scattered diverticulosis along the transverse, descending and proximal sigmoid colon. There is no evidence of diverticulitis. Vascular/Lymphatic: Scattered calcification is seen along the abdominal aorta and its branches, including at the proximal renal arteries bilaterally. The abdominal aorta is otherwise grossly unremarkable. The inferior vena cava is grossly unremarkable. No retroperitoneal lymphadenopathy is seen. No pelvic sidewall lymphadenopathy is identified. Reproductive: The bladder is decompressed, with a Foley catheter in place, and a small amount of contrast in the bladder. The uterus is grossly unremarkable. The ovaries are relatively symmetric. No suspicious adnexal masses are seen. Other: Diffuse soft-tissue phlegmon is noted at the right  hip, with remodeling of the anterior right femur and acetabulum, and of the posterior sacrum and coccyx. Findings are compatible with chronic osteomyelitis and large sacral decubitus ulcerations. Mild  phlegmon is noted at the left hip. Vague fluid is seen tracking along the musculature of the proximal right thigh. Presacral stranding is noted. Musculoskeletal: No acute osseous abnormalities are identified. The patient is status post thoracic spinal fusion. The visualized musculature is unremarkable in appearance. IMPRESSION: 1. Diffuse soft tissue phlegmon at the right hip. Remodeling of the anterior right femur and acetabulum, and at the posterior sacrum and coccyx, compatible with chronic osteomyelitis, due to underlying large sacral decubitus ulcerations. Mild phlegmon at the left hip. Vague fluid tracks along the musculature of the proximal right thigh, without focal abscess. Presacral stranding noted. 2. Trace bilateral pleural effusions. Partial consolidation of the left lower lobe, which could reflect pneumonia. 3. Dilatation of the common bile duct to 8-9 mm in diameter, and distention of the pancreatic duct to 5 mm, raising concern for distal obstruction. MRCP or ERCP could be considered for further evaluation, as deemed clinically appropriate. 4. Cholelithiasis. 5. Cardiomegaly.  Scattered coronary artery calcifications noted. 6. 6 mm nonspecific hypodensity within the left hepatic lobe. 7. Scattered diverticulosis along the cecum, and ascending, transverse, descending and proximal sigmoid colon. Right upper quadrant colostomy is unremarkable in appearance. No evidence of diverticulitis. 8. Scattered aortic atherosclerosis. Calcification at the proximal renal arteries bilaterally. Electronically Signed   By: Garald Balding M.D.   On: 08/15/2016 02:12   Mr Sacrum Si Joints W Wo Contrast  Result Date: 08/16/2016 CLINICAL DATA:  55 year old with fever, right sacral decubitus ulcer and extensive chronic inflammatory changes around the right hip on CT. Previous left above the knee amputation. EXAM: MRI OF THE RIGHT HIP WITHOUT AND WITH CONTRAST MR SACRUM AND SACROILIAC JOINTS WITHOUT AND WITH CONTRAST  TECHNIQUE: Multiplanar, multisequence MR imaging was performed both before and after administration of intravenous contrast. CONTRAST:  10 ml MultiHance. COMPARISON:  CT 08/15/2016. FINDINGS: Bones/Joint/Cartilage : As correlated with CT, there is diffuse destruction, remodeling and sclerosis of the lower sacrum and coccyx, most consistent with chronic osteomyelitis. The sacroiliac joints and symphysis pubis are intact. Both hips are located. There is extensive heterotopic ossification surrounding the right hip joint. There is an associated complex right hip joint effusion with a large peripherally enhancing fluid collection within the right adductor musculature. This fluid collection is best seen on the postcontrast images (series 37 through 42). It measures up to 3.9 x 12.7 x 3.2 cm. No other focal fluid collections are seen. There is some marrow edema and enhancement posteriorly in both iliac bones. No evidence of proximal femoral osteomyelitis. Ligaments Not relevant for exam/indication. Muscles and Tendons There is asymmetric T2 hyperintensity and enhancement throughout the right pelvic and proximal thigh musculature. As above, there is a large fluid collection within the right hip adductor musculature. The iliopsoas and hamstring tendons appear grossly intact. Soft tissues There is decubitus soft tissue ulceration posteriorly over the sacrum and right hip joint. There is diffuse edema and enhancement throughout the adjacent soft tissues, but no other focal fluid collections. Foley catheter and a small amount of free pelvic fluid noted. IMPRESSION: 1. Chronic destruction and remodeling of the lower sacrum and coccyx consistent with chronic osteomyelitis. There is marrow edema and enhancement posteriorly in both iliac bones, suspicious for osteomyelitis. 2. Extensive heterotopic ossification and synovitis surrounding the right hip with large peripherally enhancing fluid collection in the right  hip adductor  musculature, worrisome for soft tissue abscess. No specific evidence of active osteomyelitis at the right hip. 3. Multifocal decubitus ulceration in the pelvis with associated generalized soft tissue inflammatory changes. Electronically Signed   By: Richardean Sale M.D.   On: 08/16/2016 11:31   Assessment/Plan Chronic osteomyelitis secondary to stage IV decubital ulcer  Wound is clean and granulating well, no signs concerning for infection. No acute need for surgical intervention. Recommend consulting orthopedic surgery regarding new deep fluid collection in right hip/adductor. Continue current wound care to sacral wound. General surgery will sign off but will be available as needed.   Jill Alexanders, [facility] Surgery 08/16/2016, 3:40 PM Pager: [phone] Consults: [phone] Mon-Fri 7:00 am-4:30 pm Sat-Sun 7:00 am-11:30 am

## 2016-08-16 NOTE — Progress Notes (Signed)
PROGRESS NOTE  Katherine Palmer  LZJ:[id number] DOB: [birth year] DOA: 08/08/2016 PCP: Gildardo Cranker, DO   Brief Narrative: Patient is a 55 y.o.female with history of CVA with left hemiparesis, with left AKA, chronic right lower extremity contracture, stage IV decub ulcer with colostomy and PEG tube for feeding due to dysphagia, seizure disorder and HTNwas brought to the ER from the skilled nursing facility after patient was found to have altered mental status. She was thought to be encephalopathic from a UTI. She was subsequently admitted for further evaluation and treatment.   Assessment & Plan: Principal Problem:   Acute encephalopathy Active Problems:   Hemiparesis affecting left side as late effect of cerebrovascular accident (CVA) ([place])   Essential hypertension, benign   Dysphagia due to old cerebrovascular accident   S/P AKA (above knee amputation) ([place])   Stage IV decubitus ulcer ([facility])   Urinary tract infection associated with indwelling urethral catheter ([facility])   Altered mental status  Sepsis from ESBL E Coli and Pseudomonas pyelonephritis: Sepsis improving but not resolving, still febrile but hemodynamically stable. CT showed left perinephric stranding as well as concern for LLL consolidation though suspicion for PNA remains low. UCx with >100,000 CFU ESBL Escherichia coli and >50,000 CFU of Pseudomonas Aeruginosa. Suspect zosyn (with MIC 32) was inadequately treating infections.  - ID consulted, restarted meropenem, fever curve improving. - Blood culltures 11/23 and 11/29   Chronic osteomyelitis due to stage IV decubitus ulcers: Present PTA. See pictures taken on 11/24 by Dr. Sloan Leiter. - Appreciate WOC assistance: wounds unchanged since admission with exposed bone - MRI shows chronic osteomyelitis of sacrum and coccyx with concern for acute osteomyelitis in iliac bones and enhancing fluid collection in right hipp adductor musculature, possibly deep tissue abscess.  - Consult  orthopedic surgery - ESR 94, CRP 1.3.   Biliary and pancreatic duct dilitation: With normal lipase and LFTs, doubt this is of clinical significance.   Acute metabolic encephalopathy: EEG not performed due to hair matting.  - Improving.Suspect likely due to UTI on abnormal baseline.  Hypertension:  -Continue with Metoprolol 25 mg po per PEG BID -C/w Hydralazine 10 mg IV q4hprn for SBP > 160   Hyperkalemia:Resolved - Repeat BMP in AM  History of CVA with residual left-sided Hemiplegia, Dysarthria and Dysphagia:  -PEG tube in place -Continue Aspirin 81 mg and Statin.   Seizure Disorder:  -Continue Valproate Sodium 200 mg Per PEG q8h: level was < 10 on admission (goal 50-100)  Depression -C/w Escitalopram 10 mg Per PEG Daily  S/P Left AKA (above knee amputation)  DVT prophylaxis: Lovenox Code Status: Full Family Communication: None at bedside this AM Disposition Plan: SNF once sepsis resolved, requiring IV antibiotics.   Consultants:   Infectious disease, Dr. Johnnye Sima by phone 11/27 and Dr. Baxter Flattery by phone 11/28.  ID, Dr. Baxter Flattery formally consulted 11/29.  Orthopedic surgery  Procedures:   None  Antimicrobials:  Ceftriaxone 11/23  Ceftazidime 11/23 > 11/26  Meropenem 11/26 > 11/28, restarted 11/29  Zosyn 11/27 > 11/28  Fosfomycin 11/28 >> 11/29  Subjective: Pt again reports "my butt hurts" and generally feeling unchanged. No feelings of fever/chills, chest pain, cough, wheezing, dyspnea.   Objective: Vitals:   08/15/16 2001 08/15/16 2207 08/16/16 0513 08/16/16 0958  BP:  126/81 123/79 135/79  Pulse:  (!) 105 96 (!) 102  Resp:  18 20   Temp: 97.4 F (36.3 C) 99.2 F (37.3 C) 98.4 F (36.9 C)   TempSrc: Oral  Oral   SpO2:  100% 99%   Weight:        Intake/Output Summary (Last 24 hours) at 08/16/16 1254 Last data filed at 08/16/16 0944  Gross per 24 hour  Intake              100 ml  Output             1140 ml  Net            -1040 ml    Filed Weights   08/13/16 1005  Weight: 47.6 kg (104 lb 15 oz)    Examination: General exam: 55 y.o. female in no distress appearing older than stated age. Respiratory system: Non-labored breathing room air. Diminished but clear to auscultation bilaterally.  Cardiovascular system: Tachycardic rate and normal rhythm. No murmur, rub, or gallop. No JVD, and no edema. Gastrointestinal system: Abdomen soft, non-tender, non-distended, with normoactive bowel sounds. No organomegaly or masses felt. Colostomy site on right abdomen nontender with clear brown contents. PEG in left abdomen nontender without discharge.  Central nervous system: Awake, not oriented, doesn't believe she's in the hospital. No focal neurological deficits. Extremities: Left high AKA, right leg contracted with tenderness to palpation along proximal musculature without focal abscess palpated. Skin: Decubitus ulcer with c/d/i dressing pad not removed.  Psychiatry: UTD  Data Reviewed: I have personally reviewed following labs and imaging studies  CBC:  Recent Labs Lab 08/11/16 0542 08/13/16 0533 08/14/16 0605 08/15/16 0524 08/16/16 0415  WBC 9.6 9.9 9.9 10.0 8.8  NEUTROABS  --  5.2  --   --   --   HGB 10.5* 9.3* 9.2* 9.8* 8.8*  HCT 35.7* 32.2* 31.9* 34.1* 30.5*  MCV 100.0 100.6* 100.9* 100.9* 100.3*  PLT 274 244 303 331 782   Basic Metabolic Panel:  Recent Labs Lab 08/10/16 0712 08/11/16 0542 08/12/16 0634 08/13/16 0533 08/14/16 0605 08/15/16 0524  NA  --  142 144 145 144 140  K  --  5.5* 4.7 4.2 4.1 4.1  CL  --  108 110 113* 112* 112*  CO2  --  _0 GLUCOSE  --  114* 127* 120* 131* 135*  BUN  --  19 20 22* 19 21*  CREATININE  --  0.34* 0.32* 0.34* 0.36* <0.30*  CALCIUM  --  8.0* 8.0* 8.1* 8.2* 8.3*  MG 2.2  --   --  1.8  --   --   PHOS  --   --   --  2.3*  --   --    GFR: CrCl cannot be calculated (This lab value cannot be used to calculate CrCl because it is not a number:  <0.30). Liver Function Tests:  Recent Labs Lab 08/13/16 0533 08/14/16 0605  AST 31 29  ALT 19 22  ALKPHOS 65 63  BILITOT 0.2* 0.2*  PROT 5.9* 6.3*  ALBUMIN 1.3* 1.4*    Recent Labs Lab 08/15/16 0836  LIPASE 23   No results for input(s): AMMONIA in the last 168 hours. CBG:  Recent Labs Lab 08/14/16 0747 08/14/16 1639 08/15/16 0220 08/16/16 0242 08/16/16 0807  GLUCAP 116* 123* 95 103* 118*   Lipid Profile: No results for input(s): CHOL, HDL, LDLCALC, TRIG, CHOLHDL, LDLDIRECT in the last 72 hours. Thyroid Function Tests: No results for input(s): TSH, T4TOTAL, FREET4, T3FREE, THYROIDAB in the last 72 hours. Anemia Panel: No results for input(s): VITAMINB12, FOLATE, FERRITIN, TIBC, IRON, RETICCTPCT in the last 72 hours. Urine analysis:    Component Value Date/Time  COLORURINE AMBER (A) 08/08/2016 0115   APPEARANCEUR CLOUDY (A) 08/08/2016 0115   LABSPEC 1.024 08/08/2016 0115   PHURINE 5.5 08/08/2016 0115   GLUCOSEU NEGATIVE 08/08/2016 0115   HGBUR MODERATE (A) 08/08/2016 0115   BILIRUBINUR NEGATIVE 08/08/2016 0115   KETONESUR NEGATIVE 08/08/2016 0115   PROTEINUR 30 (A) 08/08/2016 0115   NITRITE POSITIVE (A) 08/08/2016 0115   LEUKOCYTESUR MODERATE (A) 08/08/2016 0115   Sepsis Labs: _0 (procalcitonin:4,lacticidven:4)  ) Recent Results (from the past 240 hour(s))  Blood Culture (routine x 2)     Status: None   Collection Time: 08/08/16 12:35 AM  Result Value Ref Range Status   Specimen Description BLOOD RIGHT HAND  Final   Special Requests IN PEDIATRIC BOTTLE 3ML  Final   Culture NO GROWTH 5 DAYS  Final   Report Status 08/13/2016 FINAL  Final  Blood Culture (routine x 2)     Status: None   Collection Time: 08/08/16 12:50 AM  Result Value Ref Range Status   Specimen Description BLOOD RIGHT HAND  Final   Special Requests BOTTLES DRAWN AEROBIC AND ANAEROBIC 5ML  Final   Culture NO GROWTH 5 DAYS  Final   Report Status 08/13/2016 FINAL  Final  Urine  culture     Status: Abnormal   Collection Time: 08/08/16  1:15 AM  Result Value Ref Range Status   Specimen Description URINE, RANDOM  Final   Special Requests NONE  Final   Culture (A)  Final    >=100,000 COLONIES/mL ESCHERICHIA COLI Confirmed Extended Spectrum Beta-Lactamase Producer (ESBL) 50,000 COLONIES/mL PSEUDOMONAS AERUGINOSA    Report Status 08/12/2016 FINAL  Final   Organism ID, Bacteria ESCHERICHIA COLI (A)  Final   Organism ID, Bacteria PSEUDOMONAS AERUGINOSA (A)  Final      Susceptibility   Escherichia coli - MIC*    AMPICILLIN >=32 RESISTANT Resistant     CEFAZOLIN >=64 RESISTANT Resistant     CEFTRIAXONE >=64 RESISTANT Resistant     CIPROFLOXACIN >=4 RESISTANT Resistant     GENTAMICIN <=1 SENSITIVE Sensitive     IMIPENEM <=0.25 SENSITIVE Sensitive     NITROFURANTOIN <=16 SENSITIVE Sensitive     TRIMETH/SULFA <=20 SENSITIVE Sensitive     AMPICILLIN/SULBACTAM >=32 RESISTANT Resistant     PIP/TAZO 8 SENSITIVE Sensitive     Extended ESBL POSITIVE Resistant     * >=100,000 COLONIES/mL ESCHERICHIA COLI   Pseudomonas aeruginosa - MIC*    CEFTAZIDIME 4 SENSITIVE Sensitive     CIPROFLOXACIN 2 INTERMEDIATE Intermediate     GENTAMICIN <=1 SENSITIVE Sensitive     IMIPENEM >=16 RESISTANT Resistant     PIP/TAZO 32 SENSITIVE Sensitive     CEFEPIME 4 SENSITIVE Sensitive     * 50,000 COLONIES/mL PSEUDOMONAS AERUGINOSA  Culture, blood (routine x 2)     Status: None (Preliminary result)   Collection Time: 08/14/16  8:02 PM  Result Value Ref Range Status   Specimen Description BLOOD RIGHT HAND  Final   Special Requests IN PEDIATRIC BOTTLE 3CC  Final   Culture NO GROWTH 2 DAYS  Final   Report Status PENDING  Incomplete  Culture, blood (routine x 2)     Status: None (Preliminary result)   Collection Time: 08/14/16  8:02 PM  Result Value Ref Range Status   Specimen Description BLOOD RIGHT HAND  Final   Special Requests IN PEDIATRIC BOTTLE 3CC  Final   Culture NO GROWTH 2 DAYS   Final   Report Status PENDING  Incomplete  Radiology Studies: Mr Hip Right W Wo Contrast  Result Date: 08/16/2016 CLINICAL DATA:  55 year old with fever, right sacral decubitus ulcer and extensive chronic inflammatory changes around the right hip on CT. Previous left above the knee amputation. EXAM: MRI OF THE RIGHT HIP WITHOUT AND WITH CONTRAST MR SACRUM AND SACROILIAC JOINTS WITHOUT AND WITH CONTRAST TECHNIQUE: Multiplanar, multisequence MR imaging was performed both before and after administration of intravenous contrast. CONTRAST:  10 ml MultiHance. COMPARISON:  CT 08/15/2016. FINDINGS: Bones/Joint/Cartilage : As correlated with CT, there is diffuse destruction, remodeling and sclerosis of the lower sacrum and coccyx, most consistent with chronic osteomyelitis. The sacroiliac joints and symphysis pubis are intact. Both hips are located. There is extensive heterotopic ossification surrounding the right hip joint. There is an associated complex right hip joint effusion with a large peripherally enhancing fluid collection within the right adductor musculature. This fluid collection is best seen on the postcontrast images (series 37 through 42). It measures up to 3.9 x 12.7 x 3.2 cm. No other focal fluid collections are seen. There is some marrow edema and enhancement posteriorly in both iliac bones. No evidence of proximal femoral osteomyelitis. Ligaments Not relevant for exam/indication. Muscles and Tendons There is asymmetric T2 hyperintensity and enhancement throughout the right pelvic and proximal thigh musculature. As above, there is a large fluid collection within the right hip adductor musculature. The iliopsoas and hamstring tendons appear grossly intact. Soft tissues There is decubitus soft tissue ulceration posteriorly over the sacrum and right hip joint. There is diffuse edema and enhancement throughout the adjacent soft tissues, but no other focal fluid collections. Foley catheter and a small  amount of free pelvic fluid noted. IMPRESSION: 1. Chronic destruction and remodeling of the lower sacrum and coccyx consistent with chronic osteomyelitis. There is marrow edema and enhancement posteriorly in both iliac bones, suspicious for osteomyelitis. 2. Extensive heterotopic ossification and synovitis surrounding the right hip with large peripherally enhancing fluid collection in the right hip adductor musculature, worrisome for soft tissue abscess. No specific evidence of active osteomyelitis at the right hip. 3. Multifocal decubitus ulceration in the pelvis with associated generalized soft tissue inflammatory changes. Electronically Signed   By: Richardean Sale M.D.   On: 08/16/2016 11:31   Ct Abdomen Pelvis W Contrast  Result Date: 08/15/2016 CLINICAL DATA:  Acute onset of fever of unknown origin. Initial encounter. EXAM: CT ABDOMEN AND PELVIS WITH CONTRAST TECHNIQUE: Multidetector CT imaging of the abdomen and pelvis was performed using the standard protocol following bolus administration of intravenous contrast. CONTRAST:  100 mL ISOVUE-300 IOPAMIDOL (ISOVUE-300) INJECTION 61% COMPARISON:  CT of the abdomen and pelvis performed 03/10/2016 FINDINGS: Lower chest: Trace bilateral pleural effusions are noted. There is partial consolidation of the left lower lobe. The heart is enlarged. Scattered coronary artery calcification is noted. Hepatobiliary: A 6 mm hypodensity is noted within the left hepatic lobe. A calcified granuloma is noted within the liver. Small stones are seen dependently within the gallbladder. There is distention of the common bile duct to 8-9 mm in diameter. Pancreas: The pancreatic duct is dilated to 5 mm in diameter, raising concern for distal obstruction. Spleen: The spleen is unremarkable in appearance. Adrenals/Urinary Tract: The adrenal glands are unremarkable in appearance. Small bilateral renal cysts are noted. Nonspecific left-sided perinephric stranding is seen. There is no  evidence of hydronephrosis. No renal or ureteral stones are identified, though evaluation for renal stones is limited given contrast in the renal calyces. Stomach/Bowel: The stomach is decompressed. A  G-tube is noted ending at the body of the stomach. The small bowel is decompressed and grossly unremarkable. The appendix is normal in caliber, without evidence of appendicitis. Scattered diverticulosis is noted along the cecum and ascending colon, with a right upper quadrant colostomy noted. The more distal colon is decompressed, with scattered diverticulosis along the transverse, descending and proximal sigmoid colon. There is no evidence of diverticulitis. Vascular/Lymphatic: Scattered calcification is seen along the abdominal aorta and its branches, including at the proximal renal arteries bilaterally. The abdominal aorta is otherwise grossly unremarkable. The inferior vena cava is grossly unremarkable. No retroperitoneal lymphadenopathy is seen. No pelvic sidewall lymphadenopathy is identified. Reproductive: The bladder is decompressed, with a Foley catheter in place, and a small amount of contrast in the bladder. The uterus is grossly unremarkable. The ovaries are relatively symmetric. No suspicious adnexal masses are seen. Other: Diffuse soft-tissue phlegmon is noted at the right hip, with remodeling of the anterior right femur and acetabulum, and of the posterior sacrum and coccyx. Findings are compatible with chronic osteomyelitis and large sacral decubitus ulcerations. Mild phlegmon is noted at the left hip. Vague fluid is seen tracking along the musculature of the proximal right thigh. Presacral stranding is noted. Musculoskeletal: No acute osseous abnormalities are identified. The patient is status post thoracic spinal fusion. The visualized musculature is unremarkable in appearance. IMPRESSION: 1. Diffuse soft tissue phlegmon at the right hip. Remodeling of the anterior right femur and acetabulum, and at  the posterior sacrum and coccyx, compatible with chronic osteomyelitis, due to underlying large sacral decubitus ulcerations. Mild phlegmon at the left hip. Vague fluid tracks along the musculature of the proximal right thigh, without focal abscess. Presacral stranding noted. 2. Trace bilateral pleural effusions. Partial consolidation of the left lower lobe, which could reflect pneumonia. 3. Dilatation of the common bile duct to 8-9 mm in diameter, and distention of the pancreatic duct to 5 mm, raising concern for distal obstruction. MRCP or ERCP could be considered for further evaluation, as deemed clinically appropriate. 4. Cholelithiasis. 5. Cardiomegaly.  Scattered coronary artery calcifications noted. 6. 6 mm nonspecific hypodensity within the left hepatic lobe. 7. Scattered diverticulosis along the cecum, and ascending, transverse, descending and proximal sigmoid colon. Right upper quadrant colostomy is unremarkable in appearance. No evidence of diverticulitis. 8. Scattered aortic atherosclerosis. Calcification at the proximal renal arteries bilaterally. Electronically Signed   By: Garald Balding M.D.   On: 08/15/2016 02:12   Mr Sacrum Si Joints W Wo Contrast  Result Date: 08/16/2016 CLINICAL DATA:  55 year old with fever, right sacral decubitus ulcer and extensive chronic inflammatory changes around the right hip on CT. Previous left above the knee amputation. EXAM: MRI OF THE RIGHT HIP WITHOUT AND WITH CONTRAST MR SACRUM AND SACROILIAC JOINTS WITHOUT AND WITH CONTRAST TECHNIQUE: Multiplanar, multisequence MR imaging was performed both before and after administration of intravenous contrast. CONTRAST:  10 ml MultiHance. COMPARISON:  CT 08/15/2016. FINDINGS: Bones/Joint/Cartilage : As correlated with CT, there is diffuse destruction, remodeling and sclerosis of the lower sacrum and coccyx, most consistent with chronic osteomyelitis. The sacroiliac joints and symphysis pubis are intact. Both hips are  located. There is extensive heterotopic ossification surrounding the right hip joint. There is an associated complex right hip joint effusion with a large peripherally enhancing fluid collection within the right adductor musculature. This fluid collection is best seen on the postcontrast images (series 37 through 42). It measures up to 3.9 x 12.7 x 3.2 cm. No other focal fluid collections are  seen. There is some marrow edema and enhancement posteriorly in both iliac bones. No evidence of proximal femoral osteomyelitis. Ligaments Not relevant for exam/indication. Muscles and Tendons There is asymmetric T2 hyperintensity and enhancement throughout the right pelvic and proximal thigh musculature. As above, there is a large fluid collection within the right hip adductor musculature. The iliopsoas and hamstring tendons appear grossly intact. Soft tissues There is decubitus soft tissue ulceration posteriorly over the sacrum and right hip joint. There is diffuse edema and enhancement throughout the adjacent soft tissues, but no other focal fluid collections. Foley catheter and a small amount of free pelvic fluid noted. IMPRESSION: 1. Chronic destruction and remodeling of the lower sacrum and coccyx consistent with chronic osteomyelitis. There is marrow edema and enhancement posteriorly in both iliac bones, suspicious for osteomyelitis. 2. Extensive heterotopic ossification and synovitis surrounding the right hip with large peripherally enhancing fluid collection in the right hip adductor musculature, worrisome for soft tissue abscess. No specific evidence of active osteomyelitis at the right hip. 3. Multifocal decubitus ulceration in the pelvis with associated generalized soft tissue inflammatory changes. Electronically Signed   By: Richardean Sale M.D.   On: 08/16/2016 11:31    Scheduled Meds: . aspirin  81 mg Oral Daily  . chlorhexidine  15 mL Mouth Rinse BID  . enoxaparin (LOVENOX) injection  40 mg Subcutaneous  Q24H  . escitalopram  10 mg Per Tube Daily  . feeding supplement (PRO-STAT SUGAR FREE 64)  30 mL Per Tube TID WC  . folic acid-pyridoxine-cyancobalamin  1 tablet Per Tube Daily  . fosfomycin  3 g Oral Q72H  . free water  200 mL Per Tube Q8H  . mouth rinse  15 mL Mouth Rinse q12n4p  . meropenem (MERREM) IV  1 g Intravenous Q12H  . metoprolol tartrate  25 mg Per Tube BID  . multivitamin  1 tablet Oral Daily  . pantoprazole sodium  40 mg Per Tube Daily  . polyethylene glycol  17 g Per Tube Daily  . pravastatin  40 mg Per Tube Daily  . Valproate Sodium  200 mg Per Tube Q8H  . ascorbic acid  500 mg Oral Daily  . zinc sulfate  220 mg Oral Daily   Continuous Infusions: . feeding supplement (OSMOLITE 1.5 CAL) 1,000 mL (08/15/16 1405)     LOS: 8 days   Time spent: 25 minutes.  Vance Gather, MD [facility] Pager [phone]  If 7PM-7AM, please contact night-coverage [email or web address] Password [email or web address] 08/16/2016, 12:54 PM

## 2016-08-17 DIAGNOSIS — M8668 Other chronic osteomyelitis, other site: Secondary | ICD-10-CM

## 2016-08-17 DIAGNOSIS — L8944 Pressure ulcer of contiguous site of back, buttock and hip, stage 4: Secondary | ICD-10-CM

## 2016-08-17 DIAGNOSIS — G9340 Encephalopathy, unspecified: Secondary | ICD-10-CM

## 2016-08-17 LAB — CBC
HCT: 30 % — ABNORMAL LOW (ref 36.0–46.0)
Hemoglobin: 8.9 g/dL — ABNORMAL LOW (ref 12.0–15.0)
MCH: 29.1 pg (ref 26.0–34.0)
MCHC: 29.7 g/dL — ABNORMAL LOW (ref 30.0–36.0)
MCV: 98 fL (ref 78.0–100.0)
PLATELETS: 369 10*3/uL (ref 150–400)
RBC: 3.06 MIL/uL — AB (ref 3.87–5.11)
RDW: 20.2 % — AB (ref 11.5–15.5)
WBC: 9.7 10*3/uL (ref 4.0–10.5)

## 2016-08-17 LAB — GLUCOSE, CAPILLARY
GLUCOSE-CAPILLARY: 123 mg/dL — AB (ref 65–99)
GLUCOSE-CAPILLARY: 128 mg/dL — AB (ref 65–99)
Glucose-Capillary: 127 mg/dL — ABNORMAL HIGH (ref 65–99)

## 2016-08-17 MED ORDER — VANCOMYCIN HCL IN DEXTROSE 750-5 MG/150ML-% IV SOLN
750.0000 mg | INTRAVENOUS | Status: DC
  Administered 2016-08-18 – 2016-08-19 (×2): 750 mg via INTRAVENOUS
  Filled 2016-08-17 (×3): qty 150

## 2016-08-17 MED ORDER — VANCOMYCIN HCL IN DEXTROSE 1-5 GM/200ML-% IV SOLN
1000.0000 mg | Freq: Once | INTRAVENOUS | Status: AC
  Administered 2016-08-17: 1000 mg via INTRAVENOUS
  Filled 2016-08-17: qty 200

## 2016-08-17 NOTE — Progress Notes (Signed)
PROGRESS NOTE  Katherine Palmer  KZL:[id number] DOB: 07-14-1961 DOA: 08/08/2016 PCP: Gildardo Cranker, DO   Brief Narrative: Patient is a 55 y.o.female with history of CVA with left hemiparesis, with left AKA, chronic right lower extremity contracture, stage IV decub ulcer with colostomy and PEG tube for feeding due to dysphagia, seizure disorder and HTNwas brought to the ER from the skilled nursing facility after patient was found to have altered mental status. She was thought to be encephalopathic from a UTI. She was subsequently admitted for further evaluation and treatment. IV antibiotics were tailored to urine culture results showing ESBL E.coli and pseudomonas, but fevers continued. CT abdomen and pelvis showed evidence of pyelonephritis and MRI of hips and sacrum due to chronic decubitus ulcers showed evidence of osteomyelitis and soft tissue abscess around the right hip.   Assessment & Plan: Principal Problem:   Acute encephalopathy Active Problems:   Hemiparesis affecting left side as late effect of cerebrovascular accident (CVA) ([place])   Essential hypertension, benign   Dysphagia due to old cerebrovascular accident   S/P AKA (above knee amputation) ([place])   Stage IV decubitus ulcer ([facility])   Urinary tract infection associated with indwelling urethral catheter ([facility])   Altered mental status  Sepsis from ESBL E Coli and Pseudomonas pyelonephritis: Sepsis improving but not resolving, still febrile but hemodynamically stable. CT showed left perinephric stranding as well as concern for LLL consolidation though suspicion for PNA remains low. UCx with >100,000 CFU ESBL Escherichia coli and >50,000 CFU of Pseudomonas Aeruginosa. Suspect zosyn (with MIC 32) was inadequately treating infections.  - ID consulted, restarted meropenem, fever curve stable. - Blood culltures 11/23 no growth and 11/29 NGTD  Deep fluid collection in right hip/adductor: With recurrent fevers despite IV antibiotics, suspect  this is etiology.  - Consult orthopedic surgery on-call, Dr. Ninfa Linden. - Could consider aspiration under fluoro   Chronic osteomyelitis due to stage IV decubitus ulcers: Present PTA. See pictures taken on 11/24 by Dr. Sloan Leiter. - Appreciate WOC assistance: wounds unchanged since admission with exposed bone - MRI shows chronic osteomyelitis of sacrum and coccyx with concern for acute osteomyelitis in iliac bones and enhancing fluid collection in right hipp adductor musculature, possibly deep tissue abscess.  - No need for debridement per general surgery. - ESR 94, CRP 1.3.   Biliary and pancreatic duct dilitation: With normal lipase and LFTs, doubt this is of clinical significance.   Acute metabolic encephalopathy: EEG not performed due to hair matting.  - Improving.Suspect likely due to sepsis on abnormal baseline.  Hypertension:  -Continue with Metoprolol 25 mg po per PEG BID -C/w Hydralazine 10 mg IV q4hprn for SBP > 160   Hyperkalemia:Resolved - Repeat BMP in AM  History of CVA with residual left-sided Hemiplegia, Dysarthria and Dysphagia:  -PEG tube in place -Continue Aspirin 81 mg and Statin.   Seizure Disorder:  -Continue Valproate Sodium 200 mg Per PEG q8h: level was < 10 on admission (goal 50-100)  Depression -C/w Escitalopram 10 mg Per PEG Daily  S/P Left AKA (above knee amputation)  DVT prophylaxis: Lovenox Code Status: Full Family Communication: None at bedside this AM Disposition Plan: SNF once sepsis resolved, requiring IV antibiotics.   Consultants:   Infectious disease, Dr. Johnnye Sima by phone 11/27 and Dr. Baxter Flattery by phone 11/28.  ID, Dr. Baxter Flattery formally consulted 11/29.  Orthopedic surgery, Dr. Ninfa Linden  General surgery 12/1, Dr. Rosendo Gros  Procedures:   None  Antimicrobials:  Ceftriaxone 11/23  Ceftazidime 11/23 > 11/26  Meropenem  11/26 > 11/28, restarted 11/29  Zosyn 11/27 > 11/28  Fosfomycin 11/28 >> 11/29  Subjective: Pt again  reports "my butt hurts" and generally feeling "bad." without further explanation. Febrile again yesterday. Denies chest pain, cough, wheezing, dyspnea.   Objective: Vitals:   08/16/16 1353 08/16/16 1636 08/16/16 2127 08/17/16 0607  BP: 121/71  (!) 154/83 (!) 153/97  Pulse: 94  (!) 111 (!) 110  Resp:   18 18  Temp: (!) 101.1 F (38.4 C) 98.7 F (37.1 C) 99.9 F (37.7 C) 97.5 F (36.4 C)  TempSrc: Oral Oral Oral Oral  SpO2: 96%  99% 100%  Weight:        Intake/Output Summary (Last 24 hours) at 08/17/16 1050 Last data filed at 08/17/16 5784  Gross per 24 hour  Intake             1690 ml  Output             2050 ml  Net             -360 ml   Filed Weights   08/13/16 1005  Weight: 47.6 kg (104 lb 15 oz)    Examination: General exam: 55 y.o. female in no distress appearing older than stated age. Respiratory system: Non-labored breathing room air. Diminished but clear to auscultation bilaterally.  Cardiovascular system: Tachycardic rate and normal rhythm. No murmur, rub, or gallop. No JVD, and no edema. Gastrointestinal system: Abdomen soft, non-tender, non-distended, with normoactive bowel sounds. No organomegaly or masses felt. Colostomy site on right abdomen nontender with clear brown contents. PEG in left abdomen nontender without discharge.  Central nervous system: Awake, not oriented, doesn't believe she's in the hospital. No focal neurological deficits. Extremities: Left high AKA, right leg contracted with tenderness to palpation along proximal musculature without focal abscess palpated. Skin: Decubitus ulcer with c/d/i dressing pad not removed today. Last evaluated by me 12/1.  Psychiatry: UTD  Data Reviewed: I have personally reviewed following labs and imaging studies  CBC:  Recent Labs Lab 08/13/16 0533 08/14/16 0605 08/15/16 0524 08/16/16 0415 08/17/16 0439  WBC 9.9 9.9 10.0 8.8 9.7  NEUTROABS 5.2  --   --   --   --   HGB 9.3* 9.2* 9.8* 8.8* 8.9*  HCT 32.2*  31.9* 34.1* 30.5* 30.0*  MCV 100.6* 100.9* 100.9* 100.3* 98.0  PLT 244 303 331 316 696   Basic Metabolic Panel:  Recent Labs Lab 08/11/16 0542 08/12/16 0634 08/13/16 0533 08/14/16 0605 08/15/16 0524  NA 142 144 145 144 140  K 5.5* 4.7 4.2 4.1 4.1  CL 108 110 113* 112* 112*  CO2 _0 GLUCOSE 114* 127* 120* 131* 135*  BUN 19 20 22* 19 21*  CREATININE 0.34* 0.32* 0.34* 0.36* <0.30*  CALCIUM 8.0* 8.0* 8.1* 8.2* 8.3*  MG  --   --  1.8  --   --   PHOS  --   --  2.3*  --   --    GFR: CrCl cannot be calculated (This lab value cannot be used to calculate CrCl because it is not a number: <0.30). Liver Function Tests:  Recent Labs Lab 08/13/16 0533 08/14/16 0605  AST 31 29  ALT 19 22  ALKPHOS 65 63  BILITOT 0.2* 0.2*  PROT 5.9* 6.3*  ALBUMIN 1.3* 1.4*    Recent Labs Lab 08/15/16 0836  LIPASE 23   No results for input(s): AMMONIA in the last 168 hours. CBG:  Recent Labs  Lab 08/16/16 0242 08/16/16 0807 08/16/16 1634 08/17/16 0045 08/17/16 0822  GLUCAP 103* 118* 129* 128* 127*   Lipid Profile: No results for input(s): CHOL, HDL, LDLCALC, TRIG, CHOLHDL, LDLDIRECT in the last 72 hours. Thyroid Function Tests: No results for input(s): TSH, T4TOTAL, FREET4, T3FREE, THYROIDAB in the last 72 hours. Anemia Panel: No results for input(s): VITAMINB12, FOLATE, FERRITIN, TIBC, IRON, RETICCTPCT in the last 72 hours. Urine analysis:    Component Value Date/Time   COLORURINE AMBER (A) 08/08/2016 0115   APPEARANCEUR CLOUDY (A) 08/08/2016 0115   LABSPEC 1.024 08/08/2016 0115   PHURINE 5.5 08/08/2016 0115   GLUCOSEU NEGATIVE 08/08/2016 0115   HGBUR MODERATE (A) 08/08/2016 0115   BILIRUBINUR NEGATIVE 08/08/2016 0115   KETONESUR NEGATIVE 08/08/2016 0115   PROTEINUR 30 (A) 08/08/2016 0115   NITRITE POSITIVE (A) 08/08/2016 0115   LEUKOCYTESUR MODERATE (A) 08/08/2016 0115   Sepsis Labs: _0 (procalcitonin:4,lacticidven:4)  ) Recent Results (from the past  240 hour(s))  Blood Culture (routine x 2)     Status: None   Collection Time: 08/08/16 12:35 AM  Result Value Ref Range Status   Specimen Description BLOOD RIGHT HAND  Final   Special Requests IN PEDIATRIC BOTTLE 3ML  Final   Culture NO GROWTH 5 DAYS  Final   Report Status 08/13/2016 FINAL  Final  Blood Culture (routine x 2)     Status: None   Collection Time: 08/08/16 12:50 AM  Result Value Ref Range Status   Specimen Description BLOOD RIGHT HAND  Final   Special Requests BOTTLES DRAWN AEROBIC AND ANAEROBIC 5ML  Final   Culture NO GROWTH 5 DAYS  Final   Report Status 08/13/2016 FINAL  Final  Urine culture     Status: Abnormal   Collection Time: 08/08/16  1:15 AM  Result Value Ref Range Status   Specimen Description URINE, RANDOM  Final   Special Requests NONE  Final   Culture (A)  Final    >=100,000 COLONIES/mL ESCHERICHIA COLI Confirmed Extended Spectrum Beta-Lactamase Producer (ESBL) 50,000 COLONIES/mL PSEUDOMONAS AERUGINOSA    Report Status 08/12/2016 FINAL  Final   Organism ID, Bacteria ESCHERICHIA COLI (A)  Final   Organism ID, Bacteria PSEUDOMONAS AERUGINOSA (A)  Final      Susceptibility   Escherichia coli - MIC*    AMPICILLIN >=32 RESISTANT Resistant     CEFAZOLIN >=64 RESISTANT Resistant     CEFTRIAXONE >=64 RESISTANT Resistant     CIPROFLOXACIN >=4 RESISTANT Resistant     GENTAMICIN <=1 SENSITIVE Sensitive     IMIPENEM <=0.25 SENSITIVE Sensitive     NITROFURANTOIN <=16 SENSITIVE Sensitive     TRIMETH/SULFA <=20 SENSITIVE Sensitive     AMPICILLIN/SULBACTAM >=32 RESISTANT Resistant     PIP/TAZO 8 SENSITIVE Sensitive     Extended ESBL POSITIVE Resistant     * >=100,000 COLONIES/mL ESCHERICHIA COLI   Pseudomonas aeruginosa - MIC*    CEFTAZIDIME 4 SENSITIVE Sensitive     CIPROFLOXACIN 2 INTERMEDIATE Intermediate     GENTAMICIN <=1 SENSITIVE Sensitive     IMIPENEM >=16 RESISTANT Resistant     PIP/TAZO 32 SENSITIVE Sensitive     CEFEPIME 4 SENSITIVE Sensitive      * 50,000 COLONIES/mL PSEUDOMONAS AERUGINOSA  Culture, blood (routine x 2)     Status: None (Preliminary result)   Collection Time: 08/14/16  8:02 PM  Result Value Ref Range Status   Specimen Description BLOOD RIGHT HAND  Final   Special Requests IN PEDIATRIC BOTTLE 3CC  Final  Culture NO GROWTH 2 DAYS  Final   Report Status PENDING  Incomplete  Culture, blood (routine x 2)     Status: None (Preliminary result)   Collection Time: 08/14/16  8:02 PM  Result Value Ref Range Status   Specimen Description BLOOD RIGHT HAND  Final   Special Requests IN PEDIATRIC BOTTLE 3CC  Final   Culture NO GROWTH 2 DAYS  Final   Report Status PENDING  Incomplete     Radiology Studies: Mr Hip Right W Wo Contrast  Result Date: 08/16/2016 CLINICAL DATA:  55 year old with fever, right sacral decubitus ulcer and extensive chronic inflammatory changes around the right hip on CT. Previous left above the knee amputation. EXAM: MRI OF THE RIGHT HIP WITHOUT AND WITH CONTRAST MR SACRUM AND SACROILIAC JOINTS WITHOUT AND WITH CONTRAST TECHNIQUE: Multiplanar, multisequence MR imaging was performed both before and after administration of intravenous contrast. CONTRAST:  10 ml MultiHance. COMPARISON:  CT 08/15/2016. FINDINGS: Bones/Joint/Cartilage : As correlated with CT, there is diffuse destruction, remodeling and sclerosis of the lower sacrum and coccyx, most consistent with chronic osteomyelitis. The sacroiliac joints and symphysis pubis are intact. Both hips are located. There is extensive heterotopic ossification surrounding the right hip joint. There is an associated complex right hip joint effusion with a large peripherally enhancing fluid collection within the right adductor musculature. This fluid collection is best seen on the postcontrast images (series 37 through 42). It measures up to 3.9 x 12.7 x 3.2 cm. No other focal fluid collections are seen. There is some marrow edema and enhancement posteriorly in both iliac  bones. No evidence of proximal femoral osteomyelitis. Ligaments Not relevant for exam/indication. Muscles and Tendons There is asymmetric T2 hyperintensity and enhancement throughout the right pelvic and proximal thigh musculature. As above, there is a large fluid collection within the right hip adductor musculature. The iliopsoas and hamstring tendons appear grossly intact. Soft tissues There is decubitus soft tissue ulceration posteriorly over the sacrum and right hip joint. There is diffuse edema and enhancement throughout the adjacent soft tissues, but no other focal fluid collections. Foley catheter and a small amount of free pelvic fluid noted. IMPRESSION: 1. Chronic destruction and remodeling of the lower sacrum and coccyx consistent with chronic osteomyelitis. There is marrow edema and enhancement posteriorly in both iliac bones, suspicious for osteomyelitis. 2. Extensive heterotopic ossification and synovitis surrounding the right hip with large peripherally enhancing fluid collection in the right hip adductor musculature, worrisome for soft tissue abscess. No specific evidence of active osteomyelitis at the right hip. 3. Multifocal decubitus ulceration in the pelvis with associated generalized soft tissue inflammatory changes. Electronically Signed   By: Richardean Sale M.D.   On: 08/16/2016 11:31   Mr Jones Broom Si Joints W Wo Contrast  Result Date: 08/16/2016 CLINICAL DATA:  55 year old with fever, right sacral decubitus ulcer and extensive chronic inflammatory changes around the right hip on CT. Previous left above the knee amputation. EXAM: MRI OF THE RIGHT HIP WITHOUT AND WITH CONTRAST MR SACRUM AND SACROILIAC JOINTS WITHOUT AND WITH CONTRAST TECHNIQUE: Multiplanar, multisequence MR imaging was performed both before and after administration of intravenous contrast. CONTRAST:  10 ml MultiHance. COMPARISON:  CT 08/15/2016. FINDINGS: Bones/Joint/Cartilage : As correlated with CT, there is diffuse  destruction, remodeling and sclerosis of the lower sacrum and coccyx, most consistent with chronic osteomyelitis. The sacroiliac joints and symphysis pubis are intact. Both hips are located. There is extensive heterotopic ossification surrounding the right hip joint. There is an associated  complex right hip joint effusion with a large peripherally enhancing fluid collection within the right adductor musculature. This fluid collection is best seen on the postcontrast images (series 37 through 42). It measures up to 3.9 x 12.7 x 3.2 cm. No other focal fluid collections are seen. There is some marrow edema and enhancement posteriorly in both iliac bones. No evidence of proximal femoral osteomyelitis. Ligaments Not relevant for exam/indication. Muscles and Tendons There is asymmetric T2 hyperintensity and enhancement throughout the right pelvic and proximal thigh musculature. As above, there is a large fluid collection within the right hip adductor musculature. The iliopsoas and hamstring tendons appear grossly intact. Soft tissues There is decubitus soft tissue ulceration posteriorly over the sacrum and right hip joint. There is diffuse edema and enhancement throughout the adjacent soft tissues, but no other focal fluid collections. Foley catheter and a small amount of free pelvic fluid noted. IMPRESSION: 1. Chronic destruction and remodeling of the lower sacrum and coccyx consistent with chronic osteomyelitis. There is marrow edema and enhancement posteriorly in both iliac bones, suspicious for osteomyelitis. 2. Extensive heterotopic ossification and synovitis surrounding the right hip with large peripherally enhancing fluid collection in the right hip adductor musculature, worrisome for soft tissue abscess. No specific evidence of active osteomyelitis at the right hip. 3. Multifocal decubitus ulceration in the pelvis with associated generalized soft tissue inflammatory changes. Electronically Signed   By: Richardean Sale M.D.   On: 08/16/2016 11:31    Scheduled Meds: . aspirin  81 mg Oral Daily  . chlorhexidine  15 mL Mouth Rinse BID  . enoxaparin (LOVENOX) injection  40 mg Subcutaneous Q24H  . escitalopram  10 mg Per Tube Daily  . feeding supplement (PRO-STAT SUGAR FREE 64)  30 mL Per Tube TID WC  . folic acid-pyridoxine-cyancobalamin  1 tablet Per Tube Daily  . fosfomycin  3 g Oral Q72H  . free water  200 mL Per Tube Q8H  . mouth rinse  15 mL Mouth Rinse q12n4p  . meropenem (MERREM) IV  1 g Intravenous Q12H  . metoprolol tartrate  25 mg Per Tube BID  . multivitamin  1 tablet Oral Daily  . pantoprazole sodium  40 mg Per Tube Daily  . polyethylene glycol  17 g Per Tube Daily  . pravastatin  40 mg Per Tube Daily  . Valproate Sodium  200 mg Per Tube Q8H  . ascorbic acid  500 mg Oral Daily  . zinc sulfate  220 mg Oral Daily   Continuous Infusions: . feeding supplement (OSMOLITE 1.5 CAL) 1,000 mL (08/17/16 0829)     LOS: 9 days   Time spent: 25 minutes.  Vance Gather, MD [facility] Pager [phone]  If 7PM-7AM, please contact night-coverage [email or web address] Password [email or web address] 08/17/2016, 10:50 AM

## 2016-08-17 NOTE — Consult Note (Signed)
Reason for Consult: right hip soft-tissue fluid collection; persistent fevers. Referring Physician: Jarvis Newcomer, MS  Katherine Palmer is an 55 y.o. female.  HPI:   55 yo female with multiple medical problems who is bed-bound with sacral decubs as well as other significant medical issues.  Has been in the hospital due to persistent fevers.  A MRI of her right hip was obtained showing chronic changes of the bone, but also a fluid collection around the hip adductor muscles.  Ortho is consulted for recommendations.  Past Medical History:  Diagnosis Date  . Decubitus ulcer of sacral region, stage 4 (HCC)   . Depression   . Dysphagia   . Dysphagia as late effect of cerebrovascular disease   . Hyperlipidemia   . Hypertension   . Seizures (HCC)   . Stroke [facility])     Past Surgical History:  Procedure Laterality Date  . 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 COLOSTOMY    . PEG TUBE PLACEMENT      Family History  Problem Relation Age of Onset  . Hypertension Other     Social History:  reports that she has quit smoking. She has never used smokeless tobacco. She reports that she does not drink alcohol or use drugs.  Allergies: No Known Allergies  Medications: I have reviewed the patient's current medications.  Results for orders placed or performed during the hospital encounter of 08/08/16 (from the past 48 hour(s))  Sedimentation rate     Status: Abnormal   Collection Time: 08/15/16  2:35 PM  Result Value Ref Range   Sed Rate 94 (H) 0 - 22 mm/hr  C-reactive protein     Status: Abnormal   Collection Time: 08/15/16  4:02 PM  Result Value Ref Range   CRP 1.3 (H) <1.0 mg/dL  Glucose, capillary     Status: Abnormal   Collection Time: 08/16/16  2:42 AM  Result Value Ref Range   Glucose-Capillary 103 (H) 65 - 99 mg/dL  CBC     Status: Abnormal   Collection Time: 08/16/16  4:15 AM  Result Value Ref Range   WBC 8.8 4.0 - 10.5 K/uL   RBC 3.04 (L) 3.87 - 5.11 MIL/uL   Hemoglobin 8.8 (L) 12.0 -  15.0 g/dL   HCT 16.1 (L) 09.6 - 04.5 %   MCV 100.3 (H) 78.0 - 100.0 fL   MCH 28.9 26.0 - 34.0 pg   MCHC 28.9 (L) 30.0 - 36.0 g/dL   RDW 40.9 (H) 81.1 - 91.4 %   Platelets 316 150 - 400 K/uL  Glucose, capillary     Status: Abnormal   Collection Time: 08/16/16  8:07 AM  Result Value Ref Range   Glucose-Capillary 118 (H) 65 - 99 mg/dL  Glucose, capillary     Status: Abnormal   Collection Time: 08/16/16  4:34 PM  Result Value Ref Range   Glucose-Capillary 129 (H) 65 - 99 mg/dL  Glucose, capillary     Status: Abnormal   Collection Time: 08/17/16 12:45 AM  Result Value Ref Range   Glucose-Capillary 128 (H) 65 - 99 mg/dL  CBC     Status: Abnormal   Collection Time: 08/17/16  4:39 AM  Result Value Ref Range   WBC 9.7 4.0 - 10.5 K/uL   RBC 3.06 (L) 3.87 - 5.11 MIL/uL   Hemoglobin 8.9 (L) 12.0 - 15.0 g/dL   HCT 78.2 (L) 95.6 - 21.3 %   MCV 98.0 78.0 - 100.0 fL   MCH 29.1  26.0 - 34.0 pg   MCHC 29.7 (L) 30.0 - 36.0 g/dL   RDW 16.120.2 (H) 09.611.5 - 04.515.5 %   Platelets 369 150 - 400 K/uL  Glucose, capillary     Status: Abnormal   Collection Time: 08/17/16  8:22 AM  Result Value Ref Range   Glucose-Capillary 127 (H) 65 - 99 mg/dL    Mr Hip Right W Wo Contrast  Result Date: 08/16/2016 CLINICAL DATA:  55 year old with fever, right sacral decubitus ulcer and extensive chronic inflammatory changes around the right hip on CT. Previous left above the knee amputation. EXAM: MRI OF THE RIGHT HIP WITHOUT AND WITH CONTRAST MR SACRUM AND SACROILIAC JOINTS WITHOUT AND WITH CONTRAST TECHNIQUE: Multiplanar, multisequence MR imaging was performed both before and after administration of intravenous contrast. CONTRAST:  10 ml MultiHance. COMPARISON:  CT 08/15/2016. FINDINGS: Bones/Joint/Cartilage : As correlated with CT, there is diffuse destruction, remodeling and sclerosis of the lower sacrum and coccyx, most consistent with chronic osteomyelitis. The sacroiliac joints and symphysis pubis are intact. Both hips are  located. There is extensive heterotopic ossification surrounding the right hip joint. There is an associated complex right hip joint effusion with a large peripherally enhancing fluid collection within the right adductor musculature. This fluid collection is best seen on the postcontrast images (series 37 through 42). It measures up to 3.9 x 12.7 x 3.2 cm. No other focal fluid collections are seen. There is some marrow edema and enhancement posteriorly in both iliac bones. No evidence of proximal femoral osteomyelitis. Ligaments Not relevant for exam/indication. Muscles and Tendons There is asymmetric T2 hyperintensity and enhancement throughout the right pelvic and proximal thigh musculature. As above, there is a large fluid collection within the right hip adductor musculature. The iliopsoas and hamstring tendons appear grossly intact. Soft tissues There is decubitus soft tissue ulceration posteriorly over the sacrum and right hip joint. There is diffuse edema and enhancement throughout the adjacent soft tissues, but no other focal fluid collections. Foley catheter and a small amount of free pelvic fluid noted. IMPRESSION: 1. Chronic destruction and remodeling of the lower sacrum and coccyx consistent with chronic osteomyelitis. There is marrow edema and enhancement posteriorly in both iliac bones, suspicious for osteomyelitis. 2. Extensive heterotopic ossification and synovitis surrounding the right hip with large peripherally enhancing fluid collection in the right hip adductor musculature, worrisome for soft tissue abscess. No specific evidence of active osteomyelitis at the right hip. 3. Multifocal decubitus ulceration in the pelvis with associated generalized soft tissue inflammatory changes. Electronically Signed   By: Carey BullocksWilliam  Veazey M.D.   On: 08/16/2016 11:31   Mr Ashok CordiaSacrum Si Joints W Wo Contrast  Result Date: 08/16/2016 CLINICAL DATA:  55 year old with fever, right sacral decubitus ulcer and extensive  chronic inflammatory changes around the right hip on CT. Previous left above the knee amputation. EXAM: MRI OF THE RIGHT HIP WITHOUT AND WITH CONTRAST MR SACRUM AND SACROILIAC JOINTS WITHOUT AND WITH CONTRAST TECHNIQUE: Multiplanar, multisequence MR imaging was performed both before and after administration of intravenous contrast. CONTRAST:  10 ml MultiHance. COMPARISON:  CT 08/15/2016. FINDINGS: Bones/Joint/Cartilage : As correlated with CT, there is diffuse destruction, remodeling and sclerosis of the lower sacrum and coccyx, most consistent with chronic osteomyelitis. The sacroiliac joints and symphysis pubis are intact. Both hips are located. There is extensive heterotopic ossification surrounding the right hip joint. There is an associated complex right hip joint effusion with a large peripherally enhancing fluid collection within the right adductor musculature. This fluid  collection is best seen on the postcontrast images (series 37 through 42). It measures up to 3.9 x 12.7 x 3.2 cm. No other focal fluid collections are seen. There is some marrow edema and enhancement posteriorly in both iliac bones. No evidence of proximal femoral osteomyelitis. Ligaments Not relevant for exam/indication. Muscles and Tendons There is asymmetric T2 hyperintensity and enhancement throughout the right pelvic and proximal thigh musculature. As above, there is a large fluid collection within the right hip adductor musculature. The iliopsoas and hamstring tendons appear grossly intact. Soft tissues There is decubitus soft tissue ulceration posteriorly over the sacrum and right hip joint. There is diffuse edema and enhancement throughout the adjacent soft tissues, but no other focal fluid collections. Foley catheter and a small amount of free pelvic fluid noted. IMPRESSION: 1. Chronic destruction and remodeling of the lower sacrum and coccyx consistent with chronic osteomyelitis. There is marrow edema and enhancement posteriorly in  both iliac bones, suspicious for osteomyelitis. 2. Extensive heterotopic ossification and synovitis surrounding the right hip with large peripherally enhancing fluid collection in the right hip adductor musculature, worrisome for soft tissue abscess. No specific evidence of active osteomyelitis at the right hip. 3. Multifocal decubitus ulceration in the pelvis with associated generalized soft tissue inflammatory changes. Electronically Signed   By: Carey BullocksWilliam  Veazey M.D.   On: 08/16/2016 11:31    ROS Blood pressure (!) 153/97, pulse (!) 110, temperature 97.5 F (36.4 C), temperature source Oral, resp. rate 18, weight 104 lb 15 oz (47.6 kg), SpO2 100 %. Physical Exam  Musculoskeletal:       Right hip: She exhibits decreased range of motion, decreased strength and bony tenderness.  Neurological: She is alert.   Her right thigh/hip/proximal femur skin is intact and there are no open wounds other than her sacral decubitus ulcers.  The is no redness or induration.   Assessment/Plan: 1) Right proximal hip soft-tissue abscess/fluid collection in a patient with multiple medical problems, chronic osteo of the sacrum and pelvis with recurrent fevers.  I feel that the most appropriate treatment for now would be to have Interventional Radiology try to drain this fluid collection.  This would offer the least morbidity from a soft-tissue standpoint.  There is no evidence of osteo around her right hip.  I am concerned that she will have difficulty healing any type of large surgical incision given her current status.  Also, she does have chronic osteo in her pelvis and this can also contribute to her current situation. Would continue long-term antibiotics as well.  Will follow.   Katherine Palmer 08/17/2016, 11:33 AM

## 2016-08-17 NOTE — Progress Notes (Signed)
Pharmacy Antibiotic Note  Katherine Palmer is a 55 y.o. female admitted on 08/08/2016 to restart meropenem per ID in the setting of Pseudomonas/ESBL E.coli UTI with persistent fever. Continuing course of fosfomycin to cover pseudomonas. MRI indicates osteo. ID planned on 6 wks of vanc and merrem. Vanc protocol wasn't order so I'll put one in. We have dosed her for vanc before. She has a low weight but she has required a higher dose in the past.   Plan: -Meropenem 1g q12h  -Continue Fosfomycin 3g q72h x3 doses Vanc 1g IV x1 then 750mg  q24  Antimicrobials this admission:  11/23 CTX>> 11/23 11/23 ceftaz >> 11/26 11/28 fosfomycin >> (12/7) 11/26 Meropenem >> 11/27; 11/29 >> 12/2 vanc>>  Dose adjustments this admission:    Microbiology results:  11/23 urine cx >> > 100,000 colonies ESBL E.Coli, 50,000 colonies Pseudomonas (R-cipro, R-imipenem) 11/23 blood cx >> NGF 11/29 blood>>  Previous Admission: 11/3 (old) urine cx >> 40,000 pseudomonas (R to cipro and imipenem) 10/31-11/4 CTX > 11/4-11/6 cefepime  10/31-11/2 Vanc   Temp (24hrs), Avg:99.3 F (37.4 C), Min:97.5 F (36.4 C), Max:101.1 F (38.4 C)   Recent Labs Lab 08/11/16 0542 08/12/16 0634 08/13/16 0533 08/14/16 0605 08/15/16 0524 08/16/16 0415 08/17/16 0439  WBC 9.6  --  9.9 9.9 10.0 8.8 9.7  CREATININE 0.34* 0.32* 0.34* 0.36* <0.30*  --   --     No Known Allergies   Katherine Palmer, PharmD, BCPS, AAHIVP, CPP Infectious Disease Pharmacist Pager: [phone] 08/17/2016 1:15 PM

## 2016-08-18 LAB — GLUCOSE, CAPILLARY
GLUCOSE-CAPILLARY: 117 mg/dL — AB (ref 65–99)
GLUCOSE-CAPILLARY: 109 mg/dL — AB (ref 65–99)
Glucose-Capillary: 120 mg/dL — ABNORMAL HIGH (ref 65–99)

## 2016-08-18 LAB — CBC
HEMATOCRIT: 31.5 % — AB (ref 36.0–46.0)
HEMOGLOBIN: 9.4 g/dL — AB (ref 12.0–15.0)
MCH: 29.3 pg (ref 26.0–34.0)
MCHC: 29.8 g/dL — AB (ref 30.0–36.0)
MCV: 98.1 fL (ref 78.0–100.0)
Platelets: 397 10*3/uL (ref 150–400)
RBC: 3.21 MIL/uL — ABNORMAL LOW (ref 3.87–5.11)
RDW: 20 % — ABNORMAL HIGH (ref 11.5–15.5)
WBC: 9.7 10*3/uL (ref 4.0–10.5)

## 2016-08-18 NOTE — Progress Notes (Signed)
PROGRESS NOTE  Katherine Palmer  YHC:[id number] DOB: [birth year] DOA: 08/08/2016 PCP: Gildardo Cranker, DO   Brief Narrative: Patient is a 55 y.o.female with history of CVA with left hemiparesis, with left AKA, chronic right lower extremity contracture, stage IV decub ulcer with colostomy and PEG tube for feeding due to dysphagia, seizure disorder and HTNwas brought to the ER from the skilled nursing facility after patient was found to have altered mental status. She was thought to be encephalopathic from a UTI. She was subsequently admitted for further evaluation and treatment. IV antibiotics were tailored to urine culture results showing ESBL E.coli and pseudomonas, but fevers continued. CT abdomen and pelvis showed evidence of pyelonephritis and MRI of hips and sacrum due to chronic decubitus ulcers showed evidence of osteomyelitis and soft tissue abscess around the right hip. Orthopedic surgery was consulted and recommended IR drainage which has been ordered. She continues to have relapsing fevers despite broad-spectrum antibiotics.  Assessment & Plan: Principal Problem:   Acute encephalopathy Active Problems:   Hemiparesis affecting left side as late effect of cerebrovascular accident (CVA) ([place])   Essential hypertension, benign   Dysphagia due to old cerebrovascular accident   S/P AKA (above knee amputation) ([place])   Stage IV decubitus ulcer (HCC)   Urinary tract infection associated with indwelling urethral catheter (HCC)   Altered mental status   Other chronic osteomyelitis, other site (HCC)  Sepsis from ESBL E Coli and Pseudomonas pyelonephritis: Sepsis improving but not resolving, still febrile but hemodynamically stable. CT showed left perinephric stranding as well as concern for LLL consolidation though suspicion for PNA remains low and abx would be covering HCAP pathogens regardless. UCx with >100,000 CFU ESBL Escherichia coli and >50,000 CFU of Pseudomonas Aeruginosa. Suspect zosyn  (with MIC 32) was inadequately treating infections.  - ID consulted, restarted meropenem/vancomycin. - Blood culltures 11/23 no growth and 11/29 NGTD  Deep fluid collection in right hip/adductor: With recurrent fevers despite IV antibiotics, suspect abscess is etiology.  - Orthopedic surgery, Dr. Ninfa Linden recommended IR drainage  - Tube feeds off after midnight, coags in AM for procedure in AM.  Chronic osteomyelitis due to stage IV decubitus ulcers: Present PTA. See pictures taken on 11/24 by Dr. Sloan Leiter. - Appreciate WOC assistance: wounds unchanged since admission with exposed bone - MRI shows chronic osteomyelitis of sacrum and coccyx with concern for acute osteomyelitis in iliac bones and enhancing fluid collection in right hipp adductor musculature, possibly deep tissue abscess.  - No need for debridement per general surgery. - ESR 94, CRP 1.3.   Biliary and pancreatic duct dilitation: With normal lipase and LFTs, doubt this is of clinical significance.   Acute metabolic encephalopathy: EEG not performed due to hair matting.  - Improving.Suspect likely due to sepsis on abnormal baseline.  Hypertension:  -Continue with Metoprolol 25 mg po per PEG BID -C/w Hydralazine 10 mg IV q4hprn for SBP > 160   Hyperkalemia:Resolved - Repeat BMP in AM  History of CVA with residual left-sided Hemiplegia, Dysarthria and Dysphagia:  -PEG tube in place -Continue Aspirin 81 mg and Statin.   Seizure Disorder:  -Continue Valproate Sodium 200 mg Per PEG q8h: level was < 10 on admission (goal 50-100)  Depression -C/w Escitalopram 10 mg Per PEG Daily  S/P Left AKA (above knee amputation)  DVT prophylaxis: Lovenox Code Status: Full Family Communication: None at bedside this AM Disposition Plan: SNF once sepsis resolved, requiring IV antibiotics, IR procedure 12/4.   Consultants:   Infectious disease, Dr. Johnnye Sima  by phone 11/27 and Dr. Baxter Flattery by phone 11/28.  ID, Dr. Baxter Flattery formally  consulted 11/29.  Orthopedic surgery, Dr. Ninfa Linden  General surgery 12/1, Dr. Rosendo Gros  Interventional radiology, Dr. Annamaria Boots  Procedures:   None  Antimicrobials:  Ceftriaxone 11/23  Ceftazidime 11/23 > 11/26  Meropenem 11/26 > 11/28, restarted 11/29  Zosyn 11/27 > 11/28  Fosfomycin 11/28 >> 11/29  Subjective: Pt remains confused, pain reported as controlled. Intermittently febrile. Denies chest pain, cough, wheezing, dyspnea.   Objective: Vitals:   08/17/16 2333 08/18/16 0627 08/18/16 1000 08/18/16 1059  BP: 137/82 125/73 131/77 140/77  Pulse: 98 93 97 97  Resp:  17    Temp: (!) 100.6 F (38.1 C) 99.5 F (37.5 C)    TempSrc: Axillary Oral    SpO2: 98% 98%    Weight:        Intake/Output Summary (Last 24 hours) at 08/18/16 1223 Last data filed at 08/18/16 0600  Gross per 24 hour  Intake             2426 ml  Output              550 ml  Net             1876 ml   Filed Weights   08/13/16 1005  Weight: 47.6 kg (104 lb 15 oz)    Examination: General exam: 55 y.o. female in no distress appearing older than stated age. Respiratory system: Non-labored breathing room air. Diminished but clear to auscultation bilaterally.  Cardiovascular system: Regular rate and normal rhythm. No murmur, rub, or gallop. No JVD, and no edema. Gastrointestinal system: Abdomen soft, non-tender, non-distended, with normoactive bowel sounds. No organomegaly or masses felt. Colostomy site on right abdomen nontender with clear brown contents. PEG in left abdomen nontender without discharge.  Central nervous system: Awake, not oriented, doesn't believe she's in the hospital. No focal neurological deficits. Extremities: Left high AKA, right leg contracted with tenderness to palpation along proximal musculature without focal abscess palpated. Skin: Decubitus ulcer with c/d/i dressing pad not removed today. Last evaluated by me 12/1.  Psychiatry: UTD  Data Reviewed: I have personally reviewed  following labs and imaging studies  CBC:  Recent Labs Lab 08/13/16 0533 08/14/16 0605 08/15/16 0524 08/16/16 0415 08/17/16 0439 08/18/16 0458  WBC 9.9 9.9 10.0 8.8 9.7 9.7  NEUTROABS 5.2  --   --   --   --   --   HGB 9.3* 9.2* 9.8* 8.8* 8.9* 9.4*  HCT 32.2* 31.9* 34.1* 30.5* 30.0* 31.5*  MCV 100.6* 100.9* 100.9* 100.3* 98.0 98.1  PLT 244 303 331 316 369 734   Basic Metabolic Panel:  Recent Labs Lab 08/12/16 0634 08/13/16 0533 08/14/16 0605 08/15/16 0524  NA 144 145 144 140  K 4.7 4.2 4.1 4.1  CL 110 113* 112* 112*  CO2 29 24 27 22   GLUCOSE 127* 120* 131* 135*  BUN 20 22* 19 21*  CREATININE 0.32* 0.34* 0.36* <0.30*  CALCIUM 8.0* 8.1* 8.2* 8.3*  MG  --  1.8  --   --   PHOS  --  2.3*  --   --    GFR: CrCl cannot be calculated (This lab value cannot be used to calculate CrCl because it is not a number: <0.30). Liver Function Tests:  Recent Labs Lab 08/13/16 0533 08/14/16 0605  AST 31 29  ALT 19 22  ALKPHOS 65 63  BILITOT 0.2* 0.2*  PROT 5.9* 6.3*  ALBUMIN 1.3* 1.4*  Recent Labs Lab 08/15/16 0836  LIPASE 23   No results for input(s): AMMONIA in the last 168 hours. CBG:  Recent Labs Lab 08/17/16 0045 08/17/16 0822 08/17/16 1635 08/18/16 0018 08/18/16 0800  GLUCAP 128* 127* 123* 120* 117*   Lipid Profile: No results for input(s): CHOL, HDL, LDLCALC, TRIG, CHOLHDL, LDLDIRECT in the last 72 hours. Thyroid Function Tests: No results for input(s): TSH, T4TOTAL, FREET4, T3FREE, THYROIDAB in the last 72 hours. Anemia Panel: No results for input(s): VITAMINB12, FOLATE, FERRITIN, TIBC, IRON, RETICCTPCT in the last 72 hours. Urine analysis:    Component Value Date/Time   COLORURINE AMBER (A) 08/08/2016 0115   APPEARANCEUR CLOUDY (A) 08/08/2016 0115   LABSPEC 1.024 08/08/2016 0115   PHURINE 5.5 08/08/2016 0115   GLUCOSEU NEGATIVE 08/08/2016 0115   HGBUR MODERATE (A) 08/08/2016 0115   BILIRUBINUR NEGATIVE 08/08/2016 0115   KETONESUR NEGATIVE  08/08/2016 0115   PROTEINUR 30 (A) 08/08/2016 0115   NITRITE POSITIVE (A) 08/08/2016 0115   LEUKOCYTESUR MODERATE (A) 08/08/2016 0115   Sepsis Labs: @LABRCNTIP (procalcitonin:4,lacticidven:4)  ) Recent Results (from the past 240 hour(s))  Culture, blood (routine x 2)     Status: None (Preliminary result)   Collection Time: 08/14/16  8:02 PM  Result Value Ref Range Status   Specimen Description BLOOD RIGHT HAND  Final   Special Requests IN PEDIATRIC BOTTLE 3CC  Final   Culture NO GROWTH 3 DAYS  Final   Report Status PENDING  Incomplete  Culture, blood (routine x 2)     Status: None (Preliminary result)   Collection Time: 08/14/16  8:02 PM  Result Value Ref Range Status   Specimen Description BLOOD RIGHT HAND  Final   Special Requests IN PEDIATRIC BOTTLE 3CC  Final   Culture NO GROWTH 3 DAYS  Final   Report Status PENDING  Incomplete     Radiology Studies: No results found.  Scheduled Meds: . aspirin  81 mg Oral Daily  . chlorhexidine  15 mL Mouth Rinse BID  . enoxaparin (LOVENOX) injection  40 mg Subcutaneous Q24H  . escitalopram  10 mg Per Tube Daily  . feeding supplement (PRO-STAT SUGAR FREE 64)  30 mL Per Tube TID WC  . folic acid-pyridoxine-cyancobalamin  1 tablet Per Tube Daily  . fosfomycin  3 g Oral Q72H  . free water  200 mL Per Tube Q8H  . mouth rinse  15 mL Mouth Rinse q12n4p  . meropenem (MERREM) IV  1 g Intravenous Q12H  . metoprolol tartrate  25 mg Per Tube BID  . multivitamin  1 tablet Oral Daily  . pantoprazole sodium  40 mg Per Tube Daily  . polyethylene glycol  17 g Per Tube Daily  . pravastatin  40 mg Per Tube Daily  . Valproate Sodium  200 mg Per Tube Q8H  . vancomycin  750 mg Intravenous Q24H  . ascorbic acid  500 mg Oral Daily  . zinc sulfate  220 mg Oral Daily   Continuous Infusions: . feeding supplement (OSMOLITE 1.5 CAL) 1,000 mL (08/18/16 0526)     LOS: 10 days   Time spent: 25 minutes.  Vance Gather, MD [facility] Pager  [phone]  If 7PM-7AM, please contact night-coverage [email or web address] Password [facility] 08/18/2016, 12:23 PM

## 2016-08-19 ENCOUNTER — Inpatient Hospital Stay (HOSPITAL_COMMUNITY): Payer: Medicaid Other

## 2016-08-19 LAB — GLUCOSE, CAPILLARY
Glucose-Capillary: 101 mg/dL — ABNORMAL HIGH (ref 65–99)
Glucose-Capillary: 141 mg/dL — ABNORMAL HIGH (ref 65–99)

## 2016-08-19 LAB — CBC
HEMATOCRIT: 29.3 % — AB (ref 36.0–46.0)
Hemoglobin: 8.8 g/dL — ABNORMAL LOW (ref 12.0–15.0)
MCH: 29.1 pg (ref 26.0–34.0)
MCHC: 30 g/dL (ref 30.0–36.0)
MCV: 97 fL (ref 78.0–100.0)
Platelets: 496 10*3/uL — ABNORMAL HIGH (ref 150–400)
RBC: 3.02 MIL/uL — ABNORMAL LOW (ref 3.87–5.11)
RDW: 19.9 % — AB (ref 11.5–15.5)
WBC: 9.7 10*3/uL (ref 4.0–10.5)

## 2016-08-19 LAB — BASIC METABOLIC PANEL
Anion gap: 8 (ref 5–15)
BUN: 18 mg/dL (ref 6–20)
CALCIUM: 8.6 mg/dL — AB (ref 8.9–10.3)
CO2: 24 mmol/L (ref 22–32)
Chloride: 103 mmol/L (ref 101–111)
GLUCOSE: 97 mg/dL (ref 65–99)
Potassium: 4.7 mmol/L (ref 3.5–5.1)
Sodium: 135 mmol/L (ref 135–145)

## 2016-08-19 LAB — CULTURE, BLOOD (ROUTINE X 2)
CULTURE: NO GROWTH
Culture: NO GROWTH

## 2016-08-19 LAB — PROTIME-INR
INR: 1.18
Prothrombin Time: 15.1 seconds (ref 11.4–15.2)

## 2016-08-19 MED ORDER — ENOXAPARIN SODIUM 40 MG/0.4ML ~~LOC~~ SOLN
40.0000 mg | SUBCUTANEOUS | Status: DC
  Administered 2016-08-20: 40 mg via SUBCUTANEOUS
  Filled 2016-08-19 (×2): qty 0.4

## 2016-08-19 MED ORDER — LIDOCAINE HCL 1 % IJ SOLN
INTRAMUSCULAR | Status: AC
  Filled 2016-08-19: qty 20

## 2016-08-19 MED ORDER — FENTANYL CITRATE (PF) 100 MCG/2ML IJ SOLN
INTRAMUSCULAR | Status: AC
  Filled 2016-08-19: qty 2

## 2016-08-19 MED ORDER — MIDAZOLAM HCL 2 MG/2ML IJ SOLN
INTRAMUSCULAR | Status: AC
  Filled 2016-08-19: qty 2

## 2016-08-19 MED ORDER — MIDAZOLAM HCL 2 MG/2ML IJ SOLN
INTRAMUSCULAR | Status: AC | PRN
  Administered 2016-08-19: 1 mg via INTRAVENOUS

## 2016-08-19 MED ORDER — FENTANYL CITRATE (PF) 100 MCG/2ML IJ SOLN
INTRAMUSCULAR | Status: AC | PRN
  Administered 2016-08-19: 25 ug via INTRAVENOUS

## 2016-08-19 NOTE — Sedation Documentation (Signed)
Patient is resting comfortably. 

## 2016-08-19 NOTE — Consult Note (Signed)
Chief Complaint: Patient was seen in consultation today for right hip musculature abscess aspiration Chief Complaint  Patient presents with  . Altered Mental Status   at the request of Dr Allie Bossier  Referring Physician(s): Dr Aubery Lapping  Supervising Physician: Oley Balm  Patient Status: [facility] - [facility]-pt  History of Present Illness: Katherine Palmer is a 55 y.o. 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 Diffuse soft tissue phlegmon at the right hip. Remodeling of the anterior right femur and acetabulum, and at the posterior sacrum and coccyx, compatible with chronic osteomyelitis, due to underlying large sacral decubitus ulcerations. Mild phlegmon at the left hip. Vague fluid tracks along the musculature of the proximal right thigh, without focal abscess. Presacral stranding noted.  MRI Rt hip 12/1: IMPRESSION: 1. Chronic destruction and remodeling of the lower sacrum and coccyx consistent with chronic osteomyelitis. There is marrow edema and enhancement posteriorly in both iliac bones, suspicious for osteomyelitis. 2. Extensive heterotopic ossification and synovitis surrounding the right hip with large peripherally enhancing fluid collection in the right hip adductor musculature, worrisome for soft tissue abscess. No specific evidence of active osteomyelitis at the right hip. 3. Multifocal decubitus ulceration in the pelvis with associated generalized soft tissue inflammatory changes.  Request now for right hip musculature abscess aspiration per [facility] reviewed with Dr Jodell Cipro procedure  Scheduled now for same   Past Medical History:  Diagnosis Date  . Decubitus ulcer of sacral region, stage 4 (HCC)   . Depression   . Dysphagia   . Dysphagia as late effect  of cerebrovascular disease   . Hyperlipidemia   . Hypertension   . Seizures (HCC)   . Stroke [facility])     Past Surgical History:  Procedure Laterality Date  . 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 COLOSTOMY    . PEG TUBE PLACEMENT      Allergies: Patient has no known allergies.  Medications: Prior to Admission medications   Medication Sig Start Date End Date Taking? Authorizing Provider  acetaminophen (TYLENOL) 325 MG tablet Place 650 mg into feeding tube every 4 (four) hours as needed for mild pain.    Yes Historical Provider, MD  albuterol (PROVENTIL) (2.5 MG/3ML) 0.083% nebulizer solution Take 2.5 mg by nebulization every 4 (four) hours as needed for wheezing or shortness of breath.   Yes Historical Provider, MD  Amino Acids-Protein Hydrolys (FEEDING SUPPLEMENT, PRO-STAT SUGAR FREE 64,) LIQD Place 30 mLs into feeding tube 3 (three) times daily with meals.   Yes Historical Provider, MD  ascorbic acid (VITAMIN C) 500 MG tablet Take 500 mg by mouth daily.   Yes Historical Provider, MD  aspirin EC 81 MG tablet Take 81 mg by mouth daily. Via G-Tube    Yes Historical Provider, MD  bisacodyl (DULCOLAX) 10 MG suppository Place 10 mg rectally daily as needed for moderate constipation.   Yes Historical Provider, MD  cloNIDine (CATAPRES) 0.1 MG tablet Place 0.1 mg into feeding tube every 6 (six) hours as needed (blood pressure).    Yes Historical Provider, MD  collagenase (SANTYL) ointment Apply 1 application topically daily.   Yes Historical Provider, MD  escitalopram (LEXAPRO) 5 MG tablet Place 10 mg into feeding tube daily.   Yes Historical Provider, MD  ipratropium-albuterol (DUONEB) 0.5-2.5 (3)  MG/3ML SOLN Take 3 mLs by nebulization every 6 (six) hours as needed (sob).    Yes Historical Provider, MD  LORazepam (ATIVAN) 0.5 MG tablet Place 1 tablet (0.5 mg total) into feeding tube every 8 (eight) hours as needed for anxiety. 07/22/16  Yes Meredeth Ide, MD  magnesium hydroxide (MILK OF  MAGNESIA) 400 MG/5ML suspension Place 30 mLs into feeding tube daily as needed for mild constipation.    Yes Historical Provider, MD  metoprolol tartrate (LOPRESSOR) 25 MG tablet Take 1 tablet (25 mg total) by mouth 2 (two) times daily. Patient taking differently: Place 25 mg into feeding tube 2 (two) times daily.  07/22/16  Yes Meredeth Ide, MD  Multiple Vitamins-Minerals (DECUBI-VITE) CAPS Take 1 capsule by mouth daily.   Yes Historical Provider, MD  Nutritional Supplements (FEEDING SUPPLEMENT, OSMOLITE 1.5 CAL,) LIQD Place 1,000 mLs into feeding tube continuous. 05/21/16  Yes Jeralyn Bennett, MD  OxyCODONE HCl, Abuse Deter, (OXAYDO) 5 MG TABA Take 5 mg by mouth every 6 (six) hours as needed (pain). 07/22/16  Yes Meredeth Ide, MD  pantoprazole sodium (PROTONIX) 40 mg/20 mL PACK Place 40 mg into feeding tube daily.   Yes Historical Provider, MD  polyethylene glycol (MIRALAX / GLYCOLAX) packet Place 17 g into feeding tube daily.   Yes Historical Provider, MD  pravastatin (PRAVACHOL) 40 MG tablet Place 40 mg into feeding tube daily.   Yes Historical Provider, MD  Prenatal Ca Carb-B6-B12-FA (FOLBECAL) 1 MG TABS Give 1 tablet by tube daily.   Yes Historical Provider, MD  UNABLE TO FIND Med Name:House Supplement with meals for caloric support and weight loss nutritional treat 4 oz three times daily at meals   Yes Historical Provider, MD  Valproate Sodium (DEPAKENE PO) Give 200 mg by tube every 8 (eight) hours.   Yes Historical Provider, MD  Zinc 100 MG TABS Take 200 mg by mouth daily.   Yes Historical Provider, MD     Family History  Problem Relation Age of Onset  . Hypertension Other     Social History   Social History  . Marital status: Divorced    Spouse name: N/A  . Number of children: N/A  . Years of education: N/A   Social History Main Topics  . Smoking status: Former Games developer  . Smokeless tobacco: Never Used  . Alcohol use No  . Drug use: No  . Sexual activity: No   Other Topics  Concern  . None   Social History Narrative  . None    Review of Systems: A 12 point ROS discussed and pertinent positives are indicated in the HPI above.  All other systems are negative.  Review of Systems  Constitutional: Positive for fatigue. Negative for activity change, appetite change and fever.  Respiratory: Negative for shortness of breath.   Gastrointestinal: Negative for abdominal pain.  Musculoskeletal: Positive for back pain and gait problem.  Neurological: Positive for weakness.  Psychiatric/Behavioral: Positive for confusion. Negative for behavioral problems.    Vital Signs: BP (!) 156/88 (BP Location: Right Arm)   Pulse 90   Temp 97.7 F (36.5 C) (Oral)   Resp 17   Wt 104 lb 15 oz (47.6 kg)   SpO2 98%   BMI 18.59 kg/m   Physical Exam  Cardiovascular: Normal rate and regular rhythm.   Pulmonary/Chest: Effort normal and breath sounds normal.  Abdominal: Soft. Bowel sounds are normal.  Musculoskeletal:  Rt lower extr contracture Left LE paresis   Neurological: She  is alert.  Skin: Skin is warm and dry.  Psychiatric:  Consented with dtr Katicia via phone  Nursing note and vitals reviewed.   Mallampati Score:  MD Evaluation Airway: WNL Heart: WNL Abdomen: WNL Chest/ Lungs: WNL ASA  Classification: 3 Mallampati/Airway Score: Two  Imaging: Mr Hip Right W Wo Contrast  Result Date: 08/16/2016 CLINICAL DATA:  55 year old with fever, right sacral decubitus ulcer and extensive chronic inflammatory changes around the right hip on CT. Previous left above the knee amputation. EXAM: MRI OF THE RIGHT HIP WITHOUT AND WITH CONTRAST MR SACRUM AND SACROILIAC JOINTS WITHOUT AND WITH CONTRAST TECHNIQUE: Multiplanar, multisequence MR imaging was performed both before and after administration of intravenous contrast. CONTRAST:  10 ml MultiHance. COMPARISON:  CT 08/15/2016. FINDINGS: Bones/Joint/Cartilage : As correlated with CT, there is diffuse destruction, remodeling  and sclerosis of the lower sacrum and coccyx, most consistent with chronic osteomyelitis. The sacroiliac joints and symphysis pubis are intact. Both hips are located. There is extensive heterotopic ossification surrounding the right hip joint. There is an associated complex right hip joint effusion with a large peripherally enhancing fluid collection within the right adductor musculature. This fluid collection is best seen on the postcontrast images (series 37 through 42). It measures up to 3.9 x 12.7 x 3.2 cm. No other focal fluid collections are seen. There is some marrow edema and enhancement posteriorly in both iliac bones. No evidence of proximal femoral osteomyelitis. Ligaments Not relevant for exam/indication. Muscles and Tendons There is asymmetric T2 hyperintensity and enhancement throughout the right pelvic and proximal thigh musculature. As above, there is a large fluid collection within the right hip adductor musculature. The iliopsoas and hamstring tendons appear grossly intact. Soft tissues There is decubitus soft tissue ulceration posteriorly over the sacrum and right hip joint. There is diffuse edema and enhancement throughout the adjacent soft tissues, but no other focal fluid collections. Foley catheter and a small amount of free pelvic fluid noted. IMPRESSION: 1. Chronic destruction and remodeling of the lower sacrum and coccyx consistent with chronic osteomyelitis. There is marrow edema and enhancement posteriorly in both iliac bones, suspicious for osteomyelitis. 2. Extensive heterotopic ossification and synovitis surrounding the right hip with large peripherally enhancing fluid collection in the right hip adductor musculature, worrisome for soft tissue abscess. No specific evidence of active osteomyelitis at the right hip. 3. Multifocal decubitus ulceration in the pelvis with associated generalized soft tissue inflammatory changes. Electronically Signed   By: Carey Bullocks M.D.   On:  08/16/2016 11:31   Ct Abdomen Pelvis W Contrast  Result Date: 08/15/2016 CLINICAL DATA:  Acute onset of fever of unknown origin. Initial encounter. EXAM: CT ABDOMEN AND PELVIS WITH CONTRAST TECHNIQUE: Multidetector CT imaging of the abdomen and pelvis was performed using the standard protocol following bolus administration of intravenous contrast. CONTRAST:  100 mL ISOVUE-300 IOPAMIDOL (ISOVUE-300) INJECTION 61% COMPARISON:  CT of the abdomen and pelvis performed 03/10/2016 FINDINGS: Lower chest: Trace bilateral pleural effusions are noted. There is partial consolidation of the left lower lobe. The heart is enlarged. Scattered coronary artery calcification is noted. Hepatobiliary: A 6 mm hypodensity is noted within the left hepatic lobe. A calcified granuloma is noted within the liver. Small stones are seen dependently within the gallbladder. There is distention of the common bile duct to 8-9 mm in diameter. Pancreas: The pancreatic duct is dilated to 5 mm in diameter, raising concern for distal obstruction. Spleen: The spleen is unremarkable in appearance. Adrenals/Urinary Tract: The adrenal glands are  unremarkable in appearance. Small bilateral renal cysts are noted. Nonspecific left-sided perinephric stranding is seen. There is no evidence of hydronephrosis. No renal or ureteral stones are identified, though evaluation for renal stones is limited given contrast in the renal calyces. Stomach/Bowel: The stomach is decompressed. A G-tube is noted ending at the body of the stomach. The small bowel is decompressed and grossly unremarkable. The appendix is normal in caliber, without evidence of appendicitis. Scattered diverticulosis is noted along the cecum and ascending colon, with a right upper quadrant colostomy noted. The more distal colon is decompressed, with scattered diverticulosis along the transverse, descending and proximal sigmoid colon. There is no evidence of diverticulitis. Vascular/Lymphatic:  Scattered calcification is seen along the abdominal aorta and its branches, including at the proximal renal arteries bilaterally. The abdominal aorta is otherwise grossly unremarkable. The inferior vena cava is grossly unremarkable. No retroperitoneal lymphadenopathy is seen. No pelvic sidewall lymphadenopathy is identified. Reproductive: The bladder is decompressed, with a Foley catheter in place, and a small amount of contrast in the bladder. The uterus is grossly unremarkable. The ovaries are relatively symmetric. No suspicious adnexal masses are seen. Other: Diffuse soft-tissue phlegmon is noted at the right hip, with remodeling of the anterior right femur and acetabulum, and of the posterior sacrum and coccyx. Findings are compatible with chronic osteomyelitis and large sacral decubitus ulcerations. Mild phlegmon is noted at the left hip. Vague fluid is seen tracking along the musculature of the proximal right thigh. Presacral stranding is noted. Musculoskeletal: No acute osseous abnormalities are identified. The patient is status post thoracic spinal fusion. The visualized musculature is unremarkable in appearance. IMPRESSION: 1. Diffuse soft tissue phlegmon at the right hip. Remodeling of the anterior right femur and acetabulum, and at the posterior sacrum and coccyx, compatible with chronic osteomyelitis, due to underlying large sacral decubitus ulcerations. Mild phlegmon at the left hip. Vague fluid tracks along the musculature of the proximal right thigh, without focal abscess. Presacral stranding noted. 2. Trace bilateral pleural effusions. Partial consolidation of the left lower lobe, which could reflect pneumonia. 3. Dilatation of the common bile duct to 8-9 mm in diameter, and distention of the pancreatic duct to 5 mm, raising concern for distal obstruction. MRCP or ERCP could be considered for further evaluation, as deemed clinically appropriate. 4. Cholelithiasis. 5. Cardiomegaly.  Scattered coronary  artery calcifications noted. 6. 6 mm nonspecific hypodensity within the left hepatic lobe. 7. Scattered diverticulosis along the cecum, and ascending, transverse, descending and proximal sigmoid colon. Right upper quadrant colostomy is unremarkable in appearance. No evidence of diverticulitis. 8. Scattered aortic atherosclerosis. Calcification at the proximal renal arteries bilaterally. Electronically Signed   By: Roanna RaiderJeffery  Chang M.D.   On: 08/15/2016 02:12   Mr Sacrum Si Joints W Wo Contrast  Result Date: 08/16/2016 CLINICAL DATA:  55 year old with fever, right sacral decubitus ulcer and extensive chronic inflammatory changes around the right hip on CT. Previous left above the knee amputation. EXAM: MRI OF THE RIGHT HIP WITHOUT AND WITH CONTRAST MR SACRUM AND SACROILIAC JOINTS WITHOUT AND WITH CONTRAST TECHNIQUE: Multiplanar, multisequence MR imaging was performed both before and after administration of intravenous contrast. CONTRAST:  10 ml MultiHance. COMPARISON:  CT 08/15/2016. FINDINGS: Bones/Joint/Cartilage : As correlated with CT, there is diffuse destruction, remodeling and sclerosis of the lower sacrum and coccyx, most consistent with chronic osteomyelitis. The sacroiliac joints and symphysis pubis are intact. Both hips are located. There is extensive heterotopic ossification surrounding the right hip joint. There is an associated  complex right hip joint effusion with a large peripherally enhancing fluid collection within the right adductor musculature. This fluid collection is best seen on the postcontrast images (series 37 through 42). It measures up to 3.9 x 12.7 x 3.2 cm. No other focal fluid collections are seen. There is some marrow edema and enhancement posteriorly in both iliac bones. No evidence of proximal femoral osteomyelitis. Ligaments Not relevant for exam/indication. Muscles and Tendons There is asymmetric T2 hyperintensity and enhancement throughout the right pelvic and proximal thigh  musculature. As above, there is a large fluid collection within the right hip adductor musculature. The iliopsoas and hamstring tendons appear grossly intact. Soft tissues There is decubitus soft tissue ulceration posteriorly over the sacrum and right hip joint. There is diffuse edema and enhancement throughout the adjacent soft tissues, but no other focal fluid collections. Foley catheter and a small amount of free pelvic fluid noted. IMPRESSION: 1. Chronic destruction and remodeling of the lower sacrum and coccyx consistent with chronic osteomyelitis. There is marrow edema and enhancement posteriorly in both iliac bones, suspicious for osteomyelitis. 2. Extensive heterotopic ossification and synovitis surrounding the right hip with large peripherally enhancing fluid collection in the right hip adductor musculature, worrisome for soft tissue abscess. No specific evidence of active osteomyelitis at the right hip. 3. Multifocal decubitus ulceration in the pelvis with associated generalized soft tissue inflammatory changes. Electronically Signed   By: Carey BullocksWilliam  Veazey M.D.   On: 08/16/2016 11:31   Dg Chest Port 1 View  Result Date: 08/08/2016 CLINICAL DATA:  Sepsis. Altered mental status and lethargy tonight. History of strokes. EXAM: PORTABLE CHEST 1 VIEW COMPARISON:  05/16/2016 FINDINGS: Postoperative changes in the thoracic spine. Shallow inspiration. Cardiac enlargement. Mild pulmonary vascular congestion. Linear atelectasis in the mid lungs bilaterally. No edema or consolidation. Costophrenic angles are obscured and can't exclude small pleural effusions. No pneumothorax. Calcified aorta. Old right rib fractures. IMPRESSION: Cardiac enlargement with pulmonary vascular congestion. No edema or consolidation. Linear atelectasis in the mid lungs. Electronically Signed   By: Burman NievesWilliam  Stevens M.D.   On: 08/08/2016 00:52    Labs:  CBC:  Recent Labs  08/16/16 0415 08/17/16 0439 08/18/16 0458 08/19/16 0535    WBC 8.8 9.7 9.7 9.7  HGB 8.8* 8.9* 9.4* 8.8*  HCT 30.5* 30.0* 31.5* 29.3*  PLT 316 369 397 496*    COAGS:  Recent Labs  04/08/16 05/18/16 0800 08/19/16 0535  INR 1.3* 1.31 1.18    BMP:  Recent Labs  08/13/16 0533 08/14/16 0605 08/15/16 0524 08/19/16 0535  NA 145 144 140 135  K 4.2 4.1 4.1 4.7  CL 113* 112* 112* 103  CO2 24 27 22 24   GLUCOSE 120* 131* 135* 97  BUN 22* 19 21* 18  CALCIUM 8.1* 8.2* 8.3* 8.6*  CREATININE 0.34* 0.36* <0.30* <0.30*  GFRNONAA >60 >60 NOT CALCULATED NOT CALCULATED  GFRAA >60 >60 NOT CALCULATED NOT CALCULATED    LIVER FUNCTION TESTS:  Recent Labs  08/08/16 0044 08/08/16 0626 08/13/16 0533 08/14/16 0605  BILITOT 0.4 0.3 0.2* 0.2*  AST 24 19 31 29   ALT 15 13* 19 22  ALKPHOS 86 78 65 63  PROT 6.9 6.3* 5.9* 6.3*  ALBUMIN 1.5* 1.4* 1.3* 1.4*    TUMOR MARKERS: No results for input(s): AFPTM, CEA, CA199, CHROMGRNA in the last 8760 hours.  Assessment and Plan:  Pt from SNF; long term care Chronic Rt decub ulcer +osteopmyelitis Fluid collection./abscess identified at Rt Hip musculature Now scheduled for aspiration of  same Risks and Benefits discussed with the patient  And daughter via phone including, but not limited to bleeding, infection, damage to adjacent structures or low yield requiring additional tests. All of the patient's and dtr questions were answered, they are agreeable to proceed. Consent signed and in chart.  Thank you for this interesting consult.  I greatly enjoyed meeting Kaoru Benda and look forward to participating in their care.  A copy of this report was sent to the requesting provider on this date.  Electronically Signed: Kleo Dungee A 08/19/2016, 9:26 AM   I spent a total of 40 Minutes    in face to face in clinical consultation, greater than 50% of which was counseling/coordinating care for right hip musculature abscess aspiration

## 2016-08-19 NOTE — Sedation Documentation (Signed)
Pt tolerated procedure very well. 

## 2016-08-19 NOTE — Progress Notes (Signed)
Pt tube feedings had been stopped for procedure today.

## 2016-08-19 NOTE — Progress Notes (Signed)
PROGRESS NOTE  Katherine Palmer  ZSW:[id number] DOB: 01-05-1961 DOA: 08/08/2016 PCP: Gildardo Cranker, DO   Brief Narrative: Patient is a 55 y.o.female with history of CVA with left hemiparesis, with left AKA, chronic right lower extremity contracture, stage IV decub ulcer with colostomy and PEG tube for feeding due to dysphagia, seizure disorder and HTNwas brought to the ER from the skilled nursing facility after patient was found to have altered mental status. She was thought to be encephalopathic from a UTI. She was subsequently admitted for further evaluation and treatment. IV antibiotics were tailored to urine culture results showing ESBL E.coli and pseudomonas, but fevers continued. CT abdomen and pelvis showed evidence of pyelonephritis and MRI of hips and sacrum due to chronic decubitus ulcers showed evidence of osteomyelitis and soft tissue abscess around the right hip. Orthopedic surgery was consulted and recommended IR drainage which has been ordered. She continues to have relapsing fevers despite broad-spectrum antibiotics.  Assessment & Plan: Principal Problem:   Acute encephalopathy Active Problems:   Hemiparesis affecting left side as late effect of cerebrovascular accident (CVA) ([place])   Essential hypertension, benign   Dysphagia due to old cerebrovascular accident   S/P AKA (above knee amputation) ([place])   Stage IV decubitus ulcer ([facility])   Urinary tract infection associated with indwelling urethral catheter ([facility])   Altered mental status   Other chronic osteomyelitis, other site ([facility])  Sepsis from ESBL E Coli and Pseudomonas pyelonephritis: Sepsis improving but not resolving, still febrile but hemodynamically stable. CT showed left perinephric stranding as well as concern for LLL consolidation though suspicion for PNA remains low and abx would be covering HCAP pathogens regardless. UCx with >100,000 CFU ESBL Escherichia coli and >50,000 CFU of Pseudomonas Aeruginosa. Suspect zosyn  (with MIC 32) was inadequately treating infections.  - ID consulted, restarted meropenem/vancomycin. - Blood culltures 11/23 no growth and 11/29 NGTD  Deep fluid collection in right hip/adductor: With recurrent fevers despite IV antibiotics, suspect abscess is etiology.  - Orthopedic surgery, Dr. Ninfa Linden recommended IR drainage  - IR drainage today, restart tube feeds after this.  Chronic osteomyelitis due to stage IV decubitus ulcers: Present PTA. See pictures taken on 11/24 by Dr. Sloan Leiter. - Appreciate WOC assistance: wounds unchanged since admission with exposed bone - MRI shows chronic osteomyelitis of sacrum and coccyx with concern for acute osteomyelitis in iliac bones and enhancing fluid collection in right hipp adductor musculature, possibly deep tissue abscess.  - No need for debridement per general surgery. - ESR 94, CRP 1.3.   Biliary and pancreatic duct dilitation: With normal lipase and LFTs, doubt this is of clinical significance.   Acute metabolic encephalopathy: EEG not performed due to hair matting.  - Improving.Suspect likely due to sepsis on abnormal baseline.  Hypertension:  -Continue with Metoprolol 25 mg po per PEG BID -C/w Hydralazine 10 mg IV q4hprn for SBP > 160   Hyperkalemia:Resolved - Repeat BMP in AM  History of CVA with residual left-sided Hemiplegia, Dysarthria and Dysphagia:  -PEG tube in place -Continue Aspirin 81 mg and Statin.   Seizure Disorder:  -Continue Valproate Sodium 200 mg Per PEG q8h: level was < 10 on admission (goal 50-100)  Depression -C/w Escitalopram 10 mg Per PEG Daily  S/P Left AKA (above knee amputation)  DVT prophylaxis: Lovenox Code Status: Full Family Communication: None at bedside this AM; discussed with daughter on the phone. Disposition Plan: SNF once sepsis resolved, requiring IV antibiotics, IR procedure 12/4.   Consultants:   Infectious disease,  Dr. Johnnye Sima by phone 11/27 and Dr. Baxter Flattery by phone  11/28.  ID, Dr. Baxter Flattery formally consulted 11/29.  Orthopedic surgery, Dr. Ninfa Linden  General surgery 12/1, Dr. Rosendo Gros  Interventional radiology, Dr. Annamaria Boots  Procedures:   Aspiration of deep tissue abscess 12/4  Antimicrobials:  Ceftriaxone 11/23  Ceftazidime 11/23 > 11/26  Meropenem 11/26 > 11/28, restarted 11/29  Zosyn 11/27 > 11/28  Fosfomycin 11/28 >> 11/29  Subjective: Pt remains confused, pain reported as controlled. Intermittently febrile. Denies chest pain, cough, wheezing, dyspnea.   Objective: Vitals:   08/18/16 1059 08/18/16 1422 08/18/16 2137 08/19/16 0552  BP: 140/77 135/84 (!) 145/91 (!) 156/88  Pulse: 97 (!) 107 99 90  Resp:  _0 Temp:  99.5 F (37.5 C) 98.3 F (36.8 C) 97.7 F (36.5 C)  TempSrc:  Oral Oral Oral  SpO2:  100% 100% 98%  Weight:        Intake/Output Summary (Last 24 hours) at 08/19/16 1309 Last data filed at 08/19/16 1000  Gross per 24 hour  Intake              916 ml  Output             1450 ml  Net             -534 ml   Filed Weights   08/13/16 1005  Weight: 47.6 kg (104 lb 15 oz)    Examination: General exam: 55 y.o. female in no distress appearing older than stated age. Respiratory system: Non-labored breathing room air. Diminished but clear to auscultation bilaterally.  Cardiovascular system: Regular rate and normal rhythm. No murmur, rub, or gallop. No JVD, and no edema. Gastrointestinal system: Abdomen soft, non-tender, non-distended, with normoactive bowel sounds. No organomegaly or masses felt. Colostomy site on right abdomen nontender with clear brown contents. PEG in left abdomen nontender without discharge.  Central nervous system: Awake, not oriented, doesn't believe she's in the hospital. No focal neurological deficits. Extremities: Left high AKA, right leg contracted with tenderness to palpation along proximal musculature without focal abscess palpated. Skin: Decubitus ulcer with c/d/i dressing pad not  removed today. Last evaluated by me 12/1.  Psychiatry: UTD  Data Reviewed: I have personally reviewed following labs and imaging studies  CBC:  Recent Labs Lab 08/13/16 0533  08/15/16 0524 08/16/16 0415 08/17/16 0439 08/18/16 0458 08/19/16 0535  WBC 9.9  < > 10.0 8.8 9.7 9.7 9.7  NEUTROABS 5.2  --   --   --   --   --   --   HGB 9.3*  < > 9.8* 8.8* 8.9* 9.4* 8.8*  HCT 32.2*  < > 34.1* 30.5* 30.0* 31.5* 29.3*  MCV 100.6*  < > 100.9* 100.3* 98.0 98.1 97.0  PLT 244  < > 331 316 369 397 496*  < > = values in this interval not displayed. Basic Metabolic Panel:  Recent Labs Lab 08/13/16 0533 08/14/16 0605 08/15/16 0524 08/19/16 0535  NA 145 144 140 135  K 4.2 4.1 4.1 4.7  CL 113* 112* 112* 103  CO2 _1 GLUCOSE 120* 131* 135* 97  BUN 22* 19 21* 18  CREATININE 0.34* 0.36* <0.30* <0.30*  CALCIUM 8.1* 8.2* 8.3* 8.6*  MG 1.8  --   --   --   PHOS 2.3*  --   --   --    GFR: CrCl cannot be calculated (This lab value cannot be used to calculate CrCl because it is  not a number: <0.30). Liver Function Tests:  Recent Labs Lab 08/13/16 0533 08/14/16 0605  AST 31 29  ALT 19 22  ALKPHOS 65 63  BILITOT 0.2* 0.2*  PROT 5.9* 6.3*  ALBUMIN 1.3* 1.4*    Recent Labs Lab 08/15/16 0836  LIPASE 23   No results for input(s): AMMONIA in the last 168 hours. CBG:  Recent Labs Lab 08/18/16 0018 08/18/16 0800 08/18/16 1621 08/19/16 0021 08/19/16 0758  GLUCAP 120* 117* 109* 141* 101*   Lipid Profile: No results for input(s): CHOL, HDL, LDLCALC, TRIG, CHOLHDL, LDLDIRECT in the last 72 hours. Thyroid Function Tests: No results for input(s): TSH, T4TOTAL, FREET4, T3FREE, THYROIDAB in the last 72 hours. Anemia Panel: No results for input(s): VITAMINB12, FOLATE, FERRITIN, TIBC, IRON, RETICCTPCT in the last 72 hours. Urine analysis:    Component Value Date/Time   COLORURINE AMBER (A) 08/08/2016 0115   APPEARANCEUR CLOUDY (A) 08/08/2016 0115   LABSPEC 1.024 08/08/2016  0115   PHURINE 5.5 08/08/2016 0115   GLUCOSEU NEGATIVE 08/08/2016 0115   HGBUR MODERATE (A) 08/08/2016 0115   BILIRUBINUR NEGATIVE 08/08/2016 0115   KETONESUR NEGATIVE 08/08/2016 0115   PROTEINUR 30 (A) 08/08/2016 0115   NITRITE POSITIVE (A) 08/08/2016 0115   LEUKOCYTESUR MODERATE (A) 08/08/2016 0115   Sepsis Labs: _0 (procalcitonin:4,lacticidven:4)  ) Recent Results (from the past 240 hour(s))  Culture, blood (routine x 2)     Status: None (Preliminary result)   Collection Time: 08/14/16  8:02 PM  Result Value Ref Range Status   Specimen Description BLOOD RIGHT HAND  Final   Special Requests IN PEDIATRIC BOTTLE 3CC  Final   Culture NO GROWTH 4 DAYS  Final   Report Status PENDING  Incomplete  Culture, blood (routine x 2)     Status: None (Preliminary result)   Collection Time: 08/14/16  8:02 PM  Result Value Ref Range Status   Specimen Description BLOOD RIGHT HAND  Final   Special Requests IN PEDIATRIC BOTTLE 3CC  Final   Culture NO GROWTH 4 DAYS  Final   Report Status PENDING  Incomplete     Radiology Studies: No results found.  Scheduled Meds: . aspirin  81 mg Oral Daily  . chlorhexidine  15 mL Mouth Rinse BID  . [START ON 08/20/2016] enoxaparin (LOVENOX) injection  40 mg Subcutaneous Q24H  . escitalopram  10 mg Per Tube Daily  . feeding supplement (PRO-STAT SUGAR FREE 64)  30 mL Per Tube TID WC  . folic acid-pyridoxine-cyancobalamin  1 tablet Per Tube Daily  . fosfomycin  3 g Oral Q72H  . free water  200 mL Per Tube Q8H  . mouth rinse  15 mL Mouth Rinse q12n4p  . meropenem (MERREM) IV  1 g Intravenous Q12H  . metoprolol tartrate  25 mg Per Tube BID  . multivitamin  1 tablet Oral Daily  . pantoprazole sodium  40 mg Per Tube Daily  . polyethylene glycol  17 g Per Tube Daily  . pravastatin  40 mg Per Tube Daily  . Valproate Sodium  200 mg Per Tube Q8H  . vancomycin  750 mg Intravenous Q24H  . ascorbic acid  500 mg Oral Daily  . zinc sulfate  220 mg Oral  Daily   Continuous Infusions: . feeding supplement (OSMOLITE 1.5 CAL) 1,000 mL (08/18/16 0526)     LOS: 11 days   Time spent: 25 minutes.  Vance Gather, MD [facility] Pager [phone]  If 7PM-7AM, please contact night-coverage [email or web address] Password [facility] 08/19/2016,  1:09 PM

## 2016-08-19 NOTE — Procedures (Signed)
CT guided 18g aspiration R thigh process -- returning only scant old blood, sent for GS, C&S No complication No blood loss. [facility].

## 2016-08-20 LAB — CBC
HEMATOCRIT: 28.1 % — AB (ref 36.0–46.0)
HEMOGLOBIN: 8.6 g/dL — AB (ref 12.0–15.0)
MCH: 29.2 pg (ref 26.0–34.0)
MCHC: 30.6 g/dL (ref 30.0–36.0)
MCV: 95.3 fL (ref 78.0–100.0)
Platelets: 553 10*3/uL — ABNORMAL HIGH (ref 150–400)
RBC: 2.95 MIL/uL — ABNORMAL LOW (ref 3.87–5.11)
RDW: 19.9 % — ABNORMAL HIGH (ref 11.5–15.5)
WBC: 9.2 10*3/uL (ref 4.0–10.5)

## 2016-08-20 LAB — GLUCOSE, CAPILLARY
GLUCOSE-CAPILLARY: 141 mg/dL — AB (ref 65–99)
GLUCOSE-CAPILLARY: 132 mg/dL — AB (ref 65–99)
Glucose-Capillary: 125 mg/dL — ABNORMAL HIGH (ref 65–99)
Glucose-Capillary: 155 mg/dL — ABNORMAL HIGH (ref 65–99)

## 2016-08-20 LAB — VANCOMYCIN, TROUGH: Vancomycin Tr: 4 ug/mL — ABNORMAL LOW (ref 15–20)

## 2016-08-20 MED ORDER — VANCOMYCIN HCL IN DEXTROSE 750-5 MG/150ML-% IV SOLN
750.0000 mg | Freq: Two times a day (BID) | INTRAVENOUS | Status: DC
  Administered 2016-08-20 – 2016-08-22 (×4): 750 mg via INTRAVENOUS
  Filled 2016-08-20 (×5): qty 150

## 2016-08-20 NOTE — Progress Notes (Signed)
Called [facility] to find out date foley was placed at their facility. Foley was placed 07/23/16.

## 2016-08-20 NOTE — Progress Notes (Signed)
Nutrition Follow-up  DOCUMENTATION CODES:   Not applicable  INTERVENTION:   -Continue Osmolite 1.5 @ 60 ml/hr via PEG   30 ml Prostat TID   Tube feeding regimen provides 2460 kcals, 135 grams protein, and 1097 ml fluid daily (which meets >100% of estimated kcal and protein needs).    NUTRITION DIAGNOSIS:   Increased nutrient needs related to wound healing as evidenced by estimated needs.  Ongoing  GOAL:   Patient will meet greater than or equal to 90% of their needs  Met with TF  MONITOR:   Diet advancement, Labs, Weight trends, TF tolerance, Skin, I & O's  REASON FOR ASSESSMENT:   Malnutrition Screening Tool, Other (Comment) (new TF)    ASSESSMENT:   Katherine Palmer is a 55 y.o. female with history of CVA with left hemiparesis, with left AKA, stage IV decub ulcer with colostomy and PEG tube for feeding due to dysphagia, seizure disorder and HTN was brought to the ER from the skilled nursing facility after patient was found to be confused and encephalopathic. In the ER patient was found to have leukocytosis with fever. UA is compatible with UTI. On exam patient is only oriented to her name. No family at the bedside and baseline mental status not known.  Case discussed with RN, who reports fevers have improved. Pt continues to tolerate TF well.   12/1- wounds look good per surgical service, with no plans for further debridement 12/2- ortho evaluated ands recommended drain fluid of rt hip soft tissue abscess 12/4- s/p CT guided aspiration of rt thigh by IR  Osmolite 1.5 is currently infusing @ 60 ml/hr via PEG. Pt also receiving 30 ml Prostat TID. Complete regimen provides 2460 kcals, 135 grams protein, and 1097 ml fluid daily (which meets >100% of estimated kcal and protein needs).   Labs reviewed: CBGS: 125-141.   Diet Order:   NPO  Skin:  Wound (see comment) (st IV sacrum/buttock/rt trochanter, FT middle back)  Last BM:  08/20/16  Height:   Ht Readings from  Last 1 Encounters:  07/23/16 _0  (1.6 m)    Weight:   Wt Readings from Last 1 Encounters:  08/13/16 104 lb 15 oz (47.6 kg)    Ideal Body Weight:  48.1 kg  BMI:  Body mass index is 18.59 kg/m.  Estimated Nutritional Needs:   Kcal:  1700-1900  Protein:  100-120 grams  Fluid:  >1.7 L  EDUCATION NEEDS:   No education needs identified at this time  Clarice Bonaventure A. Jimmye Norman, RD, LDN, CDE Pager: [phone] After hours Pager: [phone]

## 2016-08-20 NOTE — Progress Notes (Signed)
PROGRESS NOTE  Katherine Palmer  YQM:[id number] DOB: 1961/03/07 DOA: 08/08/2016 PCP: Gildardo Cranker, DO   Brief Narrative: Patient is a 55 y.o.female with history of CVA with left hemiparesis, with left AKA, chronic right lower extremity contracture, stage IV decub ulcer with colostomy and PEG tube for feeding due to dysphagia, seizure disorder and HTNwas brought to the ER from the skilled nursing facility after patient was found to have altered mental status. She was thought to be encephalopathic from a UTI. She was subsequently admitted for further evaluation and treatment. IV antibiotics were tailored to urine culture results showing ESBL E.coli and pseudomonas, but fevers continued. CT abdomen and pelvis showed evidence of pyelonephritis and MRI of hips and sacrum due to chronic decubitus ulcers showed evidence of osteomyelitis and soft tissue abscess around the right hip. Orthopedic surgery was consulted and recommended IR drainage which has been ordered. She continues to have relapsing fevers despite broad-spectrum antibiotics.  Assessment & Plan: Principal Problem:   Acute encephalopathy Active Problems:   Hemiparesis affecting left side as late effect of cerebrovascular accident (CVA) ([place])   Essential hypertension, benign   Dysphagia due to old cerebrovascular accident   S/P AKA (above knee amputation) ([place])   Stage IV decubitus ulcer ([facility])   Urinary tract infection associated with indwelling urethral catheter ([facility])   Altered mental status   Other chronic osteomyelitis, other site ([facility])  Sepsis from ESBL E Coli and Pseudomonas pyelonephritis: Sepsis improving but not resolving, still febrile but hemodynamically stable. CT showed left perinephric stranding as well as concern for LLL consolidation though suspicion for PNA remains low and abx would be covering HCAP pathogens regardless. UCx with >100,000 CFU ESBL Escherichia coli and >50,000 CFU of Pseudomonas Aeruginosa. Suspect zosyn  (with MIC 32) was inadequately treating infections.  - ID consulted, restarted meropenem/vancomycin x6 weeks. Stop date Jan 5. Labs weekly while on IV antibiotics: _x_ CBC with differential _x_ BMP twice per week _x_ CRP _x_ ESR _x_ Vancomycin trough twice per week (target 15-20) _x_ Please pull PIC at completion of IV antibiotics - Fax weekly labs to [phone] - Follow up in [facility] 4-6 weeks - Blood culltures 11/23 no growth and 11/29 NGTD - Replace foley  Deep fluid collection in right hip/adductor: With recurrent fevers despite IV antibiotics, suspect abscess is etiology.  - Orthopedic surgery, Dr. Ninfa Linden recommended IR drainage  - IR drainage 12/4 - Follow up wound culture (NGTD)  Chronic osteomyelitis due to stage IV decubitus ulcers: Present PTA. Unchanged in appearance from pictures taken on 11/24 by Dr. Sloan Leiter. - Appreciate WOC assistance: wounds unchanged since admission with exposed bone - MRI shows chronic osteomyelitis of sacrum and coccyx with concern for acute osteomyelitis in iliac bones and enhancing fluid collection in right hipp adductor musculature, possibly deep tissue abscess.  - No need for debridement per general surgery. - ESR 94, CRP 1.3.   Biliary and pancreatic duct dilitation: With normal lipase and LFTs, doubt this is of clinical significance.   Acute metabolic encephalopathy: EEG not performed due to hair matting.  - Stable.Suspect likely due to sepsis on abnormal baseline.  Hypertension:  -Continue with Metoprolol 25 mg po per PEG BID -C/w Hydralazine 10 mg IV q4hprn for SBP > 160   Hyperkalemia:Resolved - Repeat BMP in AM  History of CVA with residual left-sided Hemiplegia, Dysarthria and Dysphagia:  -PEG tube in place -Continue Aspirin 81 mg and Statin.   Seizure Disorder:  -Continue Valproate Sodium 200 mg Per PEG q8h:  -  level was < 10 on admission (goal 50-100): ?wasn't getting this, suggest recheck on follow up  Chronic  anemia: Stable.  - Hgb stable postop. Will continue to monitor.   Depression -C/w Escitalopram 10 mg Per PEG Daily  S/P Left AKA (above knee amputation)  DVT prophylaxis: Lovenox Code Status: Full Family Communication: None at bedside this AM; discussed with daughter on the phone. Disposition Plan: SNF once sepsis resolved, requiring IV antibiotics  Consultants:   Infectious disease, Dr. Johnnye Sima by phone 11/27 and Dr. Baxter Flattery by phone 11/28.  ID, Dr. Baxter Flattery formally consulted 11/29.  Orthopedic surgery, Dr. Ninfa Linden  General surgery 12/1, Dr. Rosendo Gros  Interventional radiology, Dr. Annamaria Boots  Procedures:   Aspiration of deep tissue abscess 12/4  Antimicrobials:  Ceftriaxone 11/23  Ceftazidime 11/23 > 11/26  Meropenem 11/26 > 11/28, restarted 11/29  Zosyn 11/27 > 11/28  Fosfomycin 11/28 >> 11/29  Subjective: Pt remains confused, pain reported as controlled. Afebrile since procedure. Denies chest pain, cough, wheezing, dyspnea.   Objective: Vitals:   08/19/16 1652 08/19/16 2109 08/20/16 0605 08/20/16 0920  BP: (!) 151/87 130/74 126/74 140/75  Pulse: 95 92 92 92  Resp: 13 (!) 22 20   Temp:  99 F (37.2 C) 99.4 F (37.4 C)   TempSrc:  Oral Axillary   SpO2: 100% 99% 99%   Weight:        Intake/Output Summary (Last 24 hours) at 08/20/16 1234 Last data filed at 08/20/16 5189  Gross per 24 hour  Intake             1144 ml  Output              650 ml  Net              494 ml   Filed Weights   08/13/16 1005  Weight: 47.6 kg (104 lb 15 oz)    Examination: General exam: 55 y.o. female in no distress appearing older than stated age. Respiratory system: Non-labored breathing room air. Diminished but clear to auscultation bilaterally.  Cardiovascular system: Regular rate and normal rhythm. No murmur, rub, or gallop. No JVD, and no edema. Gastrointestinal system: Abdomen soft, non-tender, non-distended, with normoactive bowel sounds. No organomegaly or masses  felt. Colostomy site on right abdomen nontender with clear brown contents. PEG in left abdomen nontender without discharge.  Central nervous system: Awake, not oriented, doesn't believe she's in the hospital. No focal neurological deficits. Extremities: Left high AKA, right leg contracted with tenderness to palpation along proximal musculature posteriorly without focal abscess palpated, dressing c/d/i. Skin: Decubitus ulcer with c/d/i dressing pad. Wound bed healthy without discharge, unchanged.  Psychiatry: UTD  Data Reviewed: I have personally reviewed following labs and imaging studies  CBC:  Recent Labs Lab 08/16/16 0415 08/17/16 0439 08/18/16 0458 08/19/16 0535 08/20/16 0821  WBC 8.8 9.7 9.7 9.7 9.2  HGB 8.8* 8.9* 9.4* 8.8* 8.6*  HCT 30.5* 30.0* 31.5* 29.3* 28.1*  MCV 100.3* 98.0 98.1 97.0 95.3  PLT 316 369 397 496* 842*   Basic Metabolic Panel:  Recent Labs Lab 08/14/16 0605 08/15/16 0524 08/19/16 0535  NA 144 140 135  K 4.1 4.1 4.7  CL 112* 112* 103  CO2 _0 GLUCOSE 131* 135* 97  BUN 19 21* 18  CREATININE 0.36* <0.30* <0.30*  CALCIUM 8.2* 8.3* 8.6*   GFR: CrCl cannot be calculated (This lab value cannot be used to calculate CrCl because it is not a number: <0.30). Liver Function  Tests:  Recent Labs Lab 08/14/16 0605  AST 29  ALT 22  ALKPHOS 63  BILITOT 0.2*  PROT 6.3*  ALBUMIN 1.4*    Recent Labs Lab 08/15/16 0836  LIPASE 23   No results for input(s): AMMONIA in the last 168 hours. CBG:  Recent Labs Lab 08/18/16 1621 08/19/16 0021 08/19/16 0758 08/20/16 0006 08/20/16 0801  GLUCAP 109* 141* 101* 125* 141*   Lipid Profile: No results for input(s): CHOL, HDL, LDLCALC, TRIG, CHOLHDL, LDLDIRECT in the last 72 hours. Thyroid Function Tests: No results for input(s): TSH, T4TOTAL, FREET4, T3FREE, THYROIDAB in the last 72 hours. Anemia Panel: No results for input(s): VITAMINB12, FOLATE, FERRITIN, TIBC, IRON, RETICCTPCT in the last 72  hours. Urine analysis:    Component Value Date/Time   COLORURINE AMBER (A) 08/08/2016 0115   APPEARANCEUR CLOUDY (A) 08/08/2016 0115   LABSPEC 1.024 08/08/2016 0115   PHURINE 5.5 08/08/2016 0115   GLUCOSEU NEGATIVE 08/08/2016 0115   HGBUR MODERATE (A) 08/08/2016 0115   BILIRUBINUR NEGATIVE 08/08/2016 0115   KETONESUR NEGATIVE 08/08/2016 0115   PROTEINUR 30 (A) 08/08/2016 0115   NITRITE POSITIVE (A) 08/08/2016 0115   LEUKOCYTESUR MODERATE (A) 08/08/2016 0115   Sepsis Labs: _0 (procalcitonin:4,lacticidven:4)  ) Recent Results (from the past 240 hour(s))  Culture, blood (routine x 2)     Status: None   Collection Time: 08/14/16  8:02 PM  Result Value Ref Range Status   Specimen Description BLOOD RIGHT HAND  Final   Special Requests IN PEDIATRIC BOTTLE 3CC  Final   Culture NO GROWTH 5 DAYS  Final   Report Status 08/19/2016 FINAL  Final  Culture, blood (routine x 2)     Status: None   Collection Time: 08/14/16  8:02 PM  Result Value Ref Range Status   Specimen Description BLOOD RIGHT HAND  Final   Special Requests IN PEDIATRIC BOTTLE 3CC  Final   Culture NO GROWTH 5 DAYS  Final   Report Status 08/19/2016 FINAL  Final  Aerobic/Anaerobic Culture (surgical/deep wound)     Status: None (Preliminary result)   Collection Time: 08/19/16  9:41 PM  Result Value Ref Range Status   Specimen Description WOUND RIGHT THIGH  Final   Special Requests Normal  Final   Gram Stain   Final    FEW WBC PRESENT,BOTH PMN AND MONONUCLEAR NO ORGANISMS SEEN    Culture NO GROWTH < 12 HOURS  Final   Report Status PENDING  Incomplete     Radiology Studies: Ct Aspiration  Result Date: 08/20/2016 CLINICAL DATA:  55 year old with fever, right sacral decubitus ulcer and extensive chronic inflammatory changes around the right hip on CT. Previous left above the knee amputation.  Recent MR demonstrates Chronic destruction and remodeling of the lower sacrum and coccyxconsistent with chronic  osteomyelitis. There is marrow edema andenhancement posteriorly in both iliac bones, suspicious forosteomyelitis. Extensive heterotopic ossification and synovitis surrounding theright hip with large peripherally enhancing fluid collection in theright hip adductor musculature, worrisome for soft tissue abscess.No specific evidence of active osteomyelitis at the right hip. Multifocal decubitus ulceration in the pelvis with associatedgeneralized soft tissue inflammatory changes. EXAM: CT GUIDED ASPIRATION BIOPSY OF RIGHT THIGH ANESTHESIA/SEDATION: Intravenous Fentanyl and Versed were administered as conscious sedation during continuous monitoring of the patient's level of consciousness and physiological / cardiorespiratory status by the radiology RN, with a total moderate sedation time of 20 minutes. PROCEDURE: The procedure risks, benefits, and alternatives were explained to the patient. Questions regarding the procedure were encouraged and  answered. The patient understands and consents to the procedure. select axial scans through the right hip and upper thigh were obtained. The adductor musculature collection was localized based on recent MR imaging. An appropriate skin entry site was determined and marked. The operative field was prepped with chlorhexidinein a sterile fashion, and a sterile drape was applied covering the operative field. A sterile gown and sterile gloves were used for the procedure. Local anesthesia was provided with 1% Lidocaine. Under CT fluoroscopic guidance, an 18 gauge trocar needle was advanced into the collection. A scant amount of bloody fluid was aspirated, sent for Gram stain and culture. Postprocedure scans show no hemorrhage or other apparent complication. The patient tolerated the procedure well. COMPLICATIONS: None immediate FINDINGS: Poorly marginated low attenuation in the right adductor musculature corresponding to fluid signal process noted on reviewed previous MRI. CT-guided  aspiration returned only a scant amount of bloody fluid, sent for Gram stain and culture. IMPRESSION: 1. Technically successful CT-guided aspiration of right medial thigh fluid collection, returning scant blood, sent for Gram stain and culture. Electronically Signed   By: Lucrezia Europe M.D.   On: 08/20/2016 10:24    Scheduled Meds: . aspirin  81 mg Oral Daily  . chlorhexidine  15 mL Mouth Rinse BID  . enoxaparin (LOVENOX) injection  40 mg Subcutaneous Q24H  . escitalopram  10 mg Per Tube Daily  . feeding supplement (PRO-STAT SUGAR FREE 64)  30 mL Per Tube TID WC  . folic acid-pyridoxine-cyancobalamin  1 tablet Per Tube Daily  . fosfomycin  3 g Oral Q72H  . free water  200 mL Per Tube Q8H  . mouth rinse  15 mL Mouth Rinse q12n4p  . meropenem (MERREM) IV  1 g Intravenous Q12H  . metoprolol tartrate  25 mg Per Tube BID  . multivitamin  1 tablet Oral Daily  . pantoprazole sodium  40 mg Per Tube Daily  . polyethylene glycol  17 g Per Tube Daily  . pravastatin  40 mg Per Tube Daily  . Valproate Sodium  200 mg Per Tube Q8H  . vancomycin  750 mg Intravenous Q12H  . ascorbic acid  500 mg Oral Daily  . zinc sulfate  220 mg Oral Daily   Continuous Infusions: . feeding supplement (OSMOLITE 1.5 CAL) 1,000 mL (08/19/16 1937)     LOS: 12 days   Time spent: 25 minutes.  Vance Gather, MD [facility] Pager [phone]  If 7PM-7AM, please contact night-coverage [email or web address] Password [facility] 08/20/2016, 12:34 PM

## 2016-08-20 NOTE — Progress Notes (Signed)
Pt. Had 10 beats v-tach not sustained followed by 7 beats v-tach not sustained. Nurse at bedside during alerts. Pt. Asymptomatic. Vital signs taken, and stable. MD notified.

## 2016-08-20 NOTE — Progress Notes (Signed)
Pharmacy Antibiotic Note  Katherine MutaDiane Ernestina Palmer is a 55 y.o. female admitted on 08/08/2016 for meropenem and vancomycin per ID in the setting of Pseudomonas/ESBL E.coli UTI with persistent fever and chronic osteo. Continuing course of fosfomycin to cover pseudomonas -  50K CFu unsure of significance. Plan for CT pelvis to r/o pyelo/stones. SCr likely falsely low due to immobility, left AKA, and hemiparesis. Need 6 wks abx per ID with merrem and vanc.  Plan: -Increase vancomycin to 750 mg IV q 12 hr -Continue Meropenem 1g q12h  -Continue Fosfomycin 3g q72h x3 doses -Monitor clinical progress, c/s, renal function, abx plan/LOT VT@SS  as indicated   Antimicrobials this admission:  11/23 CTX>> 11/23 11/23 ceftaz >> 11/26 11/28 fosfomycin >> (12/7) 11/26 Meropenem >> 11/27; 11/29 >> 12/2 vancomycin>>  Dose adjustments this admission:  12/5: trough of 4 (24 hour level) on vancomycin 750 mg q 24 hr  Microbiology results:  11/23 urine cx >> > 100,000 colonies ESBL E.Coli, 50,000 colonies Pseudomonas (R-cipro, R-imipenem) 11/23 blood cx >> NGF 11/29 blood cx>>ngf 12/4 wound cx>> ngtd  Previous Admission: 11/3 (old) urine cx >> 40,000 pseudomonas (R to cipro and imipenem) 10/31-11/4 CTX > 11/4-11/6 cefepime  10/31-11/2 Vanc   Temp (24hrs), Avg:99.1 F (37.3 C), Min:98.8 F (37.1 C), Max:99.4 F (37.4 C)   Recent Labs Lab 08/14/16 0605 08/15/16 0524 08/16/16 0415 08/17/16 0439 08/18/16 0458 08/19/16 0535 08/20/16 0821 08/20/16 1134  WBC 9.9 10.0 8.8 9.7 9.7 9.7 9.2  --   CREATININE 0.36* <0.30*  --   --   --  <0.30*  --   --   VANCOTROUGH  --   --   --   --   --   --   --  4*    No Known Allergies   Thank you for allowing us to participate in this patients care. Signe Coltonya C Bharath Bernstein, PharmD Pager: [phone] 12:26 PM, 08/20/2016

## 2016-08-21 ENCOUNTER — Inpatient Hospital Stay (HOSPITAL_COMMUNITY): Payer: Medicaid Other

## 2016-08-21 DIAGNOSIS — L02415 Cutaneous abscess of right lower limb: Secondary | ICD-10-CM

## 2016-08-21 DIAGNOSIS — I69391 Dysphagia following cerebral infarction: Secondary | ICD-10-CM

## 2016-08-21 DIAGNOSIS — A4151 Sepsis due to Escherichia coli [E. coli]: Principal | ICD-10-CM

## 2016-08-21 DIAGNOSIS — T83511D Infection and inflammatory reaction due to indwelling urethral catheter, subsequent encounter: Secondary | ICD-10-CM

## 2016-08-21 DIAGNOSIS — N390 Urinary tract infection, site not specified: Secondary | ICD-10-CM

## 2016-08-21 DIAGNOSIS — I10 Essential (primary) hypertension: Secondary | ICD-10-CM

## 2016-08-21 DIAGNOSIS — K839 Disease of biliary tract, unspecified: Secondary | ICD-10-CM

## 2016-08-21 LAB — CBC
HEMATOCRIT: 28.9 % — AB (ref 36.0–46.0)
HEMOGLOBIN: 8.8 g/dL — AB (ref 12.0–15.0)
MCH: 28.9 pg (ref 26.0–34.0)
MCHC: 30.4 g/dL (ref 30.0–36.0)
MCV: 94.8 fL (ref 78.0–100.0)
Platelets: 583 10*3/uL — ABNORMAL HIGH (ref 150–400)
RBC: 3.05 MIL/uL — AB (ref 3.87–5.11)
RDW: 19.8 % — ABNORMAL HIGH (ref 11.5–15.5)
WBC: 10.4 10*3/uL (ref 4.0–10.5)

## 2016-08-21 LAB — BASIC METABOLIC PANEL
Anion gap: 6 (ref 5–15)
BUN: 15 mg/dL (ref 6–20)
CHLORIDE: 101 mmol/L (ref 101–111)
CO2: 29 mmol/L (ref 22–32)
Calcium: 8.4 mg/dL — ABNORMAL LOW (ref 8.9–10.3)
Glucose, Bld: 130 mg/dL — ABNORMAL HIGH (ref 65–99)
POTASSIUM: 4.1 mmol/L (ref 3.5–5.1)
Sodium: 136 mmol/L (ref 135–145)

## 2016-08-21 LAB — GLUCOSE, CAPILLARY
GLUCOSE-CAPILLARY: 130 mg/dL — AB (ref 65–99)
Glucose-Capillary: 116 mg/dL — ABNORMAL HIGH (ref 65–99)
Glucose-Capillary: 138 mg/dL — ABNORMAL HIGH (ref 65–99)
Glucose-Capillary: 95 mg/dL (ref 65–99)

## 2016-08-21 MED ORDER — FOSFOMYCIN TROMETHAMINE 3 G PO PACK
3.0000 g | PACK | Freq: Once | ORAL | Status: AC
  Administered 2016-08-21: 3 g
  Filled 2016-08-21: qty 3

## 2016-08-21 NOTE — Progress Notes (Signed)
PROGRESS NOTE  Katherine Palmer FUX:[id number] DOB: [birth year] DOA: 08/08/2016 PCP: Gildardo Cranker, DO  Brief History:  Patient is a 54 y.o.femalewith history of CVA with left hemiparesis, with left AKA, chronic right lower extremity contracture, stage IV decub ulcer with colostomy and PEG tube for feeding due to dysphagia, seizure disorder and HTNwas brought to the ER from the skilled nursing facility after patient was found to have altered mental status. She was thought to be encephalopathic from a UTI. She was subsequently admitted for further evaluation and treatment. IV antibiotics were tailored to urine culture results showing ESBL E.coli and pseudomonas, but fevers continued. CT abdomen and pelvis showed evidence of pyelonephritis and MRI of hips and sacrum due to chronic decubitus ulcers showed evidence of osteomyelitis and soft tissue abscess around the right hip. Orthopedic surgery was consulted and recommended IR drainage which has been ordered. She continues to have relapsing fevers despite broad-spectrum antibiotics.  Assessment/Plan: Sepsis from ESBL E Coli and Pseudomonas pyelonephritis: Sepsis improving but not resolving, still febrile but hemodynamically stable. CT showed left perinephric stranding as well as concern for LLL consolidation though suspicion for PNA remains low and abx would be covering HCAP pathogens regardless. UCx with >100,000 CFUESBL Escherichia coli and >50,000 CFU of Pseudomonas Aeruginosa. Suspect zosyn (with MIC 32) was inadequately treating infections.  - ID consulted, restarted meropenem/vancomycin x6 weeks.  -Stop date Jan 5. Labs weekly while on IV antibiotics: _x_ CBC with differential _x_ BMP twice per week _x_ CRP _x_ ESR _x_ Vancomycin trough twice per week (target 15-20) _x_ Please pull PIC at completion of IV antibiotics - Fax weekly labs to [phone] - Follow up in [facility] 4-6 weeks - Blood culltures 11/23 no growth and 11/29  NGTD - Replaced  Foley 08/20/16  right hip/adductor abscess: With recurrent fevers despite IV antibiotics, suspect abscess is etiology.  - Orthopedic surgery, Dr. 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 Unchanged in appearance frompictures taken on 11/24 by Dr. Sloan Leiter. - Appreciate WOC assistance: wounds unchanged since admission with exposed bone - MRI shows chronic osteomyelitis of sacrum and coccyx with concern for acute osteomyelitis in iliac bones and enhancing fluid collection in right hipp adductor musculature, possibly deep tissue abscess.  - No need for debridement per ortho - ESR 94, CRP 1.3.  -place PICC  Acute osteomyelitis bilateral ilia -08/16/16 MRI hip--marrow enhancement lateral iliac bones suspicious for acute osteomyelitis -Continue meropenem and vanco  Biliary and pancreatic duct dilitation: With normal lipase and LFTs, doubt this is of clinical significance in pt that has no abdominal pain.  -MRCP if able  Acute metabolic encephalopathy:  -EEG not performed due to hair matting.  - Stable.Suspect likely due to sepsis on abnormal baseline.  Hypertension:  -Continue with Metoprolol 25 mg po per PEG BID -C/w Hydralazine 10 mg IV q4hprn for SBP >160   Hyperkalemia:Resolved - Repeat BMP in AM  History of CVA with residual left-sided Hemiplegia, Dysarthria and Dysphagia:  -PEG tube in place--tolerating enteral feeding -Continue Aspirin 81 mgand Statin.   Seizure Disorder:  -Continue Valproate Sodium 200 mg Per PEG q8h:  - level was < 10 on admission (goal 50-100): ?wasn't getting this, suggest recheck on follow up  Chronic anemia: Stable.  - Hgb stable postop. Will continue to monitor.   Depression -C/w Escitalopram 10 mg Per PEG Daily  S/P Left AKA (  above knee amputation)  DVT prophylaxis: Lovenox Code Status: Full Family  Communication: None at bedside this AM; Disposition Plan: SNF 12/7 or 12/8, requiring IV antibiotics  Consultants:   Infectious disease, Dr. Johnnye Sima by phone 11/27 and Dr. Baxter Flattery by phone 11/28.  ID, Dr. Baxter Flattery formally consulted 11/29.  Orthopedic surgery, Dr. Ninfa Linden  General surgery 12/1, Dr. Rosendo Gros  Interventional radiology, Dr. Annamaria Boots  Procedures:   Aspiration of deep tissue abscess 12/4  Antimicrobials:  Ceftriaxone 11/23  Ceftazidime 11/23 > 11/26  Meropenem 11/26 > 11/28, restarted 11/29  Zosyn 11/27 > 11/28  Fosfomycin 11/28 >> 11/29  Vancomycin 12/2, 12/6>>     Subjective: Patient denies fevers, chills, headache, chest pain, dyspnea, nausea, vomiting, diarrhea, abdominal pain, dysuria, hematuria, hematochezia   Objective: Vitals:   08/20/16 1240 08/20/16 1349 08/20/16 2120 08/21/16 1400  BP: 126/75 120/66 (!) 153/82 132/66  Pulse: 81 85 (!) 103 84  Resp:  _0 Temp: 98.7 F (37.1 C) 99.2 F (37.3 C) 98.6 F (37 C) 98.1 F (36.7 C)  TempSrc: Oral Oral Oral Oral  SpO2: 100% 100% 100% 99%  Weight:        Intake/Output Summary (Last 24 hours) at 08/21/16 1628 Last data filed at 08/21/16 1500  Gross per 24 hour  Intake             1050 ml  Output             1300 ml  Net             -250 ml   Weight change:  Exam:   General:  Pt is alert, follows commands appropriately, not in acute distress  HEENT: No icterus, No thrush, No neck mass, Peridot/AT  Cardiovascular: RRR, S1/S2, no rubs, no gallops  Respiratory: poor inspiratory effort but CTA bilaterally, no wheezing, no crackles, no rhonchi  Abdomen: Soft/+BS, non tender, non distended, no guarding Extremities: left AKA; R LE without lymphangitis, +trace edema  Data Reviewed: I have personally reviewed following labs and imaging studies Basic Metabolic Panel:  Recent Labs Lab 08/15/16 0524 08/19/16 0535 08/21/16 0612  NA 140 135 136  K 4.1 4.7 4.1  CL 112* 103 101  CO2  _1 GLUCOSE 135* 97 130*  BUN 21* 18 15  CREATININE <0.30* <0.30* <0.30*  CALCIUM 8.3* 8.6* 8.4*   Liver Function Tests: No results for input(s): AST, ALT, ALKPHOS, BILITOT, PROT, ALBUMIN in the last 168 hours.  Recent Labs Lab 08/15/16 0836  LIPASE 23   No results for input(s): AMMONIA in the last 168 hours. Coagulation Profile:  Recent Labs Lab 08/19/16 0535  INR 1.18   CBC:  Recent Labs Lab 08/17/16 0439 08/18/16 0458 08/19/16 0535 08/20/16 0821 08/21/16 0612  WBC 9.7 9.7 9.7 9.2 10.4  HGB 8.9* 9.4* 8.8* 8.6* 8.8*  HCT 30.0* 31.5* 29.3* 28.1* 28.9*  MCV 98.0 98.1 97.0 95.3 94.8  PLT 369 397 496* 553* 583*   Cardiac Enzymes: No results for input(s): CKTOTAL, CKMB, CKMBINDEX, TROPONINI in the last 168 hours. BNP: Invalid input(s): POCBNP CBG:  Recent Labs Lab 08/20/16 2013 08/21/16 0018 08/21/16 0742 08/21/16 1116 08/21/16 1603  GLUCAP 155* 130* 116* 138* 95   HbA1C: No results for input(s): HGBA1C in the last 72 hours. Urine analysis:    Component Value Date/Time   COLORURINE AMBER (A) 08/08/2016 0115   APPEARANCEUR CLOUDY (A) 08/08/2016 0115   LABSPEC 1.024 08/08/2016 0115   PHURINE 5.5 08/08/2016 0115  GLUCOSEU NEGATIVE 08/08/2016 0115   HGBUR MODERATE (A) 08/08/2016 0115   BILIRUBINUR NEGATIVE 08/08/2016 0115   KETONESUR NEGATIVE 08/08/2016 0115   PROTEINUR 30 (A) 08/08/2016 0115   NITRITE POSITIVE (A) 08/08/2016 0115   LEUKOCYTESUR MODERATE (A) 08/08/2016 0115   Sepsis Labs: _0 (procalcitonin:4,lacticidven:4) ) Recent Results (from the past 240 hour(s))  Culture, blood (routine x 2)     Status: None   Collection Time: 08/14/16  8:02 PM  Result Value Ref Range Status   Specimen Description BLOOD RIGHT HAND  Final   Special Requests IN PEDIATRIC BOTTLE 3CC  Final   Culture NO GROWTH 5 DAYS  Final   Report Status 08/19/2016 FINAL  Final  Culture, blood (routine x 2)     Status: None   Collection Time: 08/14/16  8:02 PM   Result Value Ref Range Status   Specimen Description BLOOD RIGHT HAND  Final   Special Requests IN PEDIATRIC BOTTLE 3CC  Final   Culture NO GROWTH 5 DAYS  Final   Report Status 08/19/2016 FINAL  Final  Aerobic/Anaerobic Culture (surgical/deep wound)     Status: None (Preliminary result)   Collection Time: 08/19/16  9:41 PM  Result Value Ref Range Status   Specimen Description WOUND RIGHT THIGH  Final   Special Requests Normal  Final   Gram Stain   Final    FEW WBC PRESENT,BOTH PMN AND MONONUCLEAR NO ORGANISMS SEEN    Culture NO GROWTH 2 DAYS  Final   Report Status PENDING  Incomplete     Scheduled Meds: . aspirin  81 mg Oral Daily  . chlorhexidine  15 mL Mouth Rinse BID  . enoxaparin (LOVENOX) injection  40 mg Subcutaneous Q24H  . escitalopram  10 mg Per Tube Daily  . feeding supplement (PRO-STAT SUGAR FREE 64)  30 mL Per Tube TID WC  . folic acid-pyridoxine-cyancobalamin  1 tablet Per Tube Daily  . free water  200 mL Per Tube Q8H  . mouth rinse  15 mL Mouth Rinse q12n4p  . meropenem (MERREM) IV  1 g Intravenous Q12H  . metoprolol tartrate  25 mg Per Tube BID  . multivitamin  1 tablet Oral Daily  . pantoprazole sodium  40 mg Per Tube Daily  . polyethylene glycol  17 g Per Tube Daily  . pravastatin  40 mg Per Tube Daily  . Valproate Sodium  200 mg Per Tube Q8H  . vancomycin  750 mg Intravenous Q12H  . ascorbic acid  500 mg Oral Daily  . zinc sulfate  220 mg Oral Daily   Continuous Infusions: . feeding supplement (OSMOLITE 1.5 CAL) Stopped (08/21/16 1513)    Procedures/Studies: Mr Hip Right W Wo Contrast  Result Date: 08/16/2016 CLINICAL DATA:  55 year old with fever, right sacral decubitus ulcer and extensive chronic inflammatory changes around the right hip on CT. Previous left above the knee amputation. EXAM: MRI OF THE RIGHT HIP WITHOUT AND WITH CONTRAST MR SACRUM AND SACROILIAC JOINTS WITHOUT AND WITH CONTRAST TECHNIQUE: Multiplanar, multisequence MR imaging was  performed both before and after administration of intravenous contrast. CONTRAST:  10 ml MultiHance. COMPARISON:  CT 08/15/2016. FINDINGS: Bones/Joint/Cartilage : As correlated with CT, there is diffuse destruction, remodeling and sclerosis of the lower sacrum and coccyx, most consistent with chronic osteomyelitis. The sacroiliac joints and symphysis pubis are intact. Both hips are located. There is extensive heterotopic ossification surrounding the right hip joint. There is an associated complex right hip joint effusion with a large peripherally enhancing fluid  collection within the right adductor musculature. This fluid collection is best seen on the postcontrast images (series 37 through 42). It measures up to 3.9 x 12.7 x 3.2 cm. No other focal fluid collections are seen. There is some marrow edema and enhancement posteriorly in both iliac bones. No evidence of proximal femoral osteomyelitis. Ligaments Not relevant for exam/indication. Muscles and Tendons There is asymmetric T2 hyperintensity and enhancement throughout the right pelvic and proximal thigh musculature. As above, there is a large fluid collection within the right hip adductor musculature. The iliopsoas and hamstring tendons appear grossly intact. Soft tissues There is decubitus soft tissue ulceration posteriorly over the sacrum and right hip joint. There is diffuse edema and enhancement throughout the adjacent soft tissues, but no other focal fluid collections. Foley catheter and a small amount of free pelvic fluid noted. IMPRESSION: 1. Chronic destruction and remodeling of the lower sacrum and coccyx consistent with chronic osteomyelitis. There is marrow edema and enhancement posteriorly in both iliac bones, suspicious for osteomyelitis. 2. Extensive heterotopic ossification and synovitis surrounding the right hip with large peripherally enhancing fluid collection in the right hip adductor musculature, worrisome for soft tissue abscess. No  specific evidence of active osteomyelitis at the right hip. 3. Multifocal decubitus ulceration in the pelvis with associated generalized soft tissue inflammatory changes. Electronically Signed   By: Richardean Sale M.D.   On: 08/16/2016 11:31   Ct Abdomen Pelvis W Contrast  Result Date: 08/15/2016 CLINICAL DATA:  Acute onset of fever of unknown origin. Initial encounter. EXAM: CT ABDOMEN AND PELVIS WITH CONTRAST TECHNIQUE: Multidetector CT imaging of the abdomen and pelvis was performed using the standard protocol following bolus administration of intravenous contrast. CONTRAST:  100 mL ISOVUE-300 IOPAMIDOL (ISOVUE-300) INJECTION 61% COMPARISON:  CT of the abdomen and pelvis performed 03/10/2016 FINDINGS: Lower chest: Trace bilateral pleural effusions are noted. There is partial consolidation of the left lower lobe. The heart is enlarged. Scattered coronary artery calcification is noted. Hepatobiliary: A 6 mm hypodensity is noted within the left hepatic lobe. A calcified granuloma is noted within the liver. Small stones are seen dependently within the gallbladder. There is distention of the common bile duct to 8-9 mm in diameter. Pancreas: The pancreatic duct is dilated to 5 mm in diameter, raising concern for distal obstruction. Spleen: The spleen is unremarkable in appearance. Adrenals/Urinary Tract: The adrenal glands are unremarkable in appearance. Small bilateral renal cysts are noted. Nonspecific left-sided perinephric stranding is seen. There is no evidence of hydronephrosis. No renal or ureteral stones are identified, though evaluation for renal stones is limited given contrast in the renal calyces. Stomach/Bowel: The stomach is decompressed. A G-tube is noted ending at the body of the stomach. The small bowel is decompressed and grossly unremarkable. The appendix is normal in caliber, without evidence of appendicitis. Scattered diverticulosis is noted along the cecum and ascending colon, with a right  upper quadrant colostomy noted. The more distal colon is decompressed, with scattered diverticulosis along the transverse, descending and proximal sigmoid colon. There is no evidence of diverticulitis. Vascular/Lymphatic: Scattered calcification is seen along the abdominal aorta and its branches, including at the proximal renal arteries bilaterally. The abdominal aorta is otherwise grossly unremarkable. The inferior vena cava is grossly unremarkable. No retroperitoneal lymphadenopathy is seen. No pelvic sidewall lymphadenopathy is identified. Reproductive: The bladder is decompressed, with a Foley catheter in place, and a small amount of contrast in the bladder. The uterus is grossly unremarkable. The ovaries are relatively symmetric. No suspicious  adnexal masses are seen. Other: Diffuse soft-tissue phlegmon is noted at the right hip, with remodeling of the anterior right femur and acetabulum, and of the posterior sacrum and coccyx. Findings are compatible with chronic osteomyelitis and large sacral decubitus ulcerations. Mild phlegmon is noted at the left hip. Vague fluid is seen tracking along the musculature of the proximal right thigh. Presacral stranding is noted. Musculoskeletal: No acute osseous abnormalities are identified. The patient is status post thoracic spinal fusion. The visualized musculature is unremarkable in appearance. IMPRESSION: 1. Diffuse soft tissue phlegmon at the right hip. Remodeling of the anterior right femur and acetabulum, and at the posterior sacrum and coccyx, compatible with chronic osteomyelitis, due to underlying large sacral decubitus ulcerations. Mild phlegmon at the left hip. Vague fluid tracks along the musculature of the proximal right thigh, without focal abscess. Presacral stranding noted. 2. Trace bilateral pleural effusions. Partial consolidation of the left lower lobe, which could reflect pneumonia. 3. Dilatation of the common bile duct to 8-9 mm in diameter, and  distention of the pancreatic duct to 5 mm, raising concern for distal obstruction. MRCP or ERCP could be considered for further evaluation, as deemed clinically appropriate. 4. Cholelithiasis. 5. Cardiomegaly.  Scattered coronary artery calcifications noted. 6. 6 mm nonspecific hypodensity within the left hepatic lobe. 7. Scattered diverticulosis along the cecum, and ascending, transverse, descending and proximal sigmoid colon. Right upper quadrant colostomy is unremarkable in appearance. No evidence of diverticulitis. 8. Scattered aortic atherosclerosis. Calcification at the proximal renal arteries bilaterally. Electronically Signed   By: Garald Balding M.D.   On: 08/15/2016 02:12   Mr Sacrum Si Joints W Wo Contrast  Result Date: 08/16/2016 CLINICAL DATA:  55 year old with fever, right sacral decubitus ulcer and extensive chronic inflammatory changes around the right hip on CT. Previous left above the knee amputation. EXAM: MRI OF THE RIGHT HIP WITHOUT AND WITH CONTRAST MR SACRUM AND SACROILIAC JOINTS WITHOUT AND WITH CONTRAST TECHNIQUE: Multiplanar, multisequence MR imaging was performed both before and after administration of intravenous contrast. CONTRAST:  10 ml MultiHance. COMPARISON:  CT 08/15/2016. FINDINGS: Bones/Joint/Cartilage : As correlated with CT, there is diffuse destruction, remodeling and sclerosis of the lower sacrum and coccyx, most consistent with chronic osteomyelitis. The sacroiliac joints and symphysis pubis are intact. Both hips are located. There is extensive heterotopic ossification surrounding the right hip joint. There is an associated complex right hip joint effusion with a large peripherally enhancing fluid collection within the right adductor musculature. This fluid collection is best seen on the postcontrast images (series 37 through 42). It measures up to 3.9 x 12.7 x 3.2 cm. No other focal fluid collections are seen. There is some marrow edema and enhancement posteriorly in  both iliac bones. No evidence of proximal femoral osteomyelitis. Ligaments Not relevant for exam/indication. Muscles and Tendons There is asymmetric T2 hyperintensity and enhancement throughout the right pelvic and proximal thigh musculature. As above, there is a large fluid collection within the right hip adductor musculature. The iliopsoas and hamstring tendons appear grossly intact. Soft tissues There is decubitus soft tissue ulceration posteriorly over the sacrum and right hip joint. There is diffuse edema and enhancement throughout the adjacent soft tissues, but no other focal fluid collections. Foley catheter and a small amount of free pelvic fluid noted. IMPRESSION: 1. Chronic destruction and remodeling of the lower sacrum and coccyx consistent with chronic osteomyelitis. There is marrow edema and enhancement posteriorly in both iliac bones, suspicious for osteomyelitis. 2. Extensive heterotopic ossification and synovitis  surrounding the right hip with large peripherally enhancing fluid collection in the right hip adductor musculature, worrisome for soft tissue abscess. No specific evidence of active osteomyelitis at the right hip. 3. Multifocal decubitus ulceration in the pelvis with associated generalized soft tissue inflammatory changes. Electronically Signed   By: Richardean Sale M.D.   On: 08/16/2016 11:31   Ct Aspiration  Result Date: 08/20/2016 CLINICAL DATA:  55 year old with fever, right sacral decubitus ulcer and extensive chronic inflammatory changes around the right hip on CT. Previous left above the knee amputation.  Recent MR demonstrates Chronic destruction and remodeling of the lower sacrum and coccyxconsistent with chronic osteomyelitis. There is marrow edema andenhancement posteriorly in both iliac bones, suspicious forosteomyelitis. Extensive heterotopic ossification and synovitis surrounding theright hip with large peripherally enhancing fluid collection in theright hip adductor  musculature, worrisome for soft tissue abscess.No specific evidence of active osteomyelitis at the right hip. Multifocal decubitus ulceration in the pelvis with associatedgeneralized soft tissue inflammatory changes. EXAM: CT GUIDED ASPIRATION BIOPSY OF RIGHT THIGH ANESTHESIA/SEDATION: Intravenous Fentanyl and Versed were administered as conscious sedation during continuous monitoring of the patient's level of consciousness and physiological / cardiorespiratory status by the radiology RN, with a total moderate sedation time of 20 minutes. PROCEDURE: The procedure risks, benefits, and alternatives were explained to the patient. Questions regarding the procedure were encouraged and answered. The patient understands and consents to the procedure. select axial scans through the right hip and upper thigh were obtained. The adductor musculature collection was localized based on recent MR imaging. An appropriate skin entry site was determined and marked. The operative field was prepped with chlorhexidinein a sterile fashion, and a sterile drape was applied covering the operative field. A sterile gown and sterile gloves were used for the procedure. Local anesthesia was provided with 1% Lidocaine. Under CT fluoroscopic guidance, an 18 gauge trocar needle was advanced into the collection. A scant amount of bloody fluid was aspirated, sent for Gram stain and culture. Postprocedure scans show no hemorrhage or other apparent complication. The patient tolerated the procedure well. COMPLICATIONS: None immediate FINDINGS: Poorly marginated low attenuation in the right adductor musculature corresponding to fluid signal process noted on reviewed previous MRI. CT-guided aspiration returned only a scant amount of bloody fluid, sent for Gram stain and culture. IMPRESSION: 1. Technically successful CT-guided aspiration of right medial thigh fluid collection, returning scant blood, sent for Gram stain and culture. Electronically Signed    By: Lucrezia Europe M.D.   On: 08/20/2016 10:24   Dg Chest Port 1 View  Result Date: 08/08/2016 CLINICAL DATA:  Sepsis. Altered mental status and lethargy tonight. History of strokes. EXAM: PORTABLE CHEST 1 VIEW COMPARISON:  05/16/2016 FINDINGS: Postoperative changes in the thoracic spine. Shallow inspiration. Cardiac enlargement. Mild pulmonary vascular congestion. Linear atelectasis in the mid lungs bilaterally. No edema or consolidation. Costophrenic angles are obscured and can't exclude small pleural effusions. No pneumothorax. Calcified aorta. Old right rib fractures. IMPRESSION: Cardiac enlargement with pulmonary vascular congestion. No edema or consolidation. Linear atelectasis in the mid lungs. Electronically Signed   By: Lucienne Capers M.D.   On: 08/08/2016 00:52    Rosilyn Coachman, DO  [facility] Pager [phone]  If 7PM-7AM, please contact night-coverage [email or web address] Password [email or web address] 08/21/2016, 4:28 PM   LOS: 13 days

## 2016-08-21 NOTE — Progress Notes (Signed)
Verbal order from MD to replace foley. Foley placed from SNF removed and 16 Fr. Foley placed. Foley being replaced for chronic catheter use as well as healing of stage four ulcer on sacrum.

## 2016-08-22 DIAGNOSIS — L89304 Pressure ulcer of unspecified buttock, stage 4: Secondary | ICD-10-CM

## 2016-08-22 DIAGNOSIS — I69354 Hemiplegia and hemiparesis following cerebral infarction affecting left non-dominant side: Secondary | ICD-10-CM

## 2016-08-22 LAB — GLUCOSE, CAPILLARY
GLUCOSE-CAPILLARY: 115 mg/dL — AB (ref 65–99)
GLUCOSE-CAPILLARY: 123 mg/dL — AB (ref 65–99)
GLUCOSE-CAPILLARY: 125 mg/dL — AB (ref 65–99)

## 2016-08-22 LAB — VANCOMYCIN, TROUGH: VANCOMYCIN TR: 7 ug/mL — AB (ref 15–20)

## 2016-08-22 MED ORDER — VANCOMYCIN HCL IN DEXTROSE 1-5 GM/200ML-% IV SOLN: 1000.0000 mg | Freq: Three times a day (TID) | INTRAVENOUS | 90 refills | Status: AC

## 2016-08-22 MED ORDER — SODIUM CHLORIDE 0.9% FLUSH: 10.0000 mL | INTRAVENOUS | Status: DC | PRN

## 2016-08-22 MED ORDER — HEPARIN SOD (PORK) LOCK FLUSH 100 UNIT/ML IV SOLN
250.0000 [IU] | INTRAVENOUS | Status: AC | PRN
  Administered 2016-08-22: 250 [IU]

## 2016-08-22 MED ORDER — VANCOMYCIN HCL IN DEXTROSE 1-5 GM/200ML-% IV SOLN
1000.0000 mg | Freq: Three times a day (TID) | INTRAVENOUS | Status: DC
  Administered 2016-08-22: 1000 mg via INTRAVENOUS
  Filled 2016-08-22 (×3): qty 200

## 2016-08-22 MED ORDER — LORAZEPAM 0.5 MG PO TABS: 0.5000 mg | ORAL_TABLET | Freq: Three times a day (TID) | ORAL | 0 refills | Status: DC | PRN

## 2016-08-22 MED ORDER — VANCOMYCIN HCL IN DEXTROSE 1-5 GM/200ML-% IV SOLN: 1000.0000 mg | Freq: Three times a day (TID) | INTRAVENOUS | 60 refills | Status: DC

## 2016-08-22 MED ORDER — MEROPENEM IV (FOR PTA / DISCHARGE USE ONLY)
1.0000 g | Freq: Two times a day (BID) | INTRAVENOUS | 0 refills | Status: AC
Start: 2016-08-22 — End: 2016-09-22

## 2016-08-22 MED ORDER — VANCOMYCIN HCL IN DEXTROSE 750-5 MG/150ML-% IV SOLN: 750.0000 mg | Freq: Two times a day (BID) | INTRAVENOUS | 0 refills | Status: DC

## 2016-08-22 MED ORDER — VANCOMYCIN IV (FOR PTA / DISCHARGE USE ONLY)
750.0000 mg | Freq: Two times a day (BID) | INTRAVENOUS | 0 refills | Status: DC
Start: 2016-08-22 — End: 2016-08-26

## 2016-08-22 MED ORDER — OXYCODONE HCL 5 MG PO TABA: 5.0000 mg | ORAL_TABLET | Freq: Four times a day (QID) | ORAL | 0 refills | Status: DC | PRN

## 2016-08-22 MED ORDER — SODIUM CHLORIDE 0.9 % IV SOLN
1.0000 g | Freq: Two times a day (BID) | INTRAVENOUS | 0 refills | Status: DC
Start: 2016-08-22 — End: 2016-08-26

## 2016-08-22 MED ORDER — FREE WATER: 200.0000 mL | Freq: Three times a day (TID) | 99 refills | Status: AC

## 2016-08-22 NOTE — Progress Notes (Signed)
Pharmacy Antibiotic Note  Katherine Palmer is a 55 y.o. female admitted on 08/08/2016 for meropenem and vancomycin per ID in the setting of Pseudomonas/ESBL E.coli UTI with persistent fever and chronic osteo. Continuing course of fosfomycin to cover pseudomonas -  50K CFu unsure of significance. Plan for CT pelvis to r/o pyelo/stones. SCr likely falsely low due to immobility, left AKA, and hemiparesis. Need 6 wks abx per ID with merrem and vanc. Vancomycin trough back at 7 (~11 hour level)  Plan: -Increase vancomycin to 1000 mg IV q 8 hr -Continue Meropenem 1g q12h  -Monitor clinical progress, c/s, renal function -Pt to discharge today, will follow up with trough outpatient tomorrow and twice weekly  Antimicrobials this admission:  11/23 CTX>> 11/23 11/23 ceftaz >> 11/26 11/28 fosfomycin >> (12/7) 11/26 Meropenem >> 11/27; 11/29 >> 12/2 vancomycin>>  Dose adjustments this admission:  12/5: trough of 4 (24 hour level) on vancomycin 750 mg q 24 hr 12/7: trough of 7 (~11 hour level) on vancomycin 750 mg q 12 hr  Microbiology results:  11/23 urine cx >> > 100,000 colonies ESBL E.Coli, 50,000 colonies Pseudomonas (R-cipro, R-imipenem) 11/23 blood cx >> NGF 11/29 blood cx>>ngf 12/4 wound cx>> ngtd  Previous Admission: 11/3 (old) urine cx >> 40,000 pseudomonas (R to cipro and imipenem) 10/31-11/4 CTX > 11/4-11/6 cefepime  10/31-11/2 Vanc   Temp (24hrs), Avg:98.4 F (36.9 C), Min:98.1 F (36.7 C), Max:98.7 F (37.1 C)   Recent Labs Lab 08/17/16 0439 08/18/16 0458 08/19/16 0535 08/20/16 0821 08/20/16 1134 08/21/16 0612 08/22/16 1148  WBC 9.7 9.7 9.7 9.2  --  10.4  --   CREATININE  --   --  <0.30*  --   --  <0.30*  --   VANCOTROUGH  --   --   --   --  4*  --  7*    No Known Allergies   Thank you for allowing us to participate in this patients care. Signe Coltonya C Tyronne Blann, PharmD Pager: [phone] 1:36 PM, 08/22/2016

## 2016-08-22 NOTE — Progress Notes (Signed)
Attempted to call both pts daughters and son for PICC consent.  No answer from any number listed on chart.  Will try again later.

## 2016-08-22 NOTE — Progress Notes (Signed)
CSW continuing to follow for discharge needs.  Chaunta Bejarano LCSWA [phone]  

## 2016-08-22 NOTE — Discharge Summary (Addendum)
Physician Discharge Summary  Katherine Palmer EFE:[id number] DOB: 1960-10-23 DOA: 08/08/2016  PCP: Gildardo Cranker, DO  Admit date: 08/08/2016 Discharge date: 08/22/2016  Admitted From: SNF Disposition:  SNF  Recommendations for Outpatient Follow-up:  1. Follow up with PCP in 1-2 weeks 2. Please obtain BMP/CBC, ESR, CRP every 7 days starting 08/26/16 while on antibiotics 3. Discontinue picc line after last dose of antibiotics 4. Vancomycin trough twice per week with target 15-20.  Discharge 5. Vancomycin trough at 1300 on 08/23/2016--please dose vancomycin at 0600, 1400, 2200    Discharge Condition:stable CODE STATUS:FULL Diet recommendation: Enteral feeding; Osmolite 1.5   Brief/Interim Summary: Patient is a 55 y.o.femalewith history of CVA with left hemiparesis, with left AKA, chronic right lower extremity contracture, stage IV decub ulcer with colostomy and PEG tube for feeding due to dysphagia, seizure disorder and HTNwas brought to the ER from the skilled nursing facility after patient was found to have altered mental status. She was thought to be encephalopathic from a UTI. She was subsequently admitted for further evaluation and treatment. IV antibiotics were tailored to urine culture results showing ESBL E.coli and pseudomonas, but fevers continued. CT abdomen and pelvis showed evidence of pyelonephritis and MRI of hips and sacrum due to chronic decubitus ulcers showed evidence of osteomyelitis and soft tissue abscess around the right hip. Orthopedic surgery was consulted and recommended IR drainage which has been ordered. She continues to have relapsing fevers despite broad-spectrum antibiotics.  ID was consulted and patient was started on vancomycin, merrem and fosfomycin.  She subsequently defervesced and remained clinically stable. She finished fosfomycin prior to discharge.  Discharge Diagnoses:  Sepsis from ESBL E Coli and Pseudomonas pyelonephritis: Sepsis improving but  not resolving, still febrile but hemodynamically stable. CT showed left perinephric stranding as well as concern for LLL consolidation though suspicion for PNA remains low and abx would be covering HCAP pathogens regardless. UCx with >100,000 CFUESBL Escherichia coli and >50,000 CFU of Pseudomonas Aeruginosa. Suspect zosyn (with MIC 32) was inadequately treating infections.  - ID consulted, restarted meropenem/vancomycin x6 weeks.  -Stop date Sep 20, 2016 Labs weekly while on IV antibiotics: _x_ CBC with differential _x_ BMP twice per week _x_ CRP _x_ ESR _x_ Vancomycin trough twice per week (target 15-20) _x_ Please pull PIC at completion of IV antibiotics - Fax weekly labs to [phone] - Follow up in [facility] 4-6 weeks - Blood culltures 11/23 no growth and 11/29 NGTD - Replaced  Foley 08/20/16  right hip/adductor abscess:With recurrent fevers despite IV antibiotics, suspect abscess is etiology.  - Orthopedic surgery, Dr. Ninfa Linden recommended IR drainage  - IR drainage 12/4--cultures unrevealing  Chronic osteomyelitis due to stage IV decubitus ulcers:Present PTA.Unchanged in appearance frompictures taken on 11/24 by Dr. Sloan Leiter. - Appreciate WOC assistance: wounds unchanged since admission with exposed bone - MRI shows chronic osteomyelitis of sacrum and coccyx with concern for acute osteomyelitis in iliac bones and enhancing fluid collection in right hipp adductor musculature, possibly deep tissue abscess.  - No need for debridement per ortho - ESR 94, CRP 1.3.  -place PICC 08/22/16  Acute osteomyelitis bilateral ilia -08/16/16 MRI hip--marrow enhancement lateral iliac bones suspicious for acute osteomyelitis -Continue meropenem and vanco  Biliary and pancreatic duct dilitation:With normal lipase and LFTs, doubt this is of clinical significance in pt that has no abdominal pain.  -MRCP--mild, borderline dilatation without obvious stone or mass. Dr. dilatation likely represented  a normal variant in the setting of normal LFTs and bilirubin.  Acute metabolic  encephalopathy:  -EEG not performed due to hair matting.  - Stable.Suspect likely due to sepsis on abnormal baseline.  Hypertension:  -Continue with Metoprolol 25 mg po per PEG BID -C/w Hydralazine 10 mg IV q4hprn for SBP >160   Hyperkalemia:Resolved - Repeat BMP in AM  History of CVA with residual left-sided Hemiplegia, Dysarthria and Dysphagia:  -PEG tube in place--tolerating enteral feeding -Continue Aspirin 81 mgand Statin.   Seizure Disorder:  -Continue Valproate Sodium 200 mg Per PEG q8h:  - level was < 10 on admission (goal 50-100): ?wasn't getting this, suggest recheck on follow up  Chronic anemia: Stable.  - Hgb stable postop. Will continue to monitor.   Depression -C/w Escitalopram 10 mg Per PEG Daily  S/P Left AKA (above knee amputation)  DVT prophylaxis:Lovenox Code Status:Full   Consultants:  Infectious disease, Dr. Johnnye Sima by phone 11/27 and Dr. Baxter Flattery by phone 11/28.  ID, Dr. Baxter Flattery formally consulted 11/29.  Orthopedic surgery, Dr. Ninfa Linden  General surgery 12/1, Dr. Rosendo Gros  Interventional radiology, Dr. Annamaria Boots  Procedures:  Aspiration of deep tissue abscess 12/4  Antimicrobials:  Ceftriaxone 11/23  Ceftazidime 11/23 > 11/26  Meropenem 11/26 > 11/28, restarted 11/29  Zosyn 11/27 > 11/28  Fosfomycin 11/28 >> 11/29  Vancomycin 12/2, 12/6>>    Discharge Instructions  Discharge Instructions    Home infusion instructions [place] May follow [place] Dosing Protocol; May administer Cathflo as needed to maintain patency of vascular access device.; Flushing of vascular access device: per [facility] Protocol: 0.9% NaCl pre/post medica...    Complete by:  As directed    Instructions:  May follow [place] Dosing Protocol   Instructions:  May administer Cathflo as needed to maintain patency of vascular access device.    Instructions:  Flushing of vascular access device: per [facility] Protocol: 0.9% NaCl pre/post medication administration and prn patency; Heparin 100 u/ml, 64m for implanted ports and Heparin 10u/ml, 553mfor all other central venous catheters.   Instructions:  May follow [facility] Anaphylaxis Protocol for First Dose Administration in the home: 0.9% NaCl at 25-50 ml/hr to maintain IV access for protocol meds. Epinephrine 0.3 ml IV/IM PRN and Benadryl 25-50 IV/IM PRN s/s of anaphylaxis.   Instructions:  AdRewnfusion Coordinator (RN) to assist per patient IV care needs in the home PRN.       Medication List    STOP taking these medications   cloNIDine 0.1 MG tablet Commonly known as:  CATAPRES   UNABLE TO FIND     TAKE these medications   acetaminophen 325 MG tablet Commonly known as:  TYLENOL Place 650 mg into feeding tube every 4 (four) hours as needed for mild pain.   albuterol (2.5 MG/3ML) 0.083% nebulizer solution Commonly known as:  PROVENTIL Take 2.5 mg by nebulization every 4 (four) hours as needed for wheezing or shortness of breath.   ascorbic acid 500 MG tablet Commonly known as:  VITAMIN C Take 500 mg by mouth daily.   aspirin EC 81 MG tablet Take 81 mg by mouth daily. Via G-Tube   bisacodyl 10 MG suppository Commonly known as:  DULCOLAX Place 10 mg rectally daily as needed for moderate constipation.   collagenase ointment Commonly known as:  SANTYL Apply 1 application topically daily.   DECUBI-VITE Caps Take 1 capsule by mouth daily.   DEPAKENE PO Give 200 mg by tube every 8 (eight) hours.   escitalopram 5 MG tablet Commonly known as:  LEXAPRO Place 10 mg into  feeding tube daily.   feeding supplement (OSMOLITE 1.5 CAL) Liqd Place 1,000 mLs into feeding tube continuous.   feeding supplement (PRO-STAT SUGAR FREE 64) Liqd Place 30 mLs into feeding tube 3 (three) times daily with meals.   FOLBECAL 1 MG Tabs Give 1 tablet by tube daily.   free water  Soln Place 200 mLs into feeding tube every 8 (eight) hours.   ipratropium-albuterol 0.5-2.5 (3) MG/3ML Soln Commonly known as:  DUONEB Take 3 mLs by nebulization every 6 (six) hours as needed (sob).   LORazepam 0.5 MG tablet Commonly known as:  ATIVAN Place 1 tablet (0.5 mg total) into feeding tube every 8 (eight) hours as needed for anxiety.   magnesium hydroxide 400 MG/5ML suspension Commonly known as:  MILK OF MAGNESIA Place 30 mLs into feeding tube daily as needed for mild constipation.   meropenem 1 g in sodium chloride 0.9 % 100 mL Inject 1 g into the vein every 12 (twelve) hours. Last dose on 09/20/2016   meropenem IVPB Commonly known as:  MERREM Inject 1 g into the vein every 12 (twelve) hours. Indication: chronic osteomyelitis  Last Day of Therapy: 09/20/16 Labs - Once weekly:  CBC/D and BMP, Labs - Every other week:  ESR and CRP   metoprolol tartrate 25 MG tablet Commonly known as:  LOPRESSOR Take 1 tablet (25 mg total) by mouth 2 (two) times daily. What changed:  how to take this   OxyCODONE HCl (Abuse Deter) 5 MG Taba Commonly known as:  OXAYDO Take 5 mg by mouth every 6 (six) hours as needed (pain).   polyethylene glycol packet Commonly known as:  MIRALAX / GLYCOLAX Place 17 g into feeding tube daily.   pravastatin 40 MG tablet Commonly known as:  PRAVACHOL Place 40 mg into feeding tube daily.   PROTONIX 40 mg/20 mL Pack Generic drug:  pantoprazole sodium Place 40 mg into feeding tube daily.   vancomycin 1-5 GM/200ML-% Soln Commonly known as:  VANCOCIN Inject 200 mLs (1,000 mg total) into the vein every 8 (eight) hours. Last dose on 09/20/2016   vancomycin IVPB Inject 750 mg into the vein every 12 (twelve) hours. Indication:  osteomyelitis  Last Day of Therapy:  09/20/16 Labs - Sunday/Monday:  CBC/D, BMP, and vancomycin trough. Labs - Thursday:  BMP and vancomycin trough Labs - Every other week:  ESR and CRP   Zinc 100 MG Tabs Take 200 mg by mouth daily.              Home Infusion Instuctions        Start     Ordered   08/22/16 0000  Home infusion instructions [facility] May follow [place] Dosing Protocol; May administer Cathflo as needed to maintain patency of vascular access device.; Flushing of vascular access device: per [facility] Protocol: 0.9% NaCl pre/post medica...    Question Answer Comment  Instructions May follow [place] Dosing Protocol   Instructions May administer Cathflo as needed to maintain patency of vascular access device.   Instructions Flushing of vascular access device: per [facility] Protocol: 0.9% NaCl pre/post medication administration and prn patency; Heparin 100 u/ml, 1m for implanted ports and Heparin 10u/ml, 560mfor all other central venous catheters.   Instructions May follow [facility] Anaphylaxis Protocol for First Dose Administration in the home: 0.9% NaCl at 25-50 ml/hr to maintain IV access for protocol meds. Epinephrine 0.3 ml IV/IM PRN and Benadryl 25-50 IV/IM PRN s/s of anaphylaxis.   Instructions [facility]  Infusion Coordinator (RN) to assist per patient IV care needs in the home PRN.      08/22/16 1316      No Known Allergies     Procedures/Studies: Mr Hip Right W Wo Contrast  Result Date: 08/16/2016 CLINICAL DATA:  55 year old with fever, right sacral decubitus ulcer and extensive chronic inflammatory changes around the right hip on CT. Previous left above the knee amputation. EXAM: MRI OF THE RIGHT HIP WITHOUT AND WITH CONTRAST MR SACRUM AND SACROILIAC JOINTS WITHOUT AND WITH CONTRAST TECHNIQUE: Multiplanar, multisequence MR imaging was performed both before and after administration of intravenous contrast. CONTRAST:  10 ml MultiHance. COMPARISON:  CT 08/15/2016. FINDINGS: Bones/Joint/Cartilage : As correlated with CT, there is diffuse destruction, remodeling and sclerosis of the lower sacrum and coccyx, most consistent with chronic osteomyelitis. The sacroiliac joints and symphysis pubis  are intact. Both hips are located. There is extensive heterotopic ossification surrounding the right hip joint. There is an associated complex right hip joint effusion with a large peripherally enhancing fluid collection within the right adductor musculature. This fluid collection is best seen on the postcontrast images (series 37 through 42). It measures up to 3.9 x 12.7 x 3.2 cm. No other focal fluid collections are seen. There is some marrow edema and enhancement posteriorly in both iliac bones. No evidence of proximal femoral osteomyelitis. Ligaments Not relevant for exam/indication. Muscles and Tendons There is asymmetric T2 hyperintensity and enhancement throughout the right pelvic and proximal thigh musculature. As above, there is a large fluid collection within the right hip adductor musculature. The iliopsoas and hamstring tendons appear grossly intact. Soft tissues There is decubitus soft tissue ulceration posteriorly over the sacrum and right hip joint. There is diffuse edema and enhancement throughout the adjacent soft tissues, but no other focal fluid collections. Foley catheter and a small amount of free pelvic fluid noted. IMPRESSION: 1. Chronic destruction and remodeling of the lower sacrum and coccyx consistent with chronic osteomyelitis. There is marrow edema and enhancement posteriorly in both iliac bones, suspicious for osteomyelitis. 2. Extensive heterotopic ossification and synovitis surrounding the right hip with large peripherally enhancing fluid collection in the right hip adductor musculature, worrisome for soft tissue abscess. No specific evidence of active osteomyelitis at the right hip. 3. Multifocal decubitus ulceration in the pelvis with associated generalized soft tissue inflammatory changes. Electronically Signed   By: Richardean Sale M.D.   On: 08/16/2016 11:31   Ct Abdomen Pelvis W Contrast  Result Date: 08/15/2016 CLINICAL DATA:  Acute onset of fever of unknown origin.  Initial encounter. EXAM: CT ABDOMEN AND PELVIS WITH CONTRAST TECHNIQUE: Multidetector CT imaging of the abdomen and pelvis was performed using the standard protocol following bolus administration of intravenous contrast. CONTRAST:  100 mL ISOVUE-300 IOPAMIDOL (ISOVUE-300) INJECTION 61% COMPARISON:  CT of the abdomen and pelvis performed 03/10/2016 FINDINGS: Lower chest: Trace bilateral pleural effusions are noted. There is partial consolidation of the left lower lobe. The heart is enlarged. Scattered coronary artery calcification is noted. Hepatobiliary: A 6 mm hypodensity is noted within the left hepatic lobe. A calcified granuloma is noted within the liver. Small stones are seen dependently within the gallbladder. There is distention of the common bile duct to 8-9 mm in diameter. Pancreas: The pancreatic duct is dilated to 5 mm in diameter, raising concern for distal obstruction. Spleen: The spleen is unremarkable in appearance. Adrenals/Urinary Tract: The adrenal glands are unremarkable in appearance. Small bilateral renal cysts are noted. Nonspecific left-sided perinephric stranding is seen.  There is no evidence of hydronephrosis. No renal or ureteral stones are identified, though evaluation for renal stones is limited given contrast in the renal calyces. Stomach/Bowel: The stomach is decompressed. A G-tube is noted ending at the body of the stomach. The small bowel is decompressed and grossly unremarkable. The appendix is normal in caliber, without evidence of appendicitis. Scattered diverticulosis is noted along the cecum and ascending colon, with a right upper quadrant colostomy noted. The more distal colon is decompressed, with scattered diverticulosis along the transverse, descending and proximal sigmoid colon. There is no evidence of diverticulitis. Vascular/Lymphatic: Scattered calcification is seen along the abdominal aorta and its branches, including at the proximal renal arteries bilaterally. The  abdominal aorta is otherwise grossly unremarkable. The inferior vena cava is grossly unremarkable. No retroperitoneal lymphadenopathy is seen. No pelvic sidewall lymphadenopathy is identified. Reproductive: The bladder is decompressed, with a Foley catheter in place, and a small amount of contrast in the bladder. The uterus is grossly unremarkable. The ovaries are relatively symmetric. No suspicious adnexal masses are seen. Other: Diffuse soft-tissue phlegmon is noted at the right hip, with remodeling of the anterior right femur and acetabulum, and of the posterior sacrum and coccyx. Findings are compatible with chronic osteomyelitis and large sacral decubitus ulcerations. Mild phlegmon is noted at the left hip. Vague fluid is seen tracking along the musculature of the proximal right thigh. Presacral stranding is noted. Musculoskeletal: No acute osseous abnormalities are identified. The patient is status post thoracic spinal fusion. The visualized musculature is unremarkable in appearance. IMPRESSION: 1. Diffuse soft tissue phlegmon at the right hip. Remodeling of the anterior right femur and acetabulum, and at the posterior sacrum and coccyx, compatible with chronic osteomyelitis, due to underlying large sacral decubitus ulcerations. Mild phlegmon at the left hip. Vague fluid tracks along the musculature of the proximal right thigh, without focal abscess. Presacral stranding noted. 2. Trace bilateral pleural effusions. Partial consolidation of the left lower lobe, which could reflect pneumonia. 3. Dilatation of the common bile duct to 8-9 mm in diameter, and distention of the pancreatic duct to 5 mm, raising concern for distal obstruction. MRCP or ERCP could be considered for further evaluation, as deemed clinically appropriate. 4. Cholelithiasis. 5. Cardiomegaly.  Scattered coronary artery calcifications noted. 6. 6 mm nonspecific hypodensity within the left hepatic lobe. 7. Scattered diverticulosis along the  cecum, and ascending, transverse, descending and proximal sigmoid colon. Right upper quadrant colostomy is unremarkable in appearance. No evidence of diverticulitis. 8. Scattered aortic atherosclerosis. Calcification at the proximal renal arteries bilaterally. Electronically Signed   By: Garald Balding M.D.   On: 08/15/2016 02:12   Mr Sacrum Si Joints W Wo Contrast  Result Date: 08/16/2016 CLINICAL DATA:  55 year old with fever, right sacral decubitus ulcer and extensive chronic inflammatory changes around the right hip on CT. Previous left above the knee amputation. EXAM: MRI OF THE RIGHT HIP WITHOUT AND WITH CONTRAST MR SACRUM AND SACROILIAC JOINTS WITHOUT AND WITH CONTRAST TECHNIQUE: Multiplanar, multisequence MR imaging was performed both before and after administration of intravenous contrast. CONTRAST:  10 ml MultiHance. COMPARISON:  CT 08/15/2016. FINDINGS: Bones/Joint/Cartilage : As correlated with CT, there is diffuse destruction, remodeling and sclerosis of the lower sacrum and coccyx, most consistent with chronic osteomyelitis. The sacroiliac joints and symphysis pubis are intact. Both hips are located. There is extensive heterotopic ossification surrounding the right hip joint. There is an associated complex right hip joint effusion with a large peripherally enhancing fluid collection within the right  adductor musculature. This fluid collection is best seen on the postcontrast images (series 37 through 42). It measures up to 3.9 x 12.7 x 3.2 cm. No other focal fluid collections are seen. There is some marrow edema and enhancement posteriorly in both iliac bones. No evidence of proximal femoral osteomyelitis. Ligaments Not relevant for exam/indication. Muscles and Tendons There is asymmetric T2 hyperintensity and enhancement throughout the right pelvic and proximal thigh musculature. As above, there is a large fluid collection within the right hip adductor musculature. The iliopsoas and hamstring  tendons appear grossly intact. Soft tissues There is decubitus soft tissue ulceration posteriorly over the sacrum and right hip joint. There is diffuse edema and enhancement throughout the adjacent soft tissues, but no other focal fluid collections. Foley catheter and a small amount of free pelvic fluid noted. IMPRESSION: 1. Chronic destruction and remodeling of the lower sacrum and coccyx consistent with chronic osteomyelitis. There is marrow edema and enhancement posteriorly in both iliac bones, suspicious for osteomyelitis. 2. Extensive heterotopic ossification and synovitis surrounding the right hip with large peripherally enhancing fluid collection in the right hip adductor musculature, worrisome for soft tissue abscess. No specific evidence of active osteomyelitis at the right hip. 3. Multifocal decubitus ulceration in the pelvis with associated generalized soft tissue inflammatory changes. Electronically Signed   By: Richardean Sale M.D.   On: 08/16/2016 11:31   Mr Abdomen Mrcp Wo Contrast  Result Date: 08/22/2016 CLINICAL DATA:  "Bile duct abnormality." CVA with hemi paresis. PEG tube. Urinary tract infection. CT demonstrating mild common duct and pancreatic duct dilatation. EXAM: MRI ABDOMEN WITHOUT CONTRAST  (INCLUDING MRCP) TECHNIQUE: Multiplanar multisequence MR imaging of the abdomen was performed. Heavily T2-weighted images of the biliary and pancreatic ducts were obtained, and three-dimensional MRCP images were rendered by post processing. COMPARISON:  08/15/2016 CT. FINDINGS: Moderate-to-marked motion degradation throughout. Lower chest: Cardiomegaly. Trace bilateral pleural fluid may be physiologic. Hepatobiliary: Hepatomegaly at 20.3 cm craniocaudal. 6 mm left hepatic lobe cyst. Normal gallbladder. No intrahepatic biliary duct dilatation. Common duct mildly dilated for age, 9 mm on image 21/ series 3. Upper normal in this age group 627 mm. No choledocholithiasis or obstructive mass. Pancreas:  Normal appearance of the pancreas. No residual pancreatic duct dilatation identified. No evidence of acute pancreatitis. Spleen:  Normal in size, without focal abnormality. Adrenals/Urinary Tract: Mild left adrenal thickening/nodularity. Normal right adrenal gland. Bilateral renal cysts. No hydronephrosis. Stomach/Bowel: Normal stomach and abdominal small bowel. PEG tube. Right upper quadrant colostomy. Vascular/Lymphatic: Normal caliber of the aorta and branch vessels. No abdominal adenopathy. Other:  No ascites.  Diffuse anasarca. Musculoskeletal: Convex left lumbar spine curvature with multilevel degenerative disc disease. IMPRESSION: 1. Moderate to markedly motion degraded exam. 2. Mild common duct dilatation, without obstructive stone or mass. Especially given the normal bilirubin level on 08/14/2016, favored to be within normal variation. 3. Anasarca. Electronically Signed   By: Abigail Miyamoto M.D.   On: 08/22/2016 08:20   Ct Aspiration  Result Date: 08/20/2016 CLINICAL DATA:  55 year old with fever, right sacral decubitus ulcer and extensive chronic inflammatory changes around the right hip on CT. Previous left above the knee amputation.  Recent MR demonstrates Chronic destruction and remodeling of the lower sacrum and coccyxconsistent with chronic osteomyelitis. There is marrow edema andenhancement posteriorly in both iliac bones, suspicious forosteomyelitis. Extensive heterotopic ossification and synovitis surrounding theright hip with large peripherally enhancing fluid collection in theright hip adductor musculature, worrisome for soft tissue abscess.No specific evidence of active osteomyelitis at the  right hip. Multifocal decubitus ulceration in the pelvis with associatedgeneralized soft tissue inflammatory changes. EXAM: CT GUIDED ASPIRATION BIOPSY OF RIGHT THIGH ANESTHESIA/SEDATION: Intravenous Fentanyl and Versed were administered as conscious sedation during continuous monitoring of the patient's  level of consciousness and physiological / cardiorespiratory status by the radiology RN, with a total moderate sedation time of 20 minutes. PROCEDURE: The procedure risks, benefits, and alternatives were explained to the patient. Questions regarding the procedure were encouraged and answered. The patient understands and consents to the procedure. select axial scans through the right hip and upper thigh were obtained. The adductor musculature collection was localized based on recent MR imaging. An appropriate skin entry site was determined and marked. The operative field was prepped with chlorhexidinein a sterile fashion, and a sterile drape was applied covering the operative field. A sterile gown and sterile gloves were used for the procedure. Local anesthesia was provided with 1% Lidocaine. Under CT fluoroscopic guidance, an 18 gauge trocar needle was advanced into the collection. A scant amount of bloody fluid was aspirated, sent for Gram stain and culture. Postprocedure scans show no hemorrhage or other apparent complication. The patient tolerated the procedure well. COMPLICATIONS: None immediate FINDINGS: Poorly marginated low attenuation in the right adductor musculature corresponding to fluid signal process noted on reviewed previous MRI. CT-guided aspiration returned only a scant amount of bloody fluid, sent for Gram stain and culture. IMPRESSION: 1. Technically successful CT-guided aspiration of right medial thigh fluid collection, returning scant blood, sent for Gram stain and culture. Electronically Signed   By: Lucrezia Europe M.D.   On: 08/20/2016 10:24   Mr 3d Recon At Scanner  Result Date: 08/22/2016 CLINICAL DATA:  "Bile duct abnormality." CVA with hemi paresis. PEG tube. Urinary tract infection. CT demonstrating mild common duct and pancreatic duct dilatation. EXAM: MRI ABDOMEN WITHOUT CONTRAST  (INCLUDING MRCP) TECHNIQUE: Multiplanar multisequence MR imaging of the abdomen was performed. Heavily  T2-weighted images of the biliary and pancreatic ducts were obtained, and three-dimensional MRCP images were rendered by post processing. COMPARISON:  08/15/2016 CT. FINDINGS: Moderate-to-marked motion degradation throughout. Lower chest: Cardiomegaly. Trace bilateral pleural fluid may be physiologic. Hepatobiliary: Hepatomegaly at 20.3 cm craniocaudal. 6 mm left hepatic lobe cyst. Normal gallbladder. No intrahepatic biliary duct dilatation. Common duct mildly dilated for age, 9 mm on image 21/ series 3. Upper normal in this age group 627 mm. No choledocholithiasis or obstructive mass. Pancreas: Normal appearance of the pancreas. No residual pancreatic duct dilatation identified. No evidence of acute pancreatitis. Spleen:  Normal in size, without focal abnormality. Adrenals/Urinary Tract: Mild left adrenal thickening/nodularity. Normal right adrenal gland. Bilateral renal cysts. No hydronephrosis. Stomach/Bowel: Normal stomach and abdominal small bowel. PEG tube. Right upper quadrant colostomy. Vascular/Lymphatic: Normal caliber of the aorta and branch vessels. No abdominal adenopathy. Other:  No ascites.  Diffuse anasarca. Musculoskeletal: Convex left lumbar spine curvature with multilevel degenerative disc disease. IMPRESSION: 1. Moderate to markedly motion degraded exam. 2. Mild common duct dilatation, without obstructive stone or mass. Especially given the normal bilirubin level on 08/14/2016, favored to be within normal variation. 3. Anasarca. Electronically Signed   By: Abigail Miyamoto M.D.   On: 08/22/2016 08:20   Dg Chest Port 1 View  Result Date: 08/08/2016 CLINICAL DATA:  Sepsis. Altered mental status and lethargy tonight. History of strokes. EXAM: PORTABLE CHEST 1 VIEW COMPARISON:  05/16/2016 FINDINGS: Postoperative changes in the thoracic spine. Shallow inspiration. Cardiac enlargement. Mild pulmonary vascular congestion. Linear atelectasis in the mid lungs bilaterally. No edema  or consolidation.  Costophrenic angles are obscured and can't exclude small pleural effusions. No pneumothorax. Calcified aorta. Old right rib fractures. IMPRESSION: Cardiac enlargement with pulmonary vascular congestion. No edema or consolidation. Linear atelectasis in the mid lungs. Electronically Signed   By: Lucienne Capers M.D.   On: 08/08/2016 00:52        Discharge Exam: Vitals:   08/22/16 1000 08/22/16 1218  BP: 122/75 118/79  Pulse: 92 81  Resp:  18  Temp:  98.1 F (36.7 C)   Vitals:   08/21/16 2218 08/22/16 0531 08/22/16 1000 08/22/16 1218  BP:  135/62 122/75 118/79  Pulse: 98 86 92 81  Resp:  18  18  Temp:  98.7 F (37.1 C)  98.1 F (36.7 C)  TempSrc:  Oral  Oral  SpO2:  100%  100%  Weight:        General: Pt is alert, awake, not in acute distress Cardiovascular: RRR, S1/S2 +, no rubs, no gallops Respiratory: CTA bilaterally, no wheezing, no rhonchi Abdominal: Soft, NT, ND, bowel sounds + Extremities: no edema, no cyanosis   The results of significant diagnostics from this hospitalization (including imaging, microbiology, ancillary and laboratory) are listed below for reference.    Significant Diagnostic Studies: Mr Hip Right W Wo Contrast  Result Date: 08/16/2016 CLINICAL DATA:  55 year old with fever, right sacral decubitus ulcer and extensive chronic inflammatory changes around the right hip on CT. Previous left above the knee amputation. EXAM: MRI OF THE RIGHT HIP WITHOUT AND WITH CONTRAST MR SACRUM AND SACROILIAC JOINTS WITHOUT AND WITH CONTRAST TECHNIQUE: Multiplanar, multisequence MR imaging was performed both before and after administration of intravenous contrast. CONTRAST:  10 ml MultiHance. COMPARISON:  CT 08/15/2016. FINDINGS: Bones/Joint/Cartilage : As correlated with CT, there is diffuse destruction, remodeling and sclerosis of the lower sacrum and coccyx, most consistent with chronic osteomyelitis. The sacroiliac joints and symphysis pubis are intact. Both hips are  located. There is extensive heterotopic ossification surrounding the right hip joint. There is an associated complex right hip joint effusion with a large peripherally enhancing fluid collection within the right adductor musculature. This fluid collection is best seen on the postcontrast images (series 37 through 42). It measures up to 3.9 x 12.7 x 3.2 cm. No other focal fluid collections are seen. There is some marrow edema and enhancement posteriorly in both iliac bones. No evidence of proximal femoral osteomyelitis. Ligaments Not relevant for exam/indication. Muscles and Tendons There is asymmetric T2 hyperintensity and enhancement throughout the right pelvic and proximal thigh musculature. As above, there is a large fluid collection within the right hip adductor musculature. The iliopsoas and hamstring tendons appear grossly intact. Soft tissues There is decubitus soft tissue ulceration posteriorly over the sacrum and right hip joint. There is diffuse edema and enhancement throughout the adjacent soft tissues, but no other focal fluid collections. Foley catheter and a small amount of free pelvic fluid noted. IMPRESSION: 1. Chronic destruction and remodeling of the lower sacrum and coccyx consistent with chronic osteomyelitis. There is marrow edema and enhancement posteriorly in both iliac bones, suspicious for osteomyelitis. 2. Extensive heterotopic ossification and synovitis surrounding the right hip with large peripherally enhancing fluid collection in the right hip adductor musculature, worrisome for soft tissue abscess. No specific evidence of active osteomyelitis at the right hip. 3. Multifocal decubitus ulceration in the pelvis with associated generalized soft tissue inflammatory changes. Electronically Signed   By: Richardean Sale M.D.   On: 08/16/2016 11:31   Ct Abdomen  Pelvis W Contrast  Result Date: 08/15/2016 CLINICAL DATA:  Acute onset of fever of unknown origin. Initial encounter. EXAM: CT  ABDOMEN AND PELVIS WITH CONTRAST TECHNIQUE: Multidetector CT imaging of the abdomen and pelvis was performed using the standard protocol following bolus administration of intravenous contrast. CONTRAST:  100 mL ISOVUE-300 IOPAMIDOL (ISOVUE-300) INJECTION 61% COMPARISON:  CT of the abdomen and pelvis performed 03/10/2016 FINDINGS: Lower chest: Trace bilateral pleural effusions are noted. There is partial consolidation of the left lower lobe. The heart is enlarged. Scattered coronary artery calcification is noted. Hepatobiliary: A 6 mm hypodensity is noted within the left hepatic lobe. A calcified granuloma is noted within the liver. Small stones are seen dependently within the gallbladder. There is distention of the common bile duct to 8-9 mm in diameter. Pancreas: The pancreatic duct is dilated to 5 mm in diameter, raising concern for distal obstruction. Spleen: The spleen is unremarkable in appearance. Adrenals/Urinary Tract: The adrenal glands are unremarkable in appearance. Small bilateral renal cysts are noted. Nonspecific left-sided perinephric stranding is seen. There is no evidence of hydronephrosis. No renal or ureteral stones are identified, though evaluation for renal stones is limited given contrast in the renal calyces. Stomach/Bowel: The stomach is decompressed. A G-tube is noted ending at the body of the stomach. The small bowel is decompressed and grossly unremarkable. The appendix is normal in caliber, without evidence of appendicitis. Scattered diverticulosis is noted along the cecum and ascending colon, with a right upper quadrant colostomy noted. The more distal colon is decompressed, with scattered diverticulosis along the transverse, descending and proximal sigmoid colon. There is no evidence of diverticulitis. Vascular/Lymphatic: Scattered calcification is seen along the abdominal aorta and its branches, including at the proximal renal arteries bilaterally. The abdominal aorta is otherwise  grossly unremarkable. The inferior vena cava is grossly unremarkable. No retroperitoneal lymphadenopathy is seen. No pelvic sidewall lymphadenopathy is identified. Reproductive: The bladder is decompressed, with a Foley catheter in place, and a small amount of contrast in the bladder. The uterus is grossly unremarkable. The ovaries are relatively symmetric. No suspicious adnexal masses are seen. Other: Diffuse soft-tissue phlegmon is noted at the right hip, with remodeling of the anterior right femur and acetabulum, and of the posterior sacrum and coccyx. Findings are compatible with chronic osteomyelitis and large sacral decubitus ulcerations. Mild phlegmon is noted at the left hip. Vague fluid is seen tracking along the musculature of the proximal right thigh. Presacral stranding is noted. Musculoskeletal: No acute osseous abnormalities are identified. The patient is status post thoracic spinal fusion. The visualized musculature is unremarkable in appearance. IMPRESSION: 1. Diffuse soft tissue phlegmon at the right hip. Remodeling of the anterior right femur and acetabulum, and at the posterior sacrum and coccyx, compatible with chronic osteomyelitis, due to underlying large sacral decubitus ulcerations. Mild phlegmon at the left hip. Vague fluid tracks along the musculature of the proximal right thigh, without focal abscess. Presacral stranding noted. 2. Trace bilateral pleural effusions. Partial consolidation of the left lower lobe, which could reflect pneumonia. 3. Dilatation of the common bile duct to 8-9 mm in diameter, and distention of the pancreatic duct to 5 mm, raising concern for distal obstruction. MRCP or ERCP could be considered for further evaluation, as deemed clinically appropriate. 4. Cholelithiasis. 5. Cardiomegaly.  Scattered coronary artery calcifications noted. 6. 6 mm nonspecific hypodensity within the left hepatic lobe. 7. Scattered diverticulosis along the cecum, and ascending, transverse,  descending and proximal sigmoid colon. Right upper quadrant colostomy is unremarkable  in appearance. No evidence of diverticulitis. 8. Scattered aortic atherosclerosis. Calcification at the proximal renal arteries bilaterally. Electronically Signed   By: Garald Balding M.D.   On: 08/15/2016 02:12   Mr Sacrum Si Joints W Wo Contrast  Result Date: 08/16/2016 CLINICAL DATA:  55 year old with fever, right sacral decubitus ulcer and extensive chronic inflammatory changes around the right hip on CT. Previous left above the knee amputation. EXAM: MRI OF THE RIGHT HIP WITHOUT AND WITH CONTRAST MR SACRUM AND SACROILIAC JOINTS WITHOUT AND WITH CONTRAST TECHNIQUE: Multiplanar, multisequence MR imaging was performed both before and after administration of intravenous contrast. CONTRAST:  10 ml MultiHance. COMPARISON:  CT 08/15/2016. FINDINGS: Bones/Joint/Cartilage : As correlated with CT, there is diffuse destruction, remodeling and sclerosis of the lower sacrum and coccyx, most consistent with chronic osteomyelitis. The sacroiliac joints and symphysis pubis are intact. Both hips are located. There is extensive heterotopic ossification surrounding the right hip joint. There is an associated complex right hip joint effusion with a large peripherally enhancing fluid collection within the right adductor musculature. This fluid collection is best seen on the postcontrast images (series 37 through 42). It measures up to 3.9 x 12.7 x 3.2 cm. No other focal fluid collections are seen. There is some marrow edema and enhancement posteriorly in both iliac bones. No evidence of proximal femoral osteomyelitis. Ligaments Not relevant for exam/indication. Muscles and Tendons There is asymmetric T2 hyperintensity and enhancement throughout the right pelvic and proximal thigh musculature. As above, there is a large fluid collection within the right hip adductor musculature. The iliopsoas and hamstring tendons appear grossly intact. Soft  tissues There is decubitus soft tissue ulceration posteriorly over the sacrum and right hip joint. There is diffuse edema and enhancement throughout the adjacent soft tissues, but no other focal fluid collections. Foley catheter and a small amount of free pelvic fluid noted. IMPRESSION: 1. Chronic destruction and remodeling of the lower sacrum and coccyx consistent with chronic osteomyelitis. There is marrow edema and enhancement posteriorly in both iliac bones, suspicious for osteomyelitis. 2. Extensive heterotopic ossification and synovitis surrounding the right hip with large peripherally enhancing fluid collection in the right hip adductor musculature, worrisome for soft tissue abscess. No specific evidence of active osteomyelitis at the right hip. 3. Multifocal decubitus ulceration in the pelvis with associated generalized soft tissue inflammatory changes. Electronically Signed   By: Richardean Sale M.D.   On: 08/16/2016 11:31   Mr Abdomen Mrcp Wo Contrast  Result Date: 08/22/2016 CLINICAL DATA:  "Bile duct abnormality." CVA with hemi paresis. PEG tube. Urinary tract infection. CT demonstrating mild common duct and pancreatic duct dilatation. EXAM: MRI ABDOMEN WITHOUT CONTRAST  (INCLUDING MRCP) TECHNIQUE: Multiplanar multisequence MR imaging of the abdomen was performed. Heavily T2-weighted images of the biliary and pancreatic ducts were obtained, and three-dimensional MRCP images were rendered by post processing. COMPARISON:  08/15/2016 CT. FINDINGS: Moderate-to-marked motion degradation throughout. Lower chest: Cardiomegaly. Trace bilateral pleural fluid may be physiologic. Hepatobiliary: Hepatomegaly at 20.3 cm craniocaudal. 6 mm left hepatic lobe cyst. Normal gallbladder. No intrahepatic biliary duct dilatation. Common duct mildly dilated for age, 9 mm on image 21/ series 3. Upper normal in this age group 627 mm. No choledocholithiasis or obstructive mass. Pancreas: Normal appearance of the pancreas. No  residual pancreatic duct dilatation identified. No evidence of acute pancreatitis. Spleen:  Normal in size, without focal abnormality. Adrenals/Urinary Tract: Mild left adrenal thickening/nodularity. Normal right adrenal gland. Bilateral renal cysts. No hydronephrosis. Stomach/Bowel: Normal stomach and abdominal small  bowel. PEG tube. Right upper quadrant colostomy. Vascular/Lymphatic: Normal caliber of the aorta and branch vessels. No abdominal adenopathy. Other:  No ascites.  Diffuse anasarca. Musculoskeletal: Convex left lumbar spine curvature with multilevel degenerative disc disease. IMPRESSION: 1. Moderate to markedly motion degraded exam. 2. Mild common duct dilatation, without obstructive stone or mass. Especially given the normal bilirubin level on 08/14/2016, favored to be within normal variation. 3. Anasarca. Electronically Signed   By: Abigail Miyamoto M.D.   On: 08/22/2016 08:20   Ct Aspiration  Result Date: 08/20/2016 CLINICAL DATA:  55 year old with fever, right sacral decubitus ulcer and extensive chronic inflammatory changes around the right hip on CT. Previous left above the knee amputation.  Recent MR demonstrates Chronic destruction and remodeling of the lower sacrum and coccyxconsistent with chronic osteomyelitis. There is marrow edema andenhancement posteriorly in both iliac bones, suspicious forosteomyelitis. Extensive heterotopic ossification and synovitis surrounding theright hip with large peripherally enhancing fluid collection in theright hip adductor musculature, worrisome for soft tissue abscess.No specific evidence of active osteomyelitis at the right hip. Multifocal decubitus ulceration in the pelvis with associatedgeneralized soft tissue inflammatory changes. EXAM: CT GUIDED ASPIRATION BIOPSY OF RIGHT THIGH ANESTHESIA/SEDATION: Intravenous Fentanyl and Versed were administered as conscious sedation during continuous monitoring of the patient's level of consciousness and physiological  / cardiorespiratory status by the radiology RN, with a total moderate sedation time of 20 minutes. PROCEDURE: The procedure risks, benefits, and alternatives were explained to the patient. Questions regarding the procedure were encouraged and answered. The patient understands and consents to the procedure. select axial scans through the right hip and upper thigh were obtained. The adductor musculature collection was localized based on recent MR imaging. An appropriate skin entry site was determined and marked. The operative field was prepped with chlorhexidinein a sterile fashion, and a sterile drape was applied covering the operative field. A sterile gown and sterile gloves were used for the procedure. Local anesthesia was provided with 1% Lidocaine. Under CT fluoroscopic guidance, an 18 gauge trocar needle was advanced into the collection. A scant amount of bloody fluid was aspirated, sent for Gram stain and culture. Postprocedure scans show no hemorrhage or other apparent complication. The patient tolerated the procedure well. COMPLICATIONS: None immediate FINDINGS: Poorly marginated low attenuation in the right adductor musculature corresponding to fluid signal process noted on reviewed previous MRI. CT-guided aspiration returned only a scant amount of bloody fluid, sent for Gram stain and culture. IMPRESSION: 1. Technically successful CT-guided aspiration of right medial thigh fluid collection, returning scant blood, sent for Gram stain and culture. Electronically Signed   By: Lucrezia Europe M.D.   On: 08/20/2016 10:24   Mr 3d Recon At Scanner  Result Date: 08/22/2016 CLINICAL DATA:  "Bile duct abnormality." CVA with hemi paresis. PEG tube. Urinary tract infection. CT demonstrating mild common duct and pancreatic duct dilatation. EXAM: MRI ABDOMEN WITHOUT CONTRAST  (INCLUDING MRCP) TECHNIQUE: Multiplanar multisequence MR imaging of the abdomen was performed. Heavily T2-weighted images of the biliary and  pancreatic ducts were obtained, and three-dimensional MRCP images were rendered by post processing. COMPARISON:  08/15/2016 CT. FINDINGS: Moderate-to-marked motion degradation throughout. Lower chest: Cardiomegaly. Trace bilateral pleural fluid may be physiologic. Hepatobiliary: Hepatomegaly at 20.3 cm craniocaudal. 6 mm left hepatic lobe cyst. Normal gallbladder. No intrahepatic biliary duct dilatation. Common duct mildly dilated for age, 9 mm on image 21/ series 3. Upper normal in this age group 627 mm. No choledocholithiasis or obstructive mass. Pancreas: Normal appearance of the pancreas. No  residual pancreatic duct dilatation identified. No evidence of acute pancreatitis. Spleen:  Normal in size, without focal abnormality. Adrenals/Urinary Tract: Mild left adrenal thickening/nodularity. Normal right adrenal gland. Bilateral renal cysts. No hydronephrosis. Stomach/Bowel: Normal stomach and abdominal small bowel. PEG tube. Right upper quadrant colostomy. Vascular/Lymphatic: Normal caliber of the aorta and branch vessels. No abdominal adenopathy. Other:  No ascites.  Diffuse anasarca. Musculoskeletal: Convex left lumbar spine curvature with multilevel degenerative disc disease. IMPRESSION: 1. Moderate to markedly motion degraded exam. 2. Mild common duct dilatation, without obstructive stone or mass. Especially given the normal bilirubin level on 08/14/2016, favored to be within normal variation. 3. Anasarca. Electronically Signed   By: Abigail Miyamoto M.D.   On: 08/22/2016 08:20   Dg Chest Port 1 View  Result Date: 08/08/2016 CLINICAL DATA:  Sepsis. Altered mental status and lethargy tonight. History of strokes. EXAM: PORTABLE CHEST 1 VIEW COMPARISON:  05/16/2016 FINDINGS: Postoperative changes in the thoracic spine. Shallow inspiration. Cardiac enlargement. Mild pulmonary vascular congestion. Linear atelectasis in the mid lungs bilaterally. No edema or consolidation. Costophrenic angles are obscured and can't  exclude small pleural effusions. No pneumothorax. Calcified aorta. Old right rib fractures. IMPRESSION: Cardiac enlargement with pulmonary vascular congestion. No edema or consolidation. Linear atelectasis in the mid lungs. Electronically Signed   By: Lucienne Capers M.D.   On: 08/08/2016 00:52     Microbiology: Recent Results (from the past 240 hour(s))  Culture, blood (routine x 2)     Status: None   Collection Time: 08/14/16  8:02 PM  Result Value Ref Range Status   Specimen Description BLOOD RIGHT HAND  Final   Special Requests IN PEDIATRIC BOTTLE 3CC  Final   Culture NO GROWTH 5 DAYS  Final   Report Status 08/19/2016 FINAL  Final  Culture, blood (routine x 2)     Status: None   Collection Time: 08/14/16  8:02 PM  Result Value Ref Range Status   Specimen Description BLOOD RIGHT HAND  Final   Special Requests IN PEDIATRIC BOTTLE 3CC  Final   Culture NO GROWTH 5 DAYS  Final   Report Status 08/19/2016 FINAL  Final  Aerobic/Anaerobic Culture (surgical/deep wound)     Status: None (Preliminary result)   Collection Time: 08/19/16  9:41 PM  Result Value Ref Range Status   Specimen Description WOUND RIGHT THIGH  Final   Special Requests Normal  Final   Gram Stain   Final    FEW WBC PRESENT,BOTH PMN AND MONONUCLEAR NO ORGANISMS SEEN    Culture NO GROWTH 3 DAYS  Final   Report Status PENDING  Incomplete     Labs: Basic Metabolic Panel:  Recent Labs Lab 08/19/16 0535 08/21/16 0612  NA 135 136  K 4.7 4.1  CL 103 101  CO2 24 29  GLUCOSE 97 130*  BUN 18 15  CREATININE <0.30* <0.30*  CALCIUM 8.6* 8.4*   Liver Function Tests: No results for input(s): AST, ALT, ALKPHOS, BILITOT, PROT, ALBUMIN in the last 168 hours. No results for input(s): LIPASE, AMYLASE in the last 168 hours. No results for input(s): AMMONIA in the last 168 hours. CBC:  Recent Labs Lab 08/17/16 0439 08/18/16 0458 08/19/16 0535 08/20/16 0821 08/21/16 0612  WBC 9.7 9.7 9.7 9.2 10.4  HGB 8.9* 9.4*  8.8* 8.6* 8.8*  HCT 30.0* 31.5* 29.3* 28.1* 28.9*  MCV 98.0 98.1 97.0 95.3 94.8  PLT 369 397 496* 553* 583*   Cardiac Enzymes: No results for input(s): CKTOTAL, CKMB, CKMBINDEX, TROPONINI in  the last 168 hours. BNP: Invalid input(s): POCBNP CBG:  Recent Labs Lab 08/21/16 0742 08/21/16 1116 08/21/16 1603 08/22/16 0004 08/22/16 0739  GLUCAP 116* 138* 95 125* 115*    Time coordinating discharge:  Greater than 30 minutes  Signed:  Keifer Habib, DO [facility] Pager: [phone] 08/22/2016, 1:38 PM  \

## 2016-08-22 NOTE — Progress Notes (Signed)
Peripherally Inserted Central Catheter/Midline Placement  The IV Nurse has discussed with the patient and/or persons authorized to consent for the patient, the purpose of this procedure and the potential benefits and risks involved with this procedure.  The benefits include less needle sticks, lab draws from the catheter, and the patient may be discharged home with the catheter. Risks include, but not limited to, infection, bleeding, blood clot (thrombus formation), and puncture of an artery; nerve damage and irregular heartbeat and possibility to perform a PICC exchange if needed/ordered by physician.  Alternatives to this procedure were also discussed.  Bard Power PICC patient education guide, fact sheet on infection prevention and patient information card has been provided to patient /or left at bedside.    PICC/Midline Placement Documentation     Telephone consent signed by daughter   Katherine Palmer, Katherine Palmer 08/22/2016, 10:24 AM

## 2016-08-22 NOTE — Progress Notes (Signed)
Patient will DC to: [facility] Anticipated DC date: 08/22/16 Family notified: Son Transport by: Sharin MonsPTAR 3:30pm   Per MD patient ready for DC to [facility]. RN, patient, patient's family, and facility notified of DC. Discharge Summary sent to facility. RN given number for report. DC packet on chart. Ambulance transport requested for patient.   CSW signing off.  Cristobal GoldmannNadia Akshith Moncus, [place] Clinical Social Worker [phone]

## 2016-08-22 NOTE — Progress Notes (Signed)
NURSING PROGRESS NOTE  Katherine Palmer [id number] Discharge Data: 08/22/2016 3:24 PM Attending Provider: Orson Eva, MD HUT:[id number], [name], DO     Georgiann Hahn to be D/C'd Skilled nursing facility per MD order.  Discussed with the patient the After Visit Summary and all questions fully answered. All IV's discontinued with no bleeding noted. All belongings returned to patient for patient to take to SNF. Report given to receiving nurse, Zigmund Daniel. All questions answered. Pt will be transferred via PTAR.   Last Vital Signs:  Blood pressure 118/79, pulse 81, temperature 98.1 F (36.7 C), temperature source Oral, resp. rate 18, weight 47.6 kg (104 lb 15 oz), SpO2 100 %.  Discharge Medication List   Medication List    STOP taking these medications   cloNIDine 0.1 MG tablet Commonly known as:  CATAPRES   UNABLE TO FIND     TAKE these medications   acetaminophen 325 MG tablet Commonly known as:  TYLENOL Place 650 mg into feeding tube every 4 (four) hours as needed for mild pain.   albuterol (2.5 MG/3ML) 0.083% nebulizer solution Commonly known as:  PROVENTIL Take 2.5 mg by nebulization every 4 (four) hours as needed for wheezing or shortness of breath.   ascorbic acid 500 MG tablet Commonly known as:  VITAMIN C Take 500 mg by mouth daily.   aspirin EC 81 MG tablet Take 81 mg by mouth daily. Via G-Tube   bisacodyl 10 MG suppository Commonly known as:  DULCOLAX Place 10 mg rectally daily as needed for moderate constipation.   collagenase ointment Commonly known as:  SANTYL Apply 1 application topically daily.   DECUBI-VITE Caps Take 1 capsule by mouth daily.   DEPAKENE PO Give 200 mg by tube every 8 (eight) hours.   escitalopram 5 MG tablet Commonly known as:  LEXAPRO Place 10 mg into feeding tube daily.   feeding supplement (OSMOLITE 1.5 CAL) Liqd Place 1,000 mLs into feeding tube continuous.   feeding supplement (PRO-STAT SUGAR FREE 64) Liqd Place 30 mLs into feeding tube  3 (three) times daily with meals.   FOLBECAL 1 MG Tabs Give 1 tablet by tube daily.   free water Soln Place 200 mLs into feeding tube every 8 (eight) hours.   ipratropium-albuterol 0.5-2.5 (3) MG/3ML Soln Commonly known as:  DUONEB Take 3 mLs by nebulization every 6 (six) hours as needed (sob).   LORazepam 0.5 MG tablet Commonly known as:  ATIVAN Place 1 tablet (0.5 mg total) into feeding tube every 8 (eight) hours as needed for anxiety.   magnesium hydroxide 400 MG/5ML suspension Commonly known as:  MILK OF MAGNESIA Place 30 mLs into feeding tube daily as needed for mild constipation.   meropenem 1 g in sodium chloride 0.9 % 100 mL Inject 1 g into the vein every 12 (twelve) hours. Last dose on 09/20/2016   meropenem IVPB Commonly known as:  MERREM Inject 1 g into the vein every 12 (twelve) hours. Indication: chronic osteomyelitis  Last Day of Therapy: 09/20/16 Labs - Once weekly:  CBC/D and BMP, Labs - Every other week:  ESR and CRP   metoprolol tartrate 25 MG tablet Commonly known as:  LOPRESSOR Take 1 tablet (25 mg total) by mouth 2 (two) times daily. What changed:  how to take this   OxyCODONE HCl (Abuse Deter) 5 MG Taba Commonly known as:  OXAYDO Take 5 mg by mouth every 6 (six) hours as needed (pain).   polyethylene glycol packet Commonly known as:  MIRALAX / GLYCOLAX  Place 17 g into feeding tube daily.   pravastatin 40 MG tablet Commonly known as:  PRAVACHOL Place 40 mg into feeding tube daily.   PROTONIX 40 mg/20 mL Pack Generic drug:  pantoprazole sodium Place 40 mg into feeding tube daily.   vancomycin 1-5 GM/200ML-% Soln Commonly known as:  VANCOCIN Inject 200 mLs (1,000 mg total) into the vein every 8 (eight) hours. Last dose on 09/20/2016   vancomycin IVPB Inject 750 mg into the vein every 12 (twelve) hours. Indication:  osteomyelitis  Last Day of Therapy:  09/20/16 Labs - _0 /07/17 1316       Cyndi Harue Pribble, RN

## 2016-08-24 LAB — AEROBIC/ANAEROBIC CULTURE (SURGICAL/DEEP WOUND)
CULTURE: NO GROWTH
SPECIAL REQUESTS: NORMAL

## 2016-08-26 ENCOUNTER — Encounter: Payer: Self-pay | Admitting: Adult Health

## 2016-08-26 ENCOUNTER — Non-Acute Institutional Stay (SKILLED_NURSING_FACILITY): Payer: Medicaid Other | Admitting: Adult Health

## 2016-08-26 ENCOUNTER — Other Ambulatory Visit: Payer: Self-pay | Admitting: *Deleted

## 2016-08-26 DIAGNOSIS — A419 Sepsis, unspecified organism: Secondary | ICD-10-CM | POA: Diagnosis not present

## 2016-08-26 DIAGNOSIS — D638 Anemia in other chronic diseases classified elsewhere: Secondary | ICD-10-CM

## 2016-08-26 DIAGNOSIS — I10 Essential (primary) hypertension: Secondary | ICD-10-CM

## 2016-08-26 DIAGNOSIS — E43 Unspecified severe protein-calorie malnutrition: Secondary | ICD-10-CM

## 2016-08-26 DIAGNOSIS — M8668 Other chronic osteomyelitis, other site: Secondary | ICD-10-CM

## 2016-08-26 DIAGNOSIS — L8944 Pressure ulcer of contiguous site of back, buttock and hip, stage 4: Secondary | ICD-10-CM

## 2016-08-26 DIAGNOSIS — I69391 Dysphagia following cerebral infarction: Secondary | ICD-10-CM

## 2016-08-26 DIAGNOSIS — R627 Adult failure to thrive: Secondary | ICD-10-CM

## 2016-08-26 DIAGNOSIS — I69354 Hemiplegia and hemiparesis following cerebral infarction affecting left non-dominant side: Secondary | ICD-10-CM

## 2016-08-26 LAB — CBC AND DIFFERENTIAL
HCT: 32 % — AB (ref 36–46)
Hemoglobin: 9.6 g/dL — AB (ref 12.0–16.0)
Platelets: 470 10*3/uL — AB (ref 150–399)
WBC: 10.4 10^3/mL

## 2016-08-26 LAB — BASIC METABOLIC PANEL
BUN: 15 mg/dL (ref 4–21)
CREATININE: 0.5 mg/dL (ref 0.5–1.1)
Glucose: 138 mg/dL
POTASSIUM: 5.4 mmol/L — AB (ref 3.4–5.3)
Sodium: 134 mmol/L — AB (ref 137–147)

## 2016-08-26 MED ORDER — OXYCODONE HCL 5 MG PO TABS: ORAL_TABLET | ORAL | 0 refills | Status: AC

## 2016-08-26 MED ORDER — LORAZEPAM 0.5 MG PO TABS: 0.5000 mg | ORAL_TABLET | Freq: Three times a day (TID) | ORAL | 0 refills | Status: AC | PRN

## 2016-08-26 NOTE — Telephone Encounter (Signed)
[facility] #: [phone] Fax#: [phone]  

## 2016-08-26 NOTE — Progress Notes (Signed)
Patient ID: Katherine Palmer, female   DOB: 1961/08/28, 55 y.o.   MRN: [id number]   Location:   [place] [facility] Room Number: 144-A Place of Service:  SNF (31)   CODE STATUS: Full Code  No Known Allergies   Chief Complaint  Patient presents with  . Hospitalization Follow-up    HPI:  She is a long term resident of this facility who has been hospitalized for sepsis due to esbl e-coli and pseudomonas pyelonephritis.  She has a right hip/adductor abscess and chronic osteomyelitis from a stage IV decubitus ulcer.   Past Medical History:  Diagnosis Date  . Decubitus ulcer of sacral region, stage 4 ([place])   . Depression   . Dysphagia   . Dysphagia as late effect of cerebrovascular disease   . Hyperlipidemia   . Hypertension   . Seizures ([place])   . Stroke [facility])     Past Surgical History:  Procedure Laterality Date  . 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 COLOSTOMY    . PEG TUBE PLACEMENT      Social History   Social History  . Marital status: Divorced    Spouse name: N/A  . Number of children: N/A  . Years of education: N/A   Occupational History  . Not on file.   Social History Main Topics  . Smoking status: Former Research scientist (life sciences)  . Smokeless tobacco: Never Used  . Alcohol use No  . Drug use: No  . Sexual activity: No   Other Topics Concern  . Not on file   Social History Narrative  . No narrative on file   Family History  Problem Relation Age of Onset  . Hypertension Other       VITAL SIGNS BP 130/76   Pulse 88   Temp 97.6 F (36.4 C) (Oral)   Resp 18   Ht 5' 5"  (1.651 m)   Wt 126 lb (57.2 kg)   SpO2 94%   BMI 20.97 kg/m   Patient's Medications  New Prescriptions   No medications on file  Previous Medications   ACETAMINOPHEN (TYLENOL) 325 MG TABLET    Place 650 mg into feeding tube every 4 (four) hours as needed for mild pain.    ALBUTEROL (PROVENTIL) (2.5 MG/3ML) 0.083% NEBULIZER SOLUTION    Take 2.5 mg by nebulization every 4 (four)  hours as needed for wheezing or shortness of breath.   AMINO ACIDS-PROTEIN HYDROLYS (FEEDING SUPPLEMENT, PRO-STAT SUGAR FREE 64,) LIQD    Place 30 mLs into feeding tube 3 (three) times daily with meals.   ASCORBIC ACID (VITAMIN C) 500 MG TABLET    Take 500 mg by mouth daily.   ASPIRIN EC 81 MG TABLET    Take 81 mg by mouth daily. Via G-Tube    BISACODYL (DULCOLAX) 10 MG SUPPOSITORY    Place 10 mg rectally daily as needed for moderate constipation.   COLLAGENASE (SANTYL) OINTMENT    Apply 1 application topically daily.   ESCITALOPRAM (LEXAPRO) 5 MG TABLET    Place 10 mg into feeding tube daily.   HEPARIN FLUSH 10 UNIT/ML SOLN INJECTION    Inject 5 Units into the vein every 12 (twelve) hours as needed.   IPRATROPIUM-ALBUTEROL (DUONEB) 0.5-2.5 (3) MG/3ML SOLN    Take 3 mLs by nebulization every 6 (six) hours as needed (sob).    LANSOPRAZOLE (PREVACID SOLUTAB) 30 MG DISINTEGRATING TABLET    Take 30 mg by mouth daily.   LORAZEPAM (ATIVAN) 0.5 MG TABLET  Place 1 tablet (0.5 mg total) into feeding tube every 8 (eight) hours as needed for anxiety.   MAGNESIUM HYDROXIDE (MILK OF MAGNESIA) 400 MG/5ML SUSPENSION    Place 30 mLs into feeding tube daily as needed for mild constipation.    MEROPENEM (MERREM) IVPB    Inject 1 g into the vein every 12 (twelve) hours. Indication: chronic osteomyelitis  Last Day of Therapy: 09/20/16 Labs - Once weekly:  CBC/D and BMP, Labs - Every other week:  ESR and CRP   METOPROLOL TARTRATE (LOPRESSOR) 25 MG TABLET    Take 1 tablet (25 mg total) by mouth 2 (two) times daily.   MULTIPLE VITAMINS-MINERALS (DECUBI-VITE) CAPS    Take 1 capsule by mouth daily.   NUTRITIONAL SUPPLEMENTS (FEEDING SUPPLEMENT, OSMOLITE 1.5 CAL,) LIQD    Place 1,000 mLs into feeding tube continuous.   OXYCODONE (ROXICODONE) 5 MG IMMEDIATE RELEASE TABLET    Take one tablet by mouth every 6 hours as needed for pain   PANTOPRAZOLE SODIUM (PROTONIX) 40 MG/20 ML PACK    Place 40 mg into feeding tube daily.    POLYETHYLENE GLYCOL (MIRALAX / GLYCOLAX) PACKET    Place 17 g into feeding tube daily.   PRAVASTATIN (PRAVACHOL) 40 MG TABLET    Place 40 mg into feeding tube daily.   PRENATAL CA CARB-B6-B12-FA (FOLBECAL) 1 MG TABS    Give 1 tablet by tube daily.   SODIUM CHLORIDE FLUSH (NORMAL SALINE FLUSH) 0.9 % SOLN    Inject 10 mLs into the vein every 8 (eight) hours.   VALPROATE SODIUM (DEPAKENE) 250 MG/5ML SOLN SOLUTION    Take by mouth. Give 54m via G-Tube every 8 hours   VANCOMYCIN (VANCOCIN) 1-5 GM/200ML-% SOLN    Inject 200 mLs (1,000 mg total) into the vein every 8 (eight) hours. Last dose on 09/20/2016   WATER FOR IRRIGATION, STERILE (FREE WATER) SOLN    Place 200 mLs into feeding tube every 8 (eight) hours.   ZINC 100 MG TABS    Take 100 mg by mouth daily.   Modified Medications   No medications on file  Discontinued Medications     SIGNIFICANT DIAGNOSTIC EXAMS   03-28-16: chest x-ray: 1. Improvement in lung aeration since prior study with a decrease in pleural effusions. There is central vascular congestion but no convincing pulmonary edema.   04-23-16: 2-d echo: 1. technically difficult study with limited views 2. Normal appearing left ventricular systolic function. EF 55-60% 3. Mild sclerotic aortic valve no significant stenosis by velocity 4. Mild concentric left ventricular hypertrophy 5. Moderate mitral regurgitation 6. Mild tricuspid regurgitation 7. Highly mobile interatrial septum with a suggestion of a septal defect. However, the septum and the valvular structures are not well visualized to determine if there is a defect or evidence of vegetations. 8. Recommend transesophageal echocardiogram for better visualization of te left atrial septum in addition to better visualize the valvular structures given the patient's history   05-16-16: chest x-ray; No acute cardiopulmonary disease.  05-16-16: ct of head: 1. Remote large right hemispheric infarction with extensive encephalomalacia and  dystrophic calcifications. 2. No definite acute intracranial process.   05-17-16: TEE: - Left ventricle: Inferior wall hypokinesis The cavity size was normal. Systolic function was normal. The estimated ejection fraction was in the range of 50% to 55%. Wall motion was normal; there were no regional wall motion abnormalities. Left ventricular diastolic function parameters were normal. - Mitral valve: There was mild regurgitation. - Atrial septum: No defect or patent  foramen ovale was identified.  05-28-16: chest x-ray: mild pulmonary venous congestion/chf  08-08-16: chest x-ray: Cardiac enlargement with pulmonary vascular congestion. No edema or consolidation. Linear atelectasis in the mid lungs.  08-15-16: ct of abdomen and pelvis: 1. Diffuse soft tissue phlegmon at the right hip. Remodeling of the anterior right femur and acetabulum, and at the posterior sacrum and coccyx, compatible with chronic osteomyelitis, due to underlying large sacral decubitus ulcerations. Mild phlegmon at the left hip. Vague fluid tracks along the musculature of the proximal right thigh, without focal abscess. Presacral stranding noted. 2. Trace bilateral pleural effusions. Partial consolidation of the left lower lobe, which could reflect pneumonia. 3. Dilatation of the common bile duct to 8-9 mm in diameter, and distention of the pancreatic duct to 5 mm, raising concern for distal obstruction. MRCP or ERCP could be considered for further evaluation, as deemed clinically appropriate. 4. Cholelithiasis. 5. Cardiomegaly.  Scattered coronary artery calcifications noted. 6. 6 mm nonspecific hypodensity within the left hepatic lobe. 7. Scattered diverticulosis along the cecum, and ascending, transverse, descending and proximal sigmoid colon. Right upper quadrant colostomy is unremarkable in appearance. No evidence of diverticulitis. 8. Scattered aortic atherosclerosis. Calcification at the proximal renal arteries  bilaterally.  08-16-16: mri of sacrum and right hip: 1. Chronic destruction and remodeling of the lower sacrum and coccyx consistent with chronic osteomyelitis. There is marrow edema and enhancement posteriorly in both iliac bones, suspicious for osteomyelitis. 2. Extensive heterotopic ossification and synovitis surrounding the right hip with large peripherally enhancing fluid collection in the right hip adductor musculature, worrisome for soft tissue abscess. No specific evidence of active osteomyelitis at the right hip. 3. Multifocal decubitus ulceration in the pelvis with associated generalized soft tissue inflammatory changes.   08-19-16; right thigh aspiration: 1. Technically successful CT-guided aspiration of right medial thigh fluid collection, returning scant blood, sent for Gram stain and culture.  08-22-16; mri of abdomen with mrcp: 1. Moderate to markedly motion degraded exam. 2. Mild common duct dilatation, without obstructive stone or mass. Especially given the normal bilirubin level on 08/14/2016, favored to be within normal variation. 3. Anasarca.    LABS REVIEWED:   04-08-16: wbc 9.7; hgb 8.9; hct 26.5; plt 330; glucose 122; bun 11; creat 0.21; k+ 4.1; na++ 128; liver normal albumin 2.2  04-12-16: wbc 15.0; hgb 8.8; hct 26.6; mcv 94.0; plt 386; glucose 125; bun 11; creat 0.21; k+ 4.0; na++ 132; liver normal albumin 2.0; chol 77; ldl 35; trig 81; hdl 26; iron 10; tibc 23; ferritin 471; depakote 15.4; hgb a1c 5.7; abs neut: 8400; abs mono 3750; abs baso 0  04-21-16: picc line tip culture: MRSA: vancomycin 05-10-16: wbc 13.0; hgb 8.1; hct 26.4; mcv 93.6; plt 555 05-16-16: wbc 16.1; hgb 10.7; hct 37.8; mcv 99.7; plt 478; glucose 128; bun 58; creat 0.58; k+ 4.8; na++ 151; ast 46; albumin 1.9; depakote 16; c-diff: neg; urine culture 20,000 e-coli; blood culture: enterococcus faecalis: ampicillin  05-17-16: wbc 16.1; hgb 7.1; hct 25.1; mcv 98.8; plt 382; glucose 92; bun 39; creat<0.3; k+ 3.3; na++  153; mag 2.0 phos 2.7; hgb a1c 5.1 05-18-16: glucose 88; bun 15; creat <0.3; k+ 3.6; na++ 141 05-19-16: wbc 7.7; hgb 7.1; hct 24.5; mcv 95.0; plt 362; glucose 96; bun 10; creat <0.3; k+ 3.5; na++ 144  05-21-16: wbc 6.5; hgb 8.1; hct 28.0; mcv 92.1; plt 364; glucose 108; bun 8; creat 0.38; k+ 3.2; na++ 142  05-24-16: wbc 9.1; hgb 8.4; hct 27.6; mcv 91.1; plt 475;  glucose 119; bun 11; creat 0.31; k+ 4.8; na++ 139;  07-16-16: wbc 16.5; hgb 7.7; hct 26.3; mcv 97.7; plt 601; glucose 137; bun 61.9; creat 0.40; k+ 6.9; na++ 133; liver normal albumin 2.3  07-24-16: wbc 11.0; hgb 7.5; hct 25.4; mcv 95.5; plt 404;  glucose 113; bun 22.1; creat 0.17; k+ 5.6; na++ 131 mag 1.5  08-02-16; wbc 14.5; hgb 8.6; hct 29.3; mcv 103.2; plt 495; glucose 168; bun 32.0; creat 0.57; k+ 5.5; na++ 138; alk phos 154; albumin 2.1  08-08-16: wbc 14.6; hgb 10.0; hct 35.0; mcv 103.6; plt 304; glucose 90; bun 34; creat 0.35; k+ 4.7; na++ 143; liver normal albumin 1.5; ammonia 26; blood culture: no growth; urine culture: e-coli 08-11-16: wbc 9.6; hgb 10.5; hct 35.7; mcv 100.0; plt 274; glucose 114; bun 19; creat 0.34; k+ 5.5; na++ 142 08-14-16: wbc 9.9; hgb 9.2; hct 31.9; mcv 100.9; plt 303; glucose 131; bun 19; creat 0.36; k+ 4.1; na++ 144; liver; albumin 1.4  08-19-16: wbc 9.7; hgb 8.8; hct 29.3; mcv 97; plt 496; glucose 97; bun 18; creat <0.30; k+ 4.7; na++ 135   Review of Systems  Unable to perform ROS: Other    Physical Exam  Constitutional: No distress.  Frail   Eyes: Conjunctivae are normal.  Neck: Neck supple. No JVD present. No thyromegaly present.  Cardiovascular: Normal rate, regular rhythm and intact distal pulses.   Respiratory: Effort normal. No respiratory distress. She has no wheezes.  Diminished breath sounds   GI: Soft. Bowel sounds are normal. She exhibits no distension. There is no tenderness.  Diverting colostomy   Genitourinary:  Genitourinary Comments: foley  Musculoskeletal: She exhibits no edema.  Status  post cva Is able to move right upper extremity Unable to move left upper extremity limited passive range of motion Right lower extremity contracted Is status post left aka   Lymphadenopathy:    She has no cervical adenopathy.  Neurological: She is alert.  Skin: Skin is warm and dry. She is not diaphoretic.  Sacral stage IV: 10 x 33 x 0.2 cm Mid back: 0.8 x 0.8 x 0.2 cm  Psychiatric: She has a normal mood and affect.    ASSESSMENT/ PLAN:  1. 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   2. 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   3. Stage IV sacral ulceration:has chronic osteomyelitis:  will continue current treatment;  Will continue oxycodone 5 mg every 6 hours as needed   4. Failure to thrive: her current weight is 126 pounds; will continue tube feeding for her nutritional support prostat 30 cc three time daily albumin is 1.4.  Will monitor   5. Depression with anxiety: will continue lexapro 10 mg   daily is taking depakote  200 mg every 8 hours for mood stabilization;  will continue ativan 0.5 mg every 8 hours as needed for anxiety  6.  Hypertension: will continue lopressor 25 mg twice daily   7. Anemia: hgb 8.8 will monitor   8. Constipation: will continue miralax daily   9. Dyslipidemia: ldl 35; will continue pravachol 40 mg daily  10. Gerd: will continue prevacid 30 mg daily and will stop protonix  11. Sepsis due to pyelonephritis: will complete IV vanc and meropenem on 09-20-16; and will monitor her status; will continue blood work per I/D.     Time spent with patient  50   minutes >50% time  spent counseling; reviewing medical record; tests; labs; and developing future plan of care      MD is aware of resident's narcotic use and is in agreement with current plan of care. We will attempt to wean resident as appropriate.      Ok Edwards NP [facility] Adult  Medicine  Contact [phone] Monday through Friday 8am- 5pm  After hours call [phone]

## 2016-08-27 ENCOUNTER — Encounter: Payer: Self-pay | Admitting: Internal Medicine

## 2016-08-27 ENCOUNTER — Non-Acute Institutional Stay (SKILLED_NURSING_FACILITY): Payer: Medicaid Other | Admitting: Internal Medicine

## 2016-08-27 DIAGNOSIS — I69391 Dysphagia following cerebral infarction: Secondary | ICD-10-CM

## 2016-08-27 DIAGNOSIS — R627 Adult failure to thrive: Secondary | ICD-10-CM

## 2016-08-27 DIAGNOSIS — E43 Unspecified severe protein-calorie malnutrition: Secondary | ICD-10-CM | POA: Diagnosis not present

## 2016-08-27 DIAGNOSIS — Z933 Colostomy status: Secondary | ICD-10-CM

## 2016-08-27 DIAGNOSIS — N390 Urinary tract infection, site not specified: Secondary | ICD-10-CM

## 2016-08-27 DIAGNOSIS — T83511D Infection and inflammatory reaction due to indwelling urethral catheter, subsequent encounter: Secondary | ICD-10-CM | POA: Diagnosis not present

## 2016-08-27 DIAGNOSIS — M8668 Other chronic osteomyelitis, other site: Secondary | ICD-10-CM

## 2016-08-27 DIAGNOSIS — Z89612 Acquired absence of left leg above knee: Secondary | ICD-10-CM

## 2016-08-27 DIAGNOSIS — L02415 Cutaneous abscess of right lower limb: Secondary | ICD-10-CM

## 2016-08-27 DIAGNOSIS — L8944 Pressure ulcer of contiguous site of back, buttock and hip, stage 4: Secondary | ICD-10-CM | POA: Diagnosis not present

## 2016-08-27 DIAGNOSIS — Z931 Gastrostomy status: Secondary | ICD-10-CM | POA: Diagnosis not present

## 2016-08-27 DIAGNOSIS — I10 Essential (primary) hypertension: Secondary | ICD-10-CM

## 2016-08-27 NOTE — Progress Notes (Signed)
Patient ID: Katherine Palmer, female   DOB: [birth year], 55 y.o.   MRN: [id number]    HISTORY AND PHYSICAL   DATE: 08/27/2016  Location:     [place] Room Number: 144 A Place of Service: SNF (31)   Extended Emergency Contact Information Primary Emergency Contact: Dipasquale,Katawbba Address: [address] [place], [place] [address] Johnnette Litter of [place] Phone: [phone] Relation: Daughter Secondary Emergency Contact: Lonny Prude Address: [address]          [place], [place] [address] Johnnette Litter of [place] Mobile Phone: [phone] Relation: Daughter  Advanced Directive information Does Patient Have a Medical Advance Directive?: No  Chief Complaint  Patient presents with  . 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 MRI revealed chronic osteo with concern for acute osteo of iliac bones. IR drained right hip adducter abscess on 12/4th --> cx unrevealing. Consults included ID, Ortho, general sx, and IR. WBC 14.6K --> 10.4K; hgb 9.6; plts 470K; urine cx (+) pseudomonas and ESBL E coli; albumin 1.4; Cr 0.5; K 5.4; CRP 1.36; sed rate 100. She presents to SNF for long term care.   Today she reports no concerns but she is lethargic and is a poor historian. No nursing issues.she is on contact isolation for ESBL E coli and pseudomonas. 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     Depression with anxiety - stable on lexapro 10 mg daily; depakote  200 mg every 8 hours for mood stabilization; ativan 0.5 mg every 8 hours as needed for anxiety  Hypertension - stable on lopressor 25 mg twice daily   Hx Anemia - stable; Hgb 9.6   Constipation - stable on miralax daily   Dyslipidemia - stable on pravachol 40 mg daily. LDL 35  GERD - stable on prevacid 30 mg daily  s/p left AKA  s/p colostomy  Past Medical History:  Diagnosis Date  . Decubitus ulcer of sacral region, stage 4 ([facility])   . Depression   . Dysphagia   . Dysphagia as late effect of cerebrovascular disease   . Hyperlipidemia   . Hypertension   . Seizures ([place])   . Stroke [facility])     Past Surgical History:  Procedure Laterality Date  . 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 COLOSTOMY    . PEG TUBE PLACEMENT      Patient Care Team: Gildardo Cranker, DO as PCP - General (Internal Medicine) Gerlene Fee, NP as Nurse Practitioner (Geriatric Medicine) [facility] ([place])  Social History   Social History  . Marital status: Divorced    Spouse name: N/A  . Number of children: N/A  .  Years of education: N/A   Occupational History  . Not on file.   Social History Main Topics  . Smoking status: Former Research scientist (life sciences)  . Smokeless tobacco: Never Used  . Alcohol use No  . Drug use: No  . Sexual activity: No   Other Topics Concern  . Not on file   Social History Narrative  . No narrative on file     reports that she has quit smoking. She has never used smokeless tobacco. She reports that she does not drink alcohol or use drugs.  Family History  Problem Relation Age of Onset  . Hypertension Other    Family Status  Relation Status  . Other     Immunization History  Administered  Date(s) Administered  . Influenza-Unspecified 07/26/2015, 07/12/2016  . PPD Test 04/09/2016, 05/21/2016  . Pneumococcal-Unspecified 04/12/2016    No Known Allergies  Medications: Patient's Medications  New Prescriptions   No medications on file  Previous Medications   ACETAMINOPHEN (TYLENOL) 325 MG TABLET    Place 650 mg into feeding tube every 4 (four) hours as needed for mild pain.    ALBUTEROL (PROVENTIL) (2.5 MG/3ML) 0.083% NEBULIZER SOLUTION    Take 2.5 mg by nebulization every 4 (four) hours as needed for wheezing or shortness of breath.   AMINO ACIDS-PROTEIN HYDROLYS (FEEDING SUPPLEMENT, PRO-STAT SUGAR FREE 64,) LIQD    Place 30 mLs into feeding tube 3 (three) times daily with meals.   ASCORBIC ACID (VITAMIN C) 500 MG TABLET    Take 500 mg by mouth daily.   ASPIRIN EC 81 MG TABLET    Take 81 mg by mouth daily. Via G-Tube    BISACODYL (DULCOLAX) 10 MG SUPPOSITORY    Place 10 mg rectally daily as needed for moderate constipation.   COLLAGENASE (SANTYL) OINTMENT    Apply 1 application topically daily.   ESCITALOPRAM (LEXAPRO) 5 MG TABLET    Place 10 mg into feeding tube daily.   HEPARIN FLUSH 10 UNIT/ML SOLN INJECTION    Inject 5 Units into the vein every 12 (twelve) hours as needed.   IPRATROPIUM-ALBUTEROL (DUONEB) 0.5-2.5 (3) MG/3ML SOLN    Take 3 mLs by nebulization every 6 (six) hours as needed (sob).    LANSOPRAZOLE (PREVACID SOLUTAB) 30 MG DISINTEGRATING TABLET    Take 30 mg by mouth daily.   LORAZEPAM (ATIVAN) 0.5 MG TABLET    Place 1 tablet (0.5 mg total) into feeding tube every 8 (eight) hours as needed for anxiety.   MAGNESIUM HYDROXIDE (MILK OF MAGNESIA) 400 MG/5ML SUSPENSION    Place 30 mLs into feeding tube daily as needed for mild constipation.    MEROPENEM (MERREM) IVPB    Inject 1 g into the vein every 12 (twelve) hours. Indication: chronic osteomyelitis  Last Day of Therapy: 09/20/16 Labs - Once weekly:  CBC/D and BMP, Labs - Every other week:  ESR and CRP    METOPROLOL TARTRATE (LOPRESSOR) 25 MG TABLET    Take 1 tablet (25 mg total) by mouth 2 (two) times daily.   MULTIPLE VITAMINS-MINERALS (DECUBI-VITE) CAPS    Take 1 capsule by mouth daily.   NUTRITIONAL SUPPLEMENTS (FEEDING SUPPLEMENT, OSMOLITE 1.5 CAL,) LIQD    Place 1,000 mLs into feeding tube continuous.   OXYCODONE (ROXICODONE) 5 MG IMMEDIATE RELEASE TABLET    Take one tablet by mouth every 6 hours as needed for pain   PANTOPRAZOLE SODIUM (PROTONIX) 40 MG/20 ML PACK    Place 40 mg into feeding tube daily.  POLYETHYLENE GLYCOL (MIRALAX / GLYCOLAX) PACKET    Place 17 g into feeding tube daily.   PRAVASTATIN (PRAVACHOL) 40 MG TABLET    Place 40 mg into feeding tube daily.   PRENATAL CA CARB-B6-B12-FA (FOLBECAL) 1 MG TABS    Give 1 tablet by tube daily.   SODIUM CHLORIDE FLUSH (NORMAL SALINE FLUSH) 0.9 % SOLN    Inject 10 mLs into the vein every 8 (eight) hours.   VALPROATE SODIUM (DEPAKENE) 250 MG/5ML SOLN SOLUTION    Take by mouth. Give 43m via G-Tube every 8 hours   VANCOMYCIN (VANCOCIN) 1-5 GM/200ML-% SOLN    Inject 200 mLs (1,000 mg total) into the vein every 8 (eight) hours. Last dose on 09/20/2016   WATER FOR IRRIGATION, STERILE (FREE WATER) SOLN    Place 200 mLs into feeding tube every 8 (eight) hours.   ZINC 100 MG TABS    Take 100 mg by mouth daily.   Modified Medications   No medications on file  Discontinued Medications   No medications on file    Review of Systems  Unable to perform ROS: Other (expressive aphasia)    Vitals:   08/27/16 1011  BP: (!) 156/88  Pulse: 94  Resp: 18  Temp: 99.1 F (37.3 C)  TempSrc: Oral  SpO2: 93%  Weight: 126 lb (57.2 kg)  Height: 5' 5"  (1.651 m)   Body mass index is 20.97 kg/m.  Physical Exam  Constitutional: She appears well-developed.  Frail appearing in NAD, lying in bed asleep but arousable  HENT:  Mouth/Throat: Oropharynx is clear and moist. No oropharyngeal exudate.  No oral thrush  Eyes: Pupils are equal, round, and  reactive to light. No scleral icterus.  Neck: Neck supple. Carotid bruit is not present. No tracheal deviation present. No thyromegaly present.  Cardiovascular: Normal rate, regular rhythm and intact distal pulses.   Occasional extrasystoles are present. Exam reveals no gallop and no friction rub.   Murmur (1/6 SEM) heard. Pulses:      Left popliteal pulse not accessible.       Left dorsalis pedis pulse not accessible.       Left posterior tibial pulse not accessible.  Left AKA; +1 pitting RLE edema. No calf TTP. Right arm PICC line intact with no redness or d/c at insertion site. vanco infusing  Pulmonary/Chest: Effort normal and breath sounds normal. No stridor. No respiratory distress. She has no wheezes. She has no rales.  CTA anteriorly  Abdominal: Soft. Bowel sounds are normal. She exhibits no distension and no mass. There is no hepatomegaly. There is no tenderness. There is no rebound and no guarding.    Genitourinary:  Genitourinary Comments: Foley DTG dark yellow urine  Musculoskeletal: She exhibits edema and deformity (LE contractures).  Contracted (flexion) LUE at elbow and fingers; left AKA looks well healed. No redness or TTP  Lymphadenopathy:    She has no cervical adenopathy.  Neurological: She is alert.  Left hemiparesis and hemiplegia; expressive aphasia  Skin: Skin is warm and dry. No rash noted.  Large Sacral decub stage IV with clean base and no purulent d/c. Wound vac intact  Psychiatric: She has a normal mood and affect. Her behavior is normal.     Labs reviewed: Nursing Home on 08/27/2016  Component Date Value Ref Range Status  . Hemoglobin 08/26/2016 9.6* 12.0 - 16.0 g/dL Final  . HCT 08/26/2016 32* 36 - 46 % Final  . Platelets 08/26/2016 470* 150 - 399 K/L Final  .  WBC 08/26/2016 10.4  10^3/mL Final  . Glucose 08/26/2016 138  mg/dL Final  . BUN 08/26/2016 15  4 - 21 mg/dL Final  . Creatinine 08/26/2016 0.5  0.5 - 1.1 mg/dL Final  . Potassium 08/26/2016  5.4* 3.4 - 5.3 mmol/L Final  . Sodium 08/26/2016 134* 137 - 147 mmol/L Final  Admission on 08/08/2016, Discharged on 08/22/2016  No results displayed because visit has over 200 results.    Abstract on 07/25/2016  Component Date Value Ref Range Status  . Hemoglobin 07/25/2016 7.5* 12.0 - 16.0 g/dL Final  . HCT 07/25/2016 25* 36 - 46 % Final  . Platelets 07/25/2016 404* 150 - 399 K/L Final  . WBC 07/25/2016 11.0  10^3/mL Final  . Glucose 07/25/2016 113  mg/dL Final  . BUN 07/25/2016 22* 4 - 21 mg/dL Final  . Creatinine 07/25/2016 0.2* 0.5 - 1.1 mg/dL Final  . Potassium 07/25/2016 5.6* 3.4 - 5.3 mmol/L Final  . Sodium 07/25/2016 131* 137 - 147 mmol/L Final  . Magnesium 07/25/2016 1.5   Final  Admission on 07/16/2016, Discharged on 07/22/2016  Component Date Value Ref Range Status  . Lactic Acid, Venous 07/16/2016 1.84  0.5 - 1.9 mmol/L Final  . Sodium 07/16/2016 133* 135 - 145 mmol/L Final  . Potassium 07/16/2016 5.2* 3.5 - 5.1 mmol/L Final   Comment: SLIGHT HEMOLYSIS DELTA CHECK NOTED   . Chloride 07/16/2016 102  101 - 111 mmol/L Final  . CO2 07/16/2016 26  22 - 32 mmol/L Final  . Glucose, Bld 07/16/2016 156* 65 - 99 mg/dL Final  . BUN 07/16/2016 61* 6 - 20 mg/dL Final  . Creatinine, Ser 07/16/2016 0.48  0.44 - 1.00 mg/dL Final  . Calcium 07/16/2016 11.0* 8.9 - 10.3 mg/dL Final  . Total Protein 07/16/2016 6.0* 6.5 - 8.1 g/dL Final  . Albumin 07/16/2016 1.5* 3.5 - 5.0 g/dL Final  . AST 07/16/2016 25  15 - 41 U/L Final  . ALT 07/16/2016 11* 14 - 54 U/L Final  . Alkaline Phosphatase 07/16/2016 90  38 - 126 U/L Final  . Total Bilirubin 07/16/2016 0.7  0.3 - 1.2 mg/dL Final  . GFR calc non Af Amer 07/16/2016 >60  >60 mL/min Final  . GFR calc Af Amer 07/16/2016 >60  >60 mL/min Final   Comment: (NOTE) The eGFR has been calculated using the CKD EPI equation. This calculation has not been validated in all clinical situations. eGFR's persistently <60 mL/min signify possible Chronic  Kidney Disease.   . Anion gap 07/16/2016 5  5 - 15 Final  . WBC 07/16/2016 11.8* 4.0 - 10.5 K/uL Final  . RBC 07/16/2016 2.37* 3.87 - 5.11 MIL/uL Final  . Hemoglobin 07/16/2016 6.6* 12.0 - 15.0 g/dL Final   Comment: CRITICAL RESULT CALLED TO, READ BACK BY AND VERIFIED WITH: J.TALKINGTON RN AT 1943 ON 07/16/16 BY S.VANHOORNE   . HCT 07/16/2016 21.7* 36.0 - 46.0 % Final  . MCV 07/16/2016 91.6  78.0 - 100.0 fL Final  . MCH 07/16/2016 27.8  26.0 - 34.0 pg Final  . MCHC 07/16/2016 30.4  30.0 - 36.0 g/dL Final  . RDW 07/16/2016 20.0* 11.5 - 15.5 % Final  . Platelets 07/16/2016 567* 150 - 400 K/uL Final  . Neutrophils Relative % 07/16/2016 64  % Final  . Neutro Abs 07/16/2016 7.5  1.7 - 7.7 K/uL Final  . Lymphocytes Relative 07/16/2016 16  % Final  . Lymphs Abs 07/16/2016 1.9  0.7 - 4.0 K/uL Final  . Monocytes  Relative 07/16/2016 7  % Final  . Monocytes Absolute 07/16/2016 0.9  0.1 - 1.0 K/uL Final  . Eosinophils Relative 07/16/2016 13  % Final  . Eosinophils Absolute 07/16/2016 1.5* 0.0 - 0.7 K/uL Final  . Basophils Relative 07/16/2016 0  % Final  . Basophils Absolute 07/16/2016 0.0  0.0 - 0.1 K/uL Final  . Color, Urine 07/16/2016 YELLOW  YELLOW Final  . APPearance 07/16/2016 CLOUDY* CLEAR Final  . Specific Gravity, Urine 07/16/2016 1.019  1.005 - 1.030 Final  . pH 07/16/2016 7.5  5.0 - 8.0 Final  . Glucose, UA 07/16/2016 NEGATIVE  NEGATIVE mg/dL Final  . Hgb urine dipstick 07/16/2016 NEGATIVE  NEGATIVE Final  . Bilirubin Urine 07/16/2016 NEGATIVE  NEGATIVE Final  . Ketones, ur 07/16/2016 NEGATIVE  NEGATIVE mg/dL Final  . Protein, ur 07/16/2016 NEGATIVE  NEGATIVE mg/dL Final  . Nitrite 07/16/2016 POSITIVE* NEGATIVE Final  . Leukocytes, UA 07/16/2016 MODERATE* NEGATIVE Final  . Sodium 07/16/2016 128* 135 - 145 mmol/L Final  . Potassium 07/16/2016 7.2* 3.5 - 5.1 mmol/L Final  . Chloride 07/16/2016 98* 101 - 111 mmol/L Final  . BUN 07/16/2016 85* 6 - 20 mg/dL Final  . Creatinine, Ser  07/16/2016 0.70  0.44 - 1.00 mg/dL Final  . Glucose, Bld 07/16/2016 87  65 - 99 mg/dL Final  . Calcium, Ion 07/16/2016 1.13* 1.15 - 1.40 mmol/L Final  . TCO2 07/16/2016 28  0 - 100 mmol/L Final  . Hemoglobin 07/16/2016 8.8* 12.0 - 15.0 g/dL Final  . HCT 07/16/2016 26.0* 36.0 - 46.0 % Final  . Comment 07/16/2016 NOTIFIED PHYSICIAN   Final  . Squamous Epithelial / LPF 07/16/2016 6-30* NONE SEEN Final  . WBC, UA 07/16/2016 6-30  0 - 5 WBC/hpf Final  . RBC / HPF 07/16/2016 0-5  0 - 5 RBC/hpf Final  . Bacteria, UA 07/16/2016 FEW* NONE SEEN Final  . ABO/RH(D) 07/18/2016 O POS   Final  . Antibody Screen 07/18/2016 NEG   Final  . Sample Expiration 07/18/2016 07/19/2016   Final  . Unit Number 07/18/2016 [id number]   Final  . Blood Component Type 07/18/2016 RED CELLS,LR   Final  . Unit division 07/18/2016 00   Final  . Status of Unit 07/18/2016 ISSUED,FINAL   Final  . Transfusion Status 07/18/2016 OK TO TRANSFUSE   Final  . Crossmatch Result 07/18/2016 Compatible   Final  . Order Confirmation 07/16/2016 ORDER PROCESSED BY BLOOD BANK   Final  . Vitamin B-12 07/17/2016 647  180 - 914 pg/mL Final   Comment: (NOTE) This assay is not validated for testing neonatal or myeloproliferative syndrome specimens for Vitamin B12 levels. Performed at [facility]   . Folate 07/17/2016 57.0  >5.9 ng/mL Final   Comment: RESULTS CONFIRMED BY MANUAL DILUTION Performed at [facility]   . Iron 07/17/2016 8* 28 - 170 ug/dL Final  . TIBC 07/17/2016 197* 250 - 450 ug/dL Final  . Saturation Ratios 07/17/2016 4* 10.4 - 31.8 % Final  . UIBC 07/17/2016 189  ug/dL Final  . Ferritin 07/17/2016 346* 11 - 307 ng/mL Final  . Retic Ct Pct 07/16/2016 5.0* 0.4 - 3.1 % Final  . RBC. 07/16/2016 3.86* 3.87 - 5.11 MIL/uL Final  . Retic Count, Manual 07/16/2016 193.0* 19.0 - 186.0 K/uL Final  . Sodium 07/17/2016 135  135 - 145 mmol/L Final  . Potassium 07/17/2016 5.1  3.5 - 5.1 mmol/L Final  . Chloride  07/17/2016 102  101 - 111 mmol/L Final  .  CO2 07/17/2016 23  22 - 32 mmol/L Final  . Glucose, Bld 07/17/2016 137* 65 - 99 mg/dL Final  . BUN 07/17/2016 46* 6 - 20 mg/dL Final  . Creatinine, Ser 07/17/2016 0.40* 0.44 - 1.00 mg/dL Final  . Calcium 07/17/2016 8.4* 8.9 - 10.3 mg/dL Final   Comment: DELTA CHECK NOTED REPEATED TO VERIFY   . Total Protein 07/17/2016 6.5  6.5 - 8.1 g/dL Final  . Albumin 07/17/2016 1.6* 3.5 - 5.0 g/dL Final  . AST 07/17/2016 20  15 - 41 U/L Final  . ALT 07/17/2016 13* 14 - 54 U/L Final  . Alkaline Phosphatase 07/17/2016 95  38 - 126 U/L Final  . Total Bilirubin 07/17/2016 0.7  0.3 - 1.2 mg/dL Final  . GFR calc non Af Amer 07/17/2016 >60  >60 mL/min Final  . GFR calc Af Amer 07/17/2016 >60  >60 mL/min Final   Comment: (NOTE) The eGFR has been calculated using the CKD EPI equation. This calculation has not been validated in all clinical situations. eGFR's persistently <60 mL/min signify possible Chronic Kidney Disease.   . Anion gap 07/17/2016 10  5 - 15 Final  . WBC 07/17/2016 13.7* 4.0 - 10.5 K/uL Final  . RBC 07/17/2016 2.99* 3.87 - 5.11 MIL/uL Final  . Hemoglobin 07/17/2016 8.3* 12.0 - 15.0 g/dL Final   Comment: DELTA CHECK NOTED POST TRANSFUSION SPECIMEN   . HCT 07/17/2016 26.7* 36.0 - 46.0 % Final  . MCV 07/17/2016 89.3  78.0 - 100.0 fL Final  . MCH 07/17/2016 27.8  26.0 - 34.0 pg Final  . MCHC 07/17/2016 31.1  30.0 - 36.0 g/dL Final  . RDW 07/17/2016 18.9* 11.5 - 15.5 % Final  . Platelets 07/17/2016 580* 150 - 400 K/uL Final  . Specimen Description 07/22/2016 BLOOD RIGHT WRIST   Final  . Special Requests 07/22/2016 BOTTLES DRAWN AEROBIC AND ANAEROBIC 5CC   Final  . Culture 07/22/2016    Final                   Value:NO GROWTH 5 DAYS Performed at [facility]   . Report Status 07/22/2016 07/22/2016 FINAL   Final  . Specimen Description 07/22/2016 BLOOD BLOOD LEFT FOREARM   Final  . Special Requests 07/22/2016 IN PEDIATRIC BOTTLE 2CC    Final  . Culture 07/22/2016    Final                   Value:NO GROWTH 5 DAYS Performed at [facility]   . Report Status 07/22/2016 07/22/2016 FINAL   Final  . Haptoglobin 07/18/2016 367* 34 - 200 mg/dL Final   Comment: (NOTE) Performed At: [facility] [place], [place] [id number] Lindon Romp MD BW:[id number]   . LDH 07/16/2016 145  98 - 192 U/L Final  . ABO/RH(D) 07/16/2016 O POS   Final  . MRSA by PCR 07/17/2016 POSITIVE* NEGATIVE Final   Comment:        The GeneXpert MRSA Assay (FDA approved for NASAL specimens only), is one component of a comprehensive MRSA colonization surveillance program. It is not intended to diagnose MRSA infection nor to guide or monitor treatment for MRSA infections. RESULT CALLED TO, READ BACK BY AND VERIFIED WITH: Therese Sarah RN [id number] 07/17/16 A NAVARRO   . Specimen Description 07/21/2016 URINE, RANDOM   Final  . Special Requests 07/21/2016 NONE   Final  . Culture 07/21/2016 40,000 COLONIES/mL PSEUDOMONAS AERUGINOSA*  Final  . Report Status  07/21/2016 07/21/2016 FINAL   Final  . Organism ID, Bacteria 07/21/2016 PSEUDOMONAS AERUGINOSA*  Final  . WBC 07/19/2016 13.8* 4.0 - 10.5 K/uL Final  . RBC 07/19/2016 2.88* 3.87 - 5.11 MIL/uL Final  . Hemoglobin 07/19/2016 7.9* 12.0 - 15.0 g/dL Final  . HCT 07/19/2016 26.1* 36.0 - 46.0 % Final  . MCV 07/19/2016 90.6  78.0 - 100.0 fL Final  . MCH 07/19/2016 27.4  26.0 - 34.0 pg Final  . MCHC 07/19/2016 30.3  30.0 - 36.0 g/dL Final  . RDW 07/19/2016 18.2* 11.5 - 15.5 % Final  . Platelets 07/19/2016 531* 150 - 400 K/uL Final  . Sodium 07/19/2016 137  135 - 145 mmol/L Final  . Potassium 07/19/2016 4.9  3.5 - 5.1 mmol/L Final  . Chloride 07/19/2016 107  101 - 111 mmol/L Final  . CO2 07/19/2016 24  22 - 32 mmol/L Final  . Glucose, Bld 07/19/2016 130* 65 - 99 mg/dL Final  . BUN 07/19/2016 23* 6 - 20 mg/dL Final  . Creatinine, Ser 07/19/2016 <0.30* 0.44 - 1.00 mg/dL Final  .  Calcium 07/19/2016 8.4* 8.9 - 10.3 mg/dL Final  . GFR calc non Af Amer 07/19/2016 NOT CALCULATED  >60 mL/min Final  . GFR calc Af Amer 07/19/2016 NOT CALCULATED  >60 mL/min Final   Comment: (NOTE) The eGFR has been calculated using the CKD EPI equation. This calculation has not been validated in all clinical situations. eGFR's persistently <60 mL/min signify possible Chronic Kidney Disease.   . Anion gap 07/19/2016 6  5 - 15 Final  . WBC 07/20/2016 11.5* 4.0 - 10.5 K/uL Final  . RBC 07/20/2016 2.66* 3.87 - 5.11 MIL/uL Final  . Hemoglobin 07/20/2016 7.4* 12.0 - 15.0 g/dL Final  . HCT 07/20/2016 24.2* 36.0 - 46.0 % Final  . MCV 07/20/2016 91.0  78.0 - 100.0 fL Final  . MCH 07/20/2016 27.8  26.0 - 34.0 pg Final  . MCHC 07/20/2016 30.6  30.0 - 36.0 g/dL Final  . RDW 07/20/2016 17.9* 11.5 - 15.5 % Final  . Platelets 07/20/2016 551* 150 - 400 K/uL Final  . Fecal Occult Bld 07/20/2016 NEGATIVE  NEGATIVE Final  . Fecal Occult Bld 07/21/2016 NEGATIVE  NEGATIVE Final  . WBC 07/21/2016 12.2* 4.0 - 10.5 K/uL Final  . RBC 07/21/2016 2.73* 3.87 - 5.11 MIL/uL Final  . Hemoglobin 07/21/2016 7.7* 12.0 - 15.0 g/dL Final  . HCT 07/21/2016 25.0* 36.0 - 46.0 % Final  . MCV 07/21/2016 91.6  78.0 - 100.0 fL Final  . MCH 07/21/2016 28.2  26.0 - 34.0 pg Final  . MCHC 07/21/2016 30.8  30.0 - 36.0 g/dL Final  . RDW 07/21/2016 17.6* 11.5 - 15.5 % Final  . Platelets 07/21/2016 558* 150 - 400 K/uL Final  . Sodium 07/21/2016 136  135 - 145 mmol/L Final  . Potassium 07/21/2016 4.8  3.5 - 5.1 mmol/L Final  . Chloride 07/21/2016 106  101 - 111 mmol/L Final  . CO2 07/21/2016 23  22 - 32 mmol/L Final  . Glucose, Bld 07/21/2016 121* 65 - 99 mg/dL Final  . BUN 07/21/2016 21* 6 - 20 mg/dL Final  . Creatinine, Ser 07/21/2016 <0.30* 0.44 - 1.00 mg/dL Final  . Calcium 07/21/2016 8.1* 8.9 - 10.3 mg/dL Final  . Total Protein 07/21/2016 6.0* 6.5 - 8.1 g/dL Final  . Albumin 07/21/2016 1.3* 3.5 - 5.0 g/dL Final  . AST  07/21/2016 13* 15 - 41 U/L Final  . ALT 07/21/2016 11* 14 - 54  U/L Final  . Alkaline Phosphatase 07/21/2016 82  38 - 126 U/L Final  . Total Bilirubin 07/21/2016 0.4  0.3 - 1.2 mg/dL Final  . GFR calc non Af Amer 07/21/2016 NOT CALCULATED  >60 mL/min Final  . GFR calc Af Amer 07/21/2016 NOT CALCULATED  >60 mL/min Final   Comment: (NOTE) The eGFR has been calculated using the CKD EPI equation. This calculation has not been validated in all clinical situations. eGFR's persistently <60 mL/min signify possible Chronic Kidney Disease.   . Anion gap 07/21/2016 7  5 - 15 Final  . Fecal Occult Bld 07/22/2016 NEGATIVE  NEGATIVE Final  . Magnesium 07/21/2016 1.5* 1.7 - 2.4 mg/dL Final  Nursing Home on 07/16/2016  Component Date Value Ref Range Status  . Hemoglobin 07/16/2016 7.7* 12.0 - 16.0 g/dL Final  . HCT 07/16/2016 26* 36 - 46 % Final  . Platelets 07/16/2016 601* 150 - 399 K/L Final  . WBC 07/16/2016 16.5  10^3/mL Final  . Glucose 07/16/2016 137  mg/dL Final  . BUN 07/16/2016 62* 4 - 21 mg/dL Final  . Creatinine 07/16/2016 0.4* 0.5 - 1.1 mg/dL Final  . Potassium 07/16/2016 6.9* 3.4 - 5.3 mmol/L Final  . Sodium 07/16/2016 133* 137 - 147 mmol/L Final  . Alkaline Phosphatase 07/16/2016 127* 25 - 125 U/L Final  . ALT 07/16/2016 11  7 - 35 U/L Final  . AST 07/16/2016 12* 13 - 35 U/L Final  Nursing Home on 06/20/2016  Component Date Value Ref Range Status  . Hemoglobin 05/24/2016 8.4* 12.0 - 16.0 g/dL Final  . HCT 05/24/2016 28* 36 - 46 % Final  . Platelets 05/24/2016 475* 150 - 399 K/L Final  . WBC 05/24/2016 9.1  10^3/mL Final  . Glucose 05/24/2016 119  mg/dL Final  . BUN 05/24/2016 11  4 - 21 mg/dL Final  . Creatinine 05/24/2016 0.3* 0.5 - 1.1 mg/dL Final  . Potassium 05/24/2016 4.8  3.4 - 5.3 mmol/L Final  . Sodium 05/24/2016 137  137 - 147 mmol/L Final    Mr Hip Right W Wo Contrast  Result Date: 08/16/2016 CLINICAL DATA:  55 year old with fever, right sacral decubitus ulcer and  extensive chronic inflammatory changes around the right hip on CT. Previous left above the knee amputation. EXAM: MRI OF THE RIGHT HIP WITHOUT AND WITH CONTRAST MR SACRUM AND SACROILIAC JOINTS WITHOUT AND WITH CONTRAST TECHNIQUE: Multiplanar, multisequence MR imaging was performed both before and after administration of intravenous contrast. CONTRAST:  10 ml MultiHance. COMPARISON:  CT 08/15/2016. FINDINGS: Bones/Joint/Cartilage : As correlated with CT, there is diffuse destruction, remodeling and sclerosis of the lower sacrum and coccyx, most consistent with chronic osteomyelitis. The sacroiliac joints and symphysis pubis are intact. Both hips are located. There is extensive heterotopic ossification surrounding the right hip joint. There is an associated complex right hip joint effusion with a large peripherally enhancing fluid collection within the right adductor musculature. This fluid collection is best seen on the postcontrast images (series 37 through 42). It measures up to 3.9 x 12.7 x 3.2 cm. No other focal fluid collections are seen. There is some marrow edema and enhancement posteriorly in both iliac bones. No evidence of proximal femoral osteomyelitis. Ligaments Not relevant for exam/indication. Muscles and Tendons There is asymmetric T2 hyperintensity and enhancement throughout the right pelvic and proximal thigh musculature. As above, there is a large fluid collection within the right hip adductor musculature. The iliopsoas and hamstring tendons appear grossly intact. Soft tissues There is decubitus  soft tissue ulceration posteriorly over the sacrum and right hip joint. There is diffuse edema and enhancement throughout the adjacent soft tissues, but no other focal fluid collections. Foley catheter and a small amount of free pelvic fluid noted. IMPRESSION: 1. Chronic destruction and remodeling of the lower sacrum and coccyx consistent with chronic osteomyelitis. There is marrow edema and enhancement  posteriorly in both iliac bones, suspicious for osteomyelitis. 2. Extensive heterotopic ossification and synovitis surrounding the right hip with large peripherally enhancing fluid collection in the right hip adductor musculature, worrisome for soft tissue abscess. No specific evidence of active osteomyelitis at the right hip. 3. Multifocal decubitus ulceration in the pelvis with associated generalized soft tissue inflammatory changes. Electronically Signed   By: Richardean Sale M.D.   On: 08/16/2016 11:31   Ct Abdomen Pelvis W Contrast  Result Date: 08/15/2016 CLINICAL DATA:  Acute onset of fever of unknown origin. Initial encounter. EXAM: CT ABDOMEN AND PELVIS WITH CONTRAST TECHNIQUE: Multidetector CT imaging of the abdomen and pelvis was performed using the standard protocol following bolus administration of intravenous contrast. CONTRAST:  100 mL ISOVUE-300 IOPAMIDOL (ISOVUE-300) INJECTION 61% COMPARISON:  CT of the abdomen and pelvis performed 03/10/2016 FINDINGS: Lower chest: Trace bilateral pleural effusions are noted. There is partial consolidation of the left lower lobe. The heart is enlarged. Scattered coronary artery calcification is noted. Hepatobiliary: A 6 mm hypodensity is noted within the left hepatic lobe. A calcified granuloma is noted within the liver. Small stones are seen dependently within the gallbladder. There is distention of the common bile duct to 8-9 mm in diameter. Pancreas: The pancreatic duct is dilated to 5 mm in diameter, raising concern for distal obstruction. Spleen: The spleen is unremarkable in appearance. Adrenals/Urinary Tract: The adrenal glands are unremarkable in appearance. Small bilateral renal cysts are noted. Nonspecific left-sided perinephric stranding is seen. There is no evidence of hydronephrosis. No renal or ureteral stones are identified, though evaluation for renal stones is limited given contrast in the renal calyces. Stomach/Bowel: The stomach is  decompressed. A G-tube is noted ending at the body of the stomach. The small bowel is decompressed and grossly unremarkable. The appendix is normal in caliber, without evidence of appendicitis. Scattered diverticulosis is noted along the cecum and ascending colon, with a right upper quadrant colostomy noted. The more distal colon is decompressed, with scattered diverticulosis along the transverse, descending and proximal sigmoid colon. There is no evidence of diverticulitis. Vascular/Lymphatic: Scattered calcification is seen along the abdominal aorta and its branches, including at the proximal renal arteries bilaterally. The abdominal aorta is otherwise grossly unremarkable. The inferior vena cava is grossly unremarkable. No retroperitoneal lymphadenopathy is seen. No pelvic sidewall lymphadenopathy is identified. Reproductive: The bladder is decompressed, with a Foley catheter in place, and a small amount of contrast in the bladder. The uterus is grossly unremarkable. The ovaries are relatively symmetric. No suspicious adnexal masses are seen. Other: Diffuse soft-tissue phlegmon is noted at the right hip, with remodeling of the anterior right femur and acetabulum, and of the posterior sacrum and coccyx. Findings are compatible with chronic osteomyelitis and large sacral decubitus ulcerations. Mild phlegmon is noted at the left hip. Vague fluid is seen tracking along the musculature of the proximal right thigh. Presacral stranding is noted. Musculoskeletal: No acute osseous abnormalities are identified. The patient is status post thoracic spinal fusion. The visualized musculature is unremarkable in appearance. IMPRESSION: 1. Diffuse soft tissue phlegmon at the right hip. Remodeling of the anterior right femur and  acetabulum, and at the posterior sacrum and coccyx, compatible with chronic osteomyelitis, due to underlying large sacral decubitus ulcerations. Mild phlegmon at the left hip. Vague fluid tracks along the  musculature of the proximal right thigh, without focal abscess. Presacral stranding noted. 2. Trace bilateral pleural effusions. Partial consolidation of the left lower lobe, which could reflect pneumonia. 3. Dilatation of the common bile duct to 8-9 mm in diameter, and distention of the pancreatic duct to 5 mm, raising concern for distal obstruction. MRCP or ERCP could be considered for further evaluation, as deemed clinically appropriate. 4. Cholelithiasis. 5. Cardiomegaly.  Scattered coronary artery calcifications noted. 6. 6 mm nonspecific hypodensity within the left hepatic lobe. 7. Scattered diverticulosis along the cecum, and ascending, transverse, descending and proximal sigmoid colon. Right upper quadrant colostomy is unremarkable in appearance. No evidence of diverticulitis. 8. Scattered aortic atherosclerosis. Calcification at the proximal renal arteries bilaterally. Electronically Signed   By: Garald Balding M.D.   On: 08/15/2016 02:12   Mr Sacrum Si Joints W Wo Contrast  Result Date: 08/16/2016 CLINICAL DATA:  55 year old with fever, right sacral decubitus ulcer and extensive chronic inflammatory changes around the right hip on CT. Previous left above the knee amputation. EXAM: MRI OF THE RIGHT HIP WITHOUT AND WITH CONTRAST MR SACRUM AND SACROILIAC JOINTS WITHOUT AND WITH CONTRAST TECHNIQUE: Multiplanar, multisequence MR imaging was performed both before and after administration of intravenous contrast. CONTRAST:  10 ml MultiHance. COMPARISON:  CT 08/15/2016. FINDINGS: Bones/Joint/Cartilage : As correlated with CT, there is diffuse destruction, remodeling and sclerosis of the lower sacrum and coccyx, most consistent with chronic osteomyelitis. The sacroiliac joints and symphysis pubis are intact. Both hips are located. There is extensive heterotopic ossification surrounding the right hip joint. There is an associated complex right hip joint effusion with a large peripherally enhancing fluid  collection within the right adductor musculature. This fluid collection is best seen on the postcontrast images (series 37 through 42). It measures up to 3.9 x 12.7 x 3.2 cm. No other focal fluid collections are seen. There is some marrow edema and enhancement posteriorly in both iliac bones. No evidence of proximal femoral osteomyelitis. Ligaments Not relevant for exam/indication. Muscles and Tendons There is asymmetric T2 hyperintensity and enhancement throughout the right pelvic and proximal thigh musculature. As above, there is a large fluid collection within the right hip adductor musculature. The iliopsoas and hamstring tendons appear grossly intact. Soft tissues There is decubitus soft tissue ulceration posteriorly over the sacrum and right hip joint. There is diffuse edema and enhancement throughout the adjacent soft tissues, but no other focal fluid collections. Foley catheter and a small amount of free pelvic fluid noted. IMPRESSION: 1. Chronic destruction and remodeling of the lower sacrum and coccyx consistent with chronic osteomyelitis. There is marrow edema and enhancement posteriorly in both iliac bones, suspicious for osteomyelitis. 2. Extensive heterotopic ossification and synovitis surrounding the right hip with large peripherally enhancing fluid collection in the right hip adductor musculature, worrisome for soft tissue abscess. No specific evidence of active osteomyelitis at the right hip. 3. Multifocal decubitus ulceration in the pelvis with associated generalized soft tissue inflammatory changes. Electronically Signed   By: Richardean Sale M.D.   On: 08/16/2016 11:31   Mr Abdomen Mrcp Wo Contrast  Result Date: 08/22/2016 CLINICAL DATA:  "Bile duct abnormality." CVA with hemi paresis. PEG tube. Urinary tract infection. CT demonstrating mild common duct and pancreatic duct dilatation. EXAM: MRI ABDOMEN WITHOUT CONTRAST  (INCLUDING MRCP) TECHNIQUE: Multiplanar multisequence  MR imaging of the  abdomen was performed. Heavily T2-weighted images of the biliary and pancreatic ducts were obtained, and three-dimensional MRCP images were rendered by post processing. COMPARISON:  08/15/2016 CT. FINDINGS: Moderate-to-marked motion degradation throughout. Lower chest: Cardiomegaly. Trace bilateral pleural fluid may be physiologic. Hepatobiliary: Hepatomegaly at 20.3 cm craniocaudal. 6 mm left hepatic lobe cyst. Normal gallbladder. No intrahepatic biliary duct dilatation. Common duct mildly dilated for age, 9 mm on image 21/ series 3. Upper normal in this age group 627 mm. No choledocholithiasis or obstructive mass. Pancreas: Normal appearance of the pancreas. No residual pancreatic duct dilatation identified. No evidence of acute pancreatitis. Spleen:  Normal in size, without focal abnormality. Adrenals/Urinary Tract: Mild left adrenal thickening/nodularity. Normal right adrenal gland. Bilateral renal cysts. No hydronephrosis. Stomach/Bowel: Normal stomach and abdominal small bowel. PEG tube. Right upper quadrant colostomy. Vascular/Lymphatic: Normal caliber of the aorta and branch vessels. No abdominal adenopathy. Other:  No ascites.  Diffuse anasarca. Musculoskeletal: Convex left lumbar spine curvature with multilevel degenerative disc disease. IMPRESSION: 1. Moderate to markedly motion degraded exam. 2. Mild common duct dilatation, without obstructive stone or mass. Especially given the normal bilirubin level on 08/14/2016, favored to be within normal variation. 3. Anasarca. Electronically Signed   By: Abigail Miyamoto M.D.   On: 08/22/2016 08:20   Ct Aspiration  Result Date: 08/20/2016 CLINICAL DATA:  55 year old with fever, right sacral decubitus ulcer and extensive chronic inflammatory changes around the right hip on CT. Previous left above the knee amputation.  Recent MR demonstrates Chronic destruction and remodeling of the lower sacrum and coccyxconsistent with chronic osteomyelitis. There is marrow edema  andenhancement posteriorly in both iliac bones, suspicious forosteomyelitis. Extensive heterotopic ossification and synovitis surrounding theright hip with large peripherally enhancing fluid collection in theright hip adductor musculature, worrisome for soft tissue abscess.No specific evidence of active osteomyelitis at the right hip. Multifocal decubitus ulceration in the pelvis with associatedgeneralized soft tissue inflammatory changes. EXAM: CT GUIDED ASPIRATION BIOPSY OF RIGHT THIGH ANESTHESIA/SEDATION: Intravenous Fentanyl and Versed were administered as conscious sedation during continuous monitoring of the patient's level of consciousness and physiological / cardiorespiratory status by the radiology RN, with a total moderate sedation time of 20 minutes. PROCEDURE: The procedure risks, benefits, and alternatives were explained to the patient. Questions regarding the procedure were encouraged and answered. The patient understands and consents to the procedure. select axial scans through the right hip and upper thigh were obtained. The adductor musculature collection was localized based on recent MR imaging. An appropriate skin entry site was determined and marked. The operative field was prepped with chlorhexidinein a sterile fashion, and a sterile drape was applied covering the operative field. A sterile gown and sterile gloves were used for the procedure. Local anesthesia was provided with 1% Lidocaine. Under CT fluoroscopic guidance, an 18 gauge trocar needle was advanced into the collection. A scant amount of bloody fluid was aspirated, sent for Gram stain and culture. Postprocedure scans show no hemorrhage or other apparent complication. The patient tolerated the procedure well. COMPLICATIONS: None immediate FINDINGS: Poorly marginated low attenuation in the right adductor musculature corresponding to fluid signal process noted on reviewed previous MRI. CT-guided aspiration returned only a scant amount of  bloody fluid, sent for Gram stain and culture. IMPRESSION: 1. Technically successful CT-guided aspiration of right medial thigh fluid collection, returning scant blood, sent for Gram stain and culture. Electronically Signed   By: Lucrezia Europe M.D.   On: 08/20/2016 10:24   Mr 3d Recon At  Scanner  Result Date: 08/22/2016 CLINICAL DATA:  "Bile duct abnormality." CVA with hemi paresis. PEG tube. Urinary tract infection. CT demonstrating mild common duct and pancreatic duct dilatation. EXAM: MRI ABDOMEN WITHOUT CONTRAST  (INCLUDING MRCP) TECHNIQUE: Multiplanar multisequence MR imaging of the abdomen was performed. Heavily T2-weighted images of the biliary and pancreatic ducts were obtained, and three-dimensional MRCP images were rendered by post processing. COMPARISON:  08/15/2016 CT. FINDINGS: Moderate-to-marked motion degradation throughout. Lower chest: Cardiomegaly. Trace bilateral pleural fluid may be physiologic. Hepatobiliary: Hepatomegaly at 20.3 cm craniocaudal. 6 mm left hepatic lobe cyst. Normal gallbladder. No intrahepatic biliary duct dilatation. Common duct mildly dilated for age, 9 mm on image 21/ series 3. Upper normal in this age group 627 mm. No choledocholithiasis or obstructive mass. Pancreas: Normal appearance of the pancreas. No residual pancreatic duct dilatation identified. No evidence of acute pancreatitis. Spleen:  Normal in size, without focal abnormality. Adrenals/Urinary Tract: Mild left adrenal thickening/nodularity. Normal right adrenal gland. Bilateral renal cysts. No hydronephrosis. Stomach/Bowel: Normal stomach and abdominal small bowel. PEG tube. Right upper quadrant colostomy. Vascular/Lymphatic: Normal caliber of the aorta and branch vessels. No abdominal adenopathy. Other:  No ascites.  Diffuse anasarca. Musculoskeletal: Convex left lumbar spine curvature with multilevel degenerative disc disease. IMPRESSION: 1. Moderate to markedly motion degraded exam. 2. Mild common duct  dilatation, without obstructive stone or mass. Especially given the normal bilirubin level on 08/14/2016, favored to be within normal variation. 3. Anasarca. Electronically Signed   By: Abigail Miyamoto M.D.   On: 08/22/2016 08:20   Dg Chest Port 1 View  Result Date: 08/08/2016 CLINICAL DATA:  Sepsis. Altered mental status and lethargy tonight. History of strokes. EXAM: PORTABLE CHEST 1 VIEW COMPARISON:  05/16/2016 FINDINGS: Postoperative changes in the thoracic spine. Shallow inspiration. Cardiac enlargement. Mild pulmonary vascular congestion. Linear atelectasis in the mid lungs bilaterally. No edema or consolidation. Costophrenic angles are obscured and can't exclude small pleural effusions. No pneumothorax. Calcified aorta. Old right rib fractures. IMPRESSION: Cardiac enlargement with pulmonary vascular congestion. No edema or consolidation. Linear atelectasis in the mid lungs. Electronically Signed   By: Lucienne Capers M.D.   On: 08/08/2016 00:52     Assessment/Plan   ICD-9-CM ICD-10-CM   1. Urinary tract infection associated with indwelling urethral catheter, subsequent encounter V58.89 T83.511D    996.64 N39.0    599.0    2. Abscess of right hip 682.6 L02.415   3. 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 Other chronic osteomyelitis, other site ([place]) 730.18 M86.68   6. Failure to thrive syndrome, adult 783.7 R62.7   7. Status post above knee amputation of left lower extremity ([place]) V49.76 Z89.612   8. Colostomy present ([place]) V44.3 Z93.3   9. Protein-calorie malnutrition, severe 262 E43   10. Essential hypertension, benign 401.1 I10   11. PEG (percutaneous endoscopic gastrostomy) status ([place]) V44.1 Z93.1     Finish abx - continue contact isolation until done. STOP DATE 09/20/16  Pull PICC line at completion of abx  Cont  other meds as ordered  Wound care as ordered  TF as ordered. may have oral pleasure foods as tolerated  PT/Ot/ST as ordered  Foley cath/Peg tube/colostomy care as indicated  GOAL: long term care. Communicated with pt and nursing.  Will follow  Latecia Miler S. Eulas Post, D. O., F. Beggs  Meda Coffee  [facility] [place], [place] [id number] [phone] Cell (Monday-Friday 8 AM - 5 PM) [phone] After 5 PM and follow prompts

## 2016-09-05 ENCOUNTER — Encounter: Payer: Self-pay | Admitting: Adult Health

## 2016-09-05 ENCOUNTER — Non-Acute Institutional Stay (SKILLED_NURSING_FACILITY): Payer: Medicaid Other | Admitting: Adult Health

## 2016-09-05 DIAGNOSIS — I469 Cardiac arrest, cause unspecified: Secondary | ICD-10-CM | POA: Diagnosis not present

## 2016-09-16 NOTE — Progress Notes (Signed)
Location:   Psychiatrist of Service:  SNF ([id number])   CODE STATUS: full code   No Known Allergies  Chief Complaint  Patient presents with  . Acute Visit    left ear drainage     HPI:  I was asked to see her for left ear drainage. She was found without pulse or respirations. CPR was initiated by nursing staff EMS was in the building and took over.    Past Medical History:  Diagnosis Date  . Decubitus ulcer of sacral region, stage 4 ([place])   . Depression   . Dysphagia   . Dysphagia as late effect of cerebrovascular disease   . Hyperlipidemia   . Hypertension   . Seizures ([place])   . Stroke [facility])     Past Surgical History:  Procedure Laterality Date  . 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 COLOSTOMY    . PEG TUBE PLACEMENT      Social History   Social History  . Marital status: Divorced    Spouse name: N/A  . Number of children: N/A  . Years of education: N/A   Occupational History  . Not on file.   Social History Main Topics  . Smoking status: Former Research scientist (life sciences)  . Smokeless tobacco: Never Used  . Alcohol use No  . Drug use: No  . Sexual activity: No   Other Topics Concern  . Not on file   Social History Narrative  . No narrative on file   Family History  Problem Relation Age of Onset  . Hypertension Other       VITAL SIGNS BP (!) 135/55   Pulse 69   Temp 98.2 F (36.8 C)   Resp (!) 21   Ht 5' 5"  (1.651 m)   Wt 126 lb (57.2 kg)   SpO2 90%   BMI 20.97 kg/m   Patient's Medications  New Prescriptions   No medications on file  Previous Medications   ACETAMINOPHEN (TYLENOL) 325 MG TABLET    Place 650 mg into feeding tube every 4 (four) hours as needed for mild pain.    ALBUTEROL (PROVENTIL) (2.5 MG/3ML) 0.083% NEBULIZER SOLUTION    Take 2.5 mg by nebulization every 4 (four) hours as needed for wheezing or shortness of breath.   AMINO ACIDS-PROTEIN HYDROLYS (FEEDING SUPPLEMENT, PRO-STAT SUGAR FREE 64,) LIQD    Place 30 mLs into feeding  tube 3 (three) times daily with meals.   ASCORBIC ACID (VITAMIN C) 500 MG TABLET    Take 500 mg by mouth daily.   ASPIRIN EC 81 MG TABLET    Take 81 mg by mouth daily. Via G-Tube    BISACODYL (DULCOLAX) 10 MG SUPPOSITORY    Place 10 mg rectally daily as needed for moderate constipation.   COLLAGENASE (SANTYL) OINTMENT    Apply 1 application topically daily.   ESCITALOPRAM (LEXAPRO) 5 MG TABLET    Place 10 mg into feeding tube daily.   HEPARIN FLUSH 10 UNIT/ML SOLN INJECTION    Inject 5 Units into the vein every 12 (twelve) hours as needed.   IPRATROPIUM-ALBUTEROL (DUONEB) 0.5-2.5 (3) MG/3ML SOLN    Take 3 mLs by nebulization every 6 (six) hours as needed (sob).    LANSOPRAZOLE (PREVACID SOLUTAB) 30 MG DISINTEGRATING TABLET    Take 30 mg by mouth daily.   LORAZEPAM (ATIVAN) 0.5 MG TABLET    Place 1 tablet (0.5 mg total) into feeding tube every 8 (eight) hours as  needed for anxiety.   MAGNESIUM HYDROXIDE (MILK OF MAGNESIA) 400 MG/5ML SUSPENSION    Place 30 mLs into feeding tube daily as needed for mild constipation.    MEROPENEM (MERREM) IVPB    Inject 1 g into the vein every 12 (twelve) hours. Indication: chronic osteomyelitis  Last Day of Therapy: 09/20/16 Labs - Once weekly:  CBC/D and BMP, Labs - Every other week:  ESR and CRP   METOPROLOL TARTRATE (LOPRESSOR) 25 MG TABLET    Take 1 tablet (25 mg total) by mouth 2 (two) times daily.   MULTIPLE VITAMINS-MINERALS (DECUBI-VITE) CAPS    Take 1 capsule by mouth daily.   NUTRITIONAL SUPPLEMENTS (FEEDING SUPPLEMENT, OSMOLITE 1.5 CAL,) LIQD    Place 1,000 mLs into feeding tube continuous.   OXYCODONE (ROXICODONE) 5 MG IMMEDIATE RELEASE TABLET    Take one tablet by mouth every 6 hours as needed for pain   PANTOPRAZOLE SODIUM (PROTONIX) 40 MG/20 ML PACK    Place 40 mg into feeding tube daily.   POLYETHYLENE GLYCOL (MIRALAX / GLYCOLAX) PACKET    Place 17 g into feeding tube daily.   PRAVASTATIN (PRAVACHOL) 40 MG TABLET    Place 40 mg into feeding tube  daily.   PRENATAL CA CARB-B6-B12-FA (FOLBECAL) 1 MG TABS    Give 1 tablet by tube daily.   SODIUM CHLORIDE FLUSH (NORMAL SALINE FLUSH) 0.9 % SOLN    Inject 10 mLs into the vein every 8 (eight) hours.   VALPROATE SODIUM (DEPAKENE) 250 MG/5ML SOLN SOLUTION    Take by mouth. Give 31m via G-Tube every 8 hours   VANCOMYCIN (VANCOCIN) 1-5 GM/200ML-% SOLN    Inject 200 mLs (1,000 mg total) into the vein every 8 (eight) hours. Last dose on 09/20/2016   WATER FOR IRRIGATION, STERILE (FREE WATER) SOLN    Place 200 mLs into feeding tube every 8 (eight) hours.   ZINC 100 MG TABS    Take 100 mg by mouth daily.   Modified Medications   No medications on file  Discontinued Medications   No medications on file     SIGNIFICANT DIAGNOSTIC EXAMS   03-28-16: chest x-ray: 1. Improvement in lung aeration since prior study with a decrease in pleural effusions. There is central vascular congestion but no convincing pulmonary edema.   04-23-16: 2-d echo: 1. technically difficult study with limited views 2. Normal appearing left ventricular systolic function. EF 55-60% 3. Mild sclerotic aortic valve no significant stenosis by velocity 4. Mild concentric left ventricular hypertrophy 5. Moderate mitral regurgitation 6. Mild tricuspid regurgitation 7. Highly mobile interatrial septum with a suggestion of a septal defect. However, the septum and the valvular structures are not well visualized to determine if there is a defect or evidence of vegetations. 8. Recommend transesophageal echocardiogram for better visualization of te left atrial septum in addition to better visualize the valvular structures given the patient's history   05-16-16: chest x-ray; No acute cardiopulmonary disease.  05-16-16: ct of head: 1. Remote large right hemispheric infarction with extensive encephalomalacia and dystrophic calcifications. 2. No definite acute intracranial process.   05-17-16: TEE: - Left ventricle: Inferior wall hypokinesis The  cavity size was normal. Systolic function was normal. The estimated ejection fraction was in the range of 50% to 55%. Wall motion was normal; there were no regional wall motion abnormalities. Left ventricular diastolic function parameters were normal. - Mitral valve: There was mild regurgitation. - Atrial septum: No defect or patent foramen ovale was identified.  05-28-16: chest x-ray:  mild pulmonary venous congestion/chf  08-08-16: chest x-ray: Cardiac enlargement with pulmonary vascular congestion. No edema or consolidation. Linear atelectasis in the mid lungs.  08-15-16: ct of abdomen and pelvis: 1. Diffuse soft tissue phlegmon at the right hip. Remodeling of the anterior right femur and acetabulum, and at the posterior sacrum and coccyx, compatible with chronic osteomyelitis, due to underlying large sacral decubitus ulcerations. Mild phlegmon at the left hip. Vague fluid tracks along the musculature of the proximal right thigh, without focal abscess. Presacral stranding noted. 2. Trace bilateral pleural effusions. Partial consolidation of the left lower lobe, which could reflect pneumonia. 3. Dilatation of the common bile duct to 8-9 mm in diameter, and distention of the pancreatic duct to 5 mm, raising concern for distal obstruction. MRCP or ERCP could be considered for further evaluation, as deemed clinically appropriate. 4. Cholelithiasis. 5. Cardiomegaly.  Scattered coronary artery calcifications noted. 6. 6 mm nonspecific hypodensity within the left hepatic lobe. 7. Scattered diverticulosis along the cecum, and ascending, transverse, descending and proximal sigmoid colon. Right upper quadrant colostomy is unremarkable in appearance. No evidence of diverticulitis. 8. Scattered aortic atherosclerosis. Calcification at the proximal renal arteries bilaterally.  08-16-16: mri of sacrum and right hip: 1. Chronic destruction and remodeling of the lower sacrum and coccyx consistent with chronic  osteomyelitis. There is marrow edema and enhancement posteriorly in both iliac bones, suspicious for osteomyelitis. 2. Extensive heterotopic ossification and synovitis surrounding the right hip with large peripherally enhancing fluid collection in the right hip adductor musculature, worrisome for soft tissue abscess. No specific evidence of active osteomyelitis at the right hip. 3. Multifocal decubitus ulceration in the pelvis with associated generalized soft tissue inflammatory changes.   08-19-16; right thigh aspiration: 1. Technically successful CT-guided aspiration of right medial thigh fluid collection, returning scant blood, sent for Gram stain and culture.  08-22-16; mri of abdomen with mrcp: 1. Moderate to markedly motion degraded exam. 2. Mild common duct dilatation, without obstructive stone or mass. Especially given the normal bilirubin level on 08/14/2016, favored to be within normal variation. 3. Anasarca.    LABS REVIEWED:   04-08-16: wbc 9.7; hgb 8.9; hct 26.5; plt 330; glucose 122; bun 11; creat 0.21; k+ 4.1; na++ 128; liver normal albumin 2.2  04-12-16: wbc 15.0; hgb 8.8; hct 26.6; mcv 94.0; plt 386; glucose 125; bun 11; creat 0.21; k+ 4.0; na++ 132; liver normal albumin 2.0; chol 77; ldl 35; trig 81; hdl 26; iron 10; tibc 23; ferritin 471; depakote 15.4; hgb a1c 5.7; abs neut: 8400; abs mono 3750; abs baso 0  04-21-16: picc line tip culture: MRSA: vancomycin 05-10-16: wbc 13.0; hgb 8.1; hct 26.4; mcv 93.6; plt 555 05-16-16: wbc 16.1; hgb 10.7; hct 37.8; mcv 99.7; plt 478; glucose 128; bun 58; creat 0.58; k+ 4.8; na++ 151; ast 46; albumin 1.9; depakote 16; c-diff: neg; urine culture 20,000 e-coli; blood culture: enterococcus faecalis: ampicillin  05-17-16: wbc 16.1; hgb 7.1; hct 25.1; mcv 98.8; plt 382; glucose 92; bun 39; creat<0.3; k+ 3.3; na++ 153; mag 2.0 phos 2.7; hgb a1c 5.1 05-18-16: glucose 88; bun 15; creat <0.3; k+ 3.6; na++ 141 05-19-16: wbc 7.7; hgb 7.1; hct 24.5; mcv 95.0; plt  362; glucose 96; bun 10; creat <0.3; k+ 3.5; na++ 144  05-21-16: wbc 6.5; hgb 8.1; hct 28.0; mcv 92.1; plt 364; glucose 108; bun 8; creat 0.38; k+ 3.2; na++ 142  05-24-16: wbc 9.1; hgb 8.4; hct 27.6; mcv 91.1; plt 475; glucose 119; bun 11; creat 0.31; k+ 4.8;  na++ 139;  07-16-16: wbc 16.5; hgb 7.7; hct 26.3; mcv 97.7; plt 601; glucose 137; bun 61.9; creat 0.40; k+ 6.9; na++ 133; liver normal albumin 2.3  07-24-16: wbc 11.0; hgb 7.5; hct 25.4; mcv 95.5; plt 404;  glucose 113; bun 22.1; creat 0.17; k+ 5.6; na++ 131 mag 1.5  08-02-16; wbc 14.5; hgb 8.6; hct 29.3; mcv 103.2; plt 495; glucose 168; bun 32.0; creat 0.57; k+ 5.5; na++ 138; alk phos 154; albumin 2.1  08-08-16: wbc 14.6; hgb 10.0; hct 35.0; mcv 103.6; plt 304; glucose 90; bun 34; creat 0.35; k+ 4.7; na++ 143; liver normal albumin 1.5; ammonia 26; blood culture: no growth; urine culture: e-coli 08-11-16: wbc 9.6; hgb 10.5; hct 35.7; mcv 100.0; plt 274; glucose 114; bun 19; creat 0.34; k+ 5.5; na++ 142 08-14-16: wbc 9.9; hgb 9.2; hct 31.9; mcv 100.9; plt 303; glucose 131; bun 19; creat 0.36; k+ 4.1; na++ 144; liver; albumin 1.4  08-19-16: wbc 9.7; hgb 8.8; hct 29.3; mcv 97; plt 496; glucose 97; bun 18; creat <0.30; k+ 4.7; na++ 135   Review of Systems  Unable to perform ROS: Other    Physical Exam  Constitutional: No distress.  Frail   Neck: Neck supple. No JVD present. No thyromegaly present.  Cardiovascular: no pulse    Respiratory: no respirations   GI: Soft. Bowel sounds are normal.  Diverting colostomy   Genitourinary:  Genitourinary Comments: foley  Musculoskeletal: She exhibits no edema.  Right lower extremity contracted Is status post left aka   Lymphadenopathy:    She has no cervical adenopathy.  Neurological: non-responsive  Skin: Skin is warm and dry. She is not diaphoretic.  Sacral stage IV: 10 x 33 x 0.2 cm Mid back: 0.8 x 0.8 x 0.2 cm    ASSESSMENT/ PLAN:  1. Cardiopulmonary arrest: sent to the ED    Time spent  with patient 45   minutes >50% time spent counseling; reviewing medical record; tests; labs; and developing future plan of care   MD is aware of resident's narcotic use and is in agreement with current plan of care. We will attempt to wean resident as apropriate   Ok Edwards NP [facility]  Contact [phone] Monday through Friday 8am- 5pm  After hours call [phone]

## 2016-09-16 DEATH — deceased

## 2017-09-23 IMAGING — CR DG CHEST 1V PORT
1 series · 1 of 1 positions shown · non-contrast
Comparison: None.

CLINICAL DATA: 54-year-old presenting with acute mental status
changes. Personal history of stroke in seizures. Current history of
hypertension and sacral decubitus ulcer.

EXAM:
PORTABLE CHEST 1 VIEW

[AP]
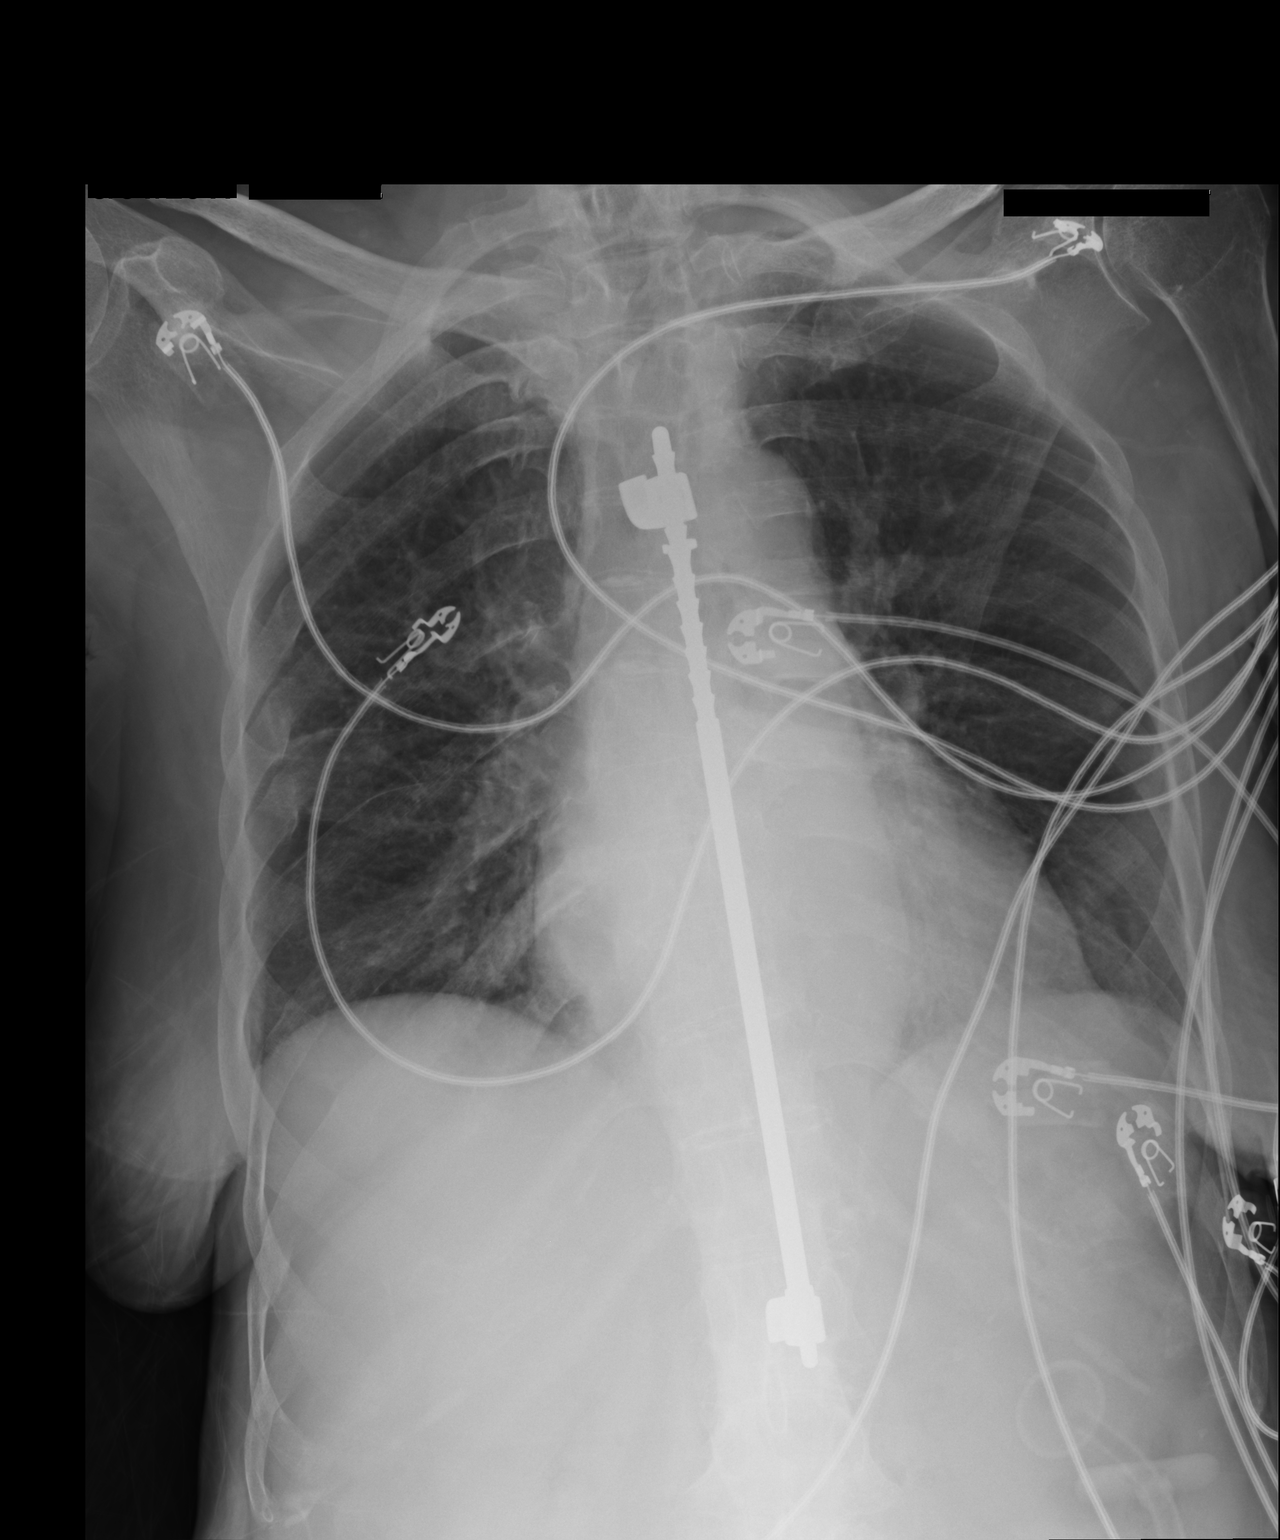

[1 of 1 positions shown; findings below may reference images not displayed]

FINDINGS: Cardiac silhouette upper normal in size for AP portable technique.
Lungs clear. Pulmonary vascularity normal. No visible pleural
effusions. Old healed right rib fractures. Harrington rod traverses
the thoracic and upper lumbar spine.
IMPRESSION: No acute cardiopulmonary disease.

## 2017-09-23 IMAGING — CR DG CHEST 1V PORT
1 series · 1 of 1 positions shown · non-contrast
Comparison: Chest radiograph May 16, 2016 at 6333 hours

CLINICAL DATA: LEFT-sided central line placement.

EXAM:
PORTABLE CHEST 1 VIEW

[AP]
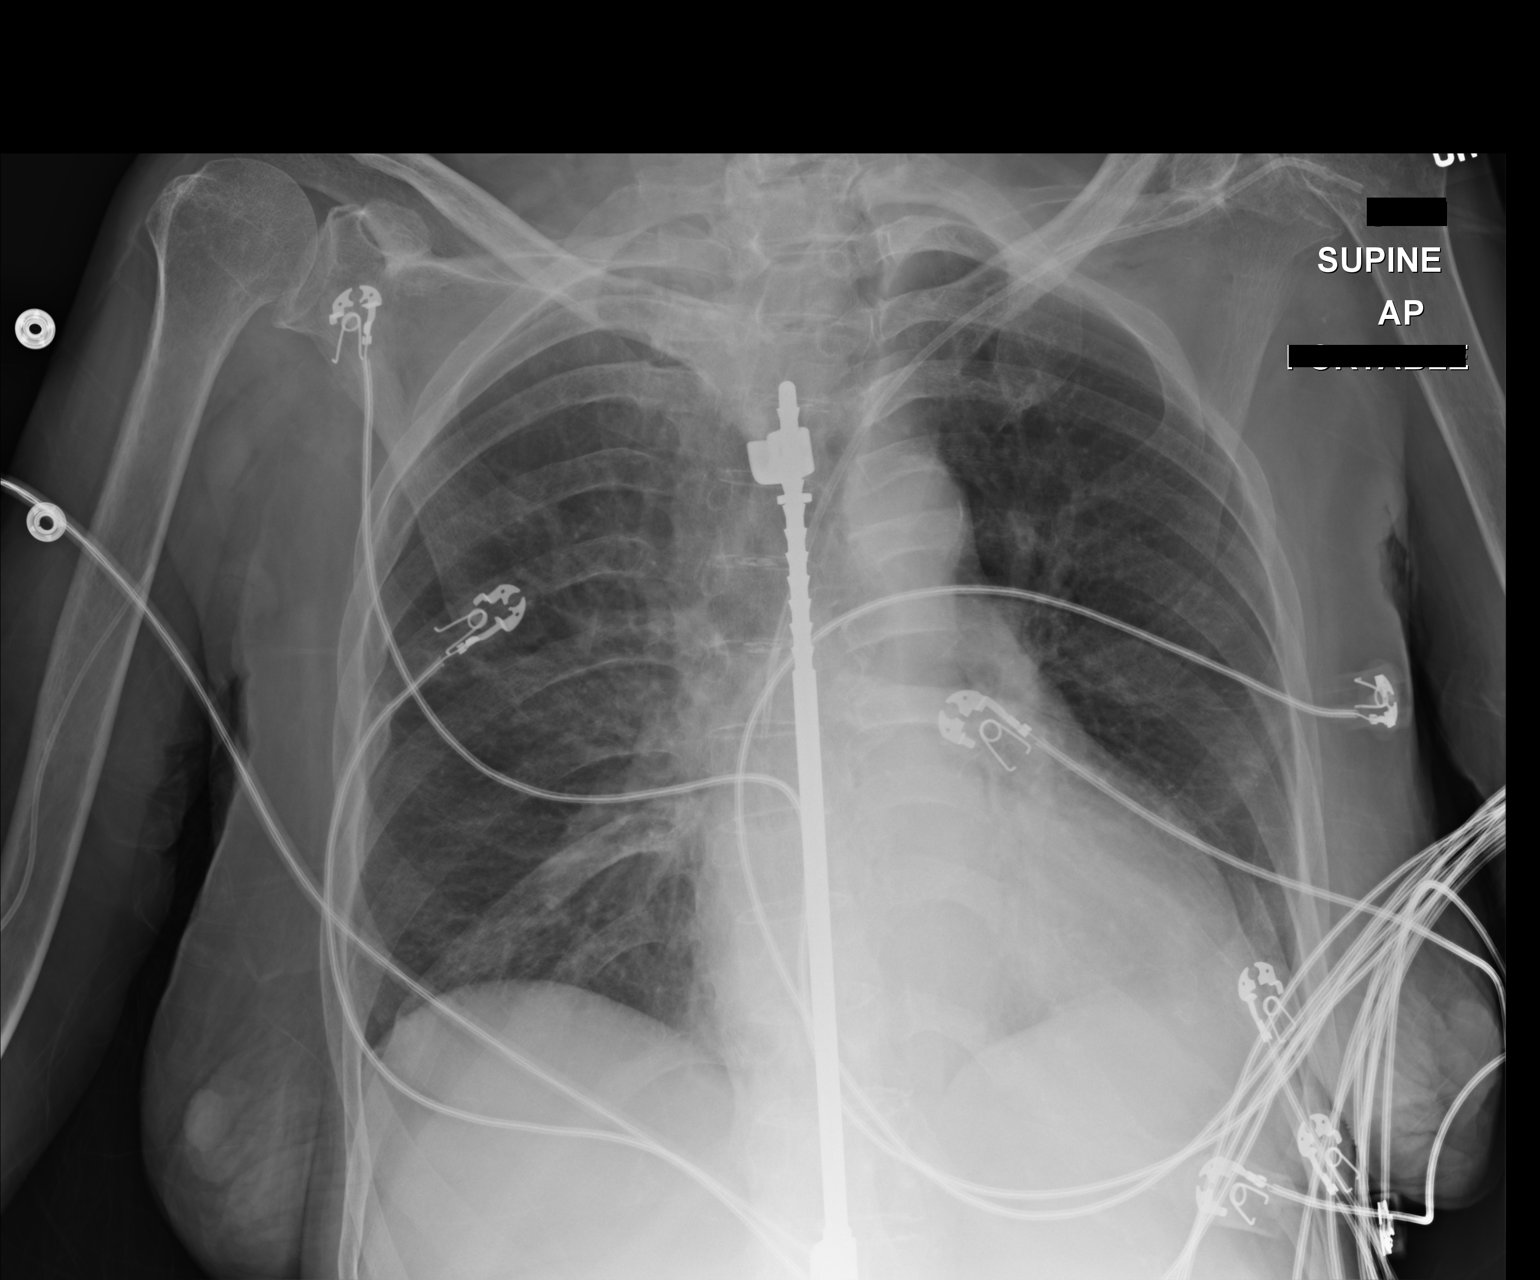

[1 of 1 positions shown; findings below may reference images not displayed]

FINDINGS: Interval placement of LEFT subclavian central venous catheter with
distal tip projecting in mid superior vena caval. No pneumothorax.

Cardiac silhouette is mildly enlarged, mediastinal silhouette is
nonsuspicious, mildly calcified aortic arch. Patchy bibasilar
airspace opacities. No pleural effusion or focal consolidation. Old
RIGHT rib fractures. Single thoracolumbar Harrington rods. Coarse
symmetric calcifications in the neck are likely vascular.
IMPRESSION: New LEFT subclavian central venous catheter with distal tip
projecting in mid superior vena cava. No pneumothorax.

Mild cardiomegaly.  Bibasilar atelectasis, less likely pneumonia.

## 2017-12-23 IMAGING — MR MR SACRUM / SI JOINTS WO/W CM
7 of 20 series · 20 of 48 positions shown · IV contrast (multihance)
Comparison: CT 08/15/2016.

CLINICAL DATA: 55-year-old with fever, right sacral decubitus ulcer
and extensive chronic inflammatory changes around the right hip on
CT. Previous left above the knee amputation.

EXAM:
MRI OF THE RIGHT HIP WITHOUT AND WITH CONTRAST
MR SACRUM AND SACROILIAC JOINTS WITHOUT AND WITH CONTRAST
TECHNIQUE: Multiplanar, multisequence MR imaging was performed both before and
after administration of intravenous contrast.
CONTRAST:  10 ml MultiHance.

[Series 5: T1 · coronal · 3.5mm · 0.70mm/px · 3 of 30 slices shown (1 of 4)]
[im 1/30]
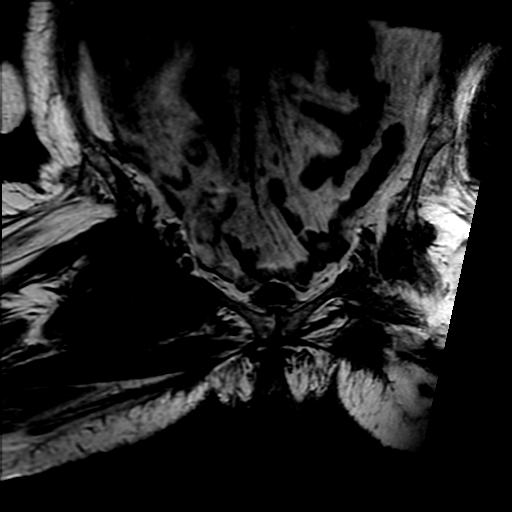
[im 15/30]
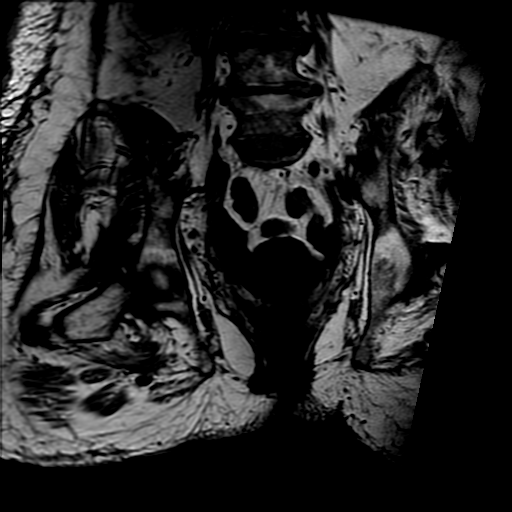
[im 30/30]
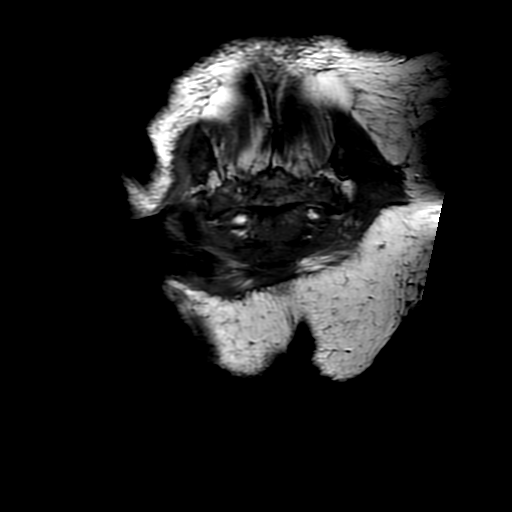

[Series 10: T1 · coronal · 5.0mm · 0.86mm/px · 3 of 30 slices shown (2 of 4)]
[im 1/30]
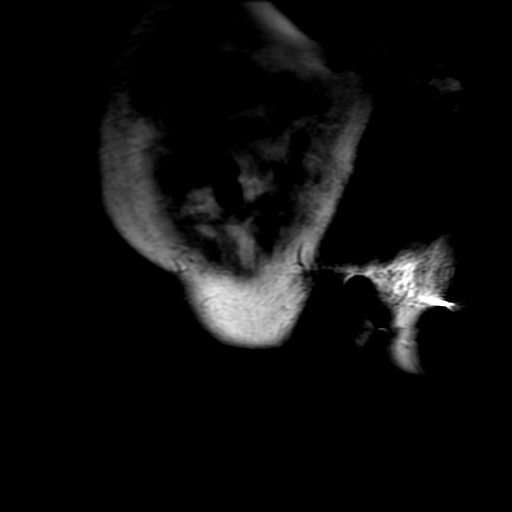
[im 15/30]
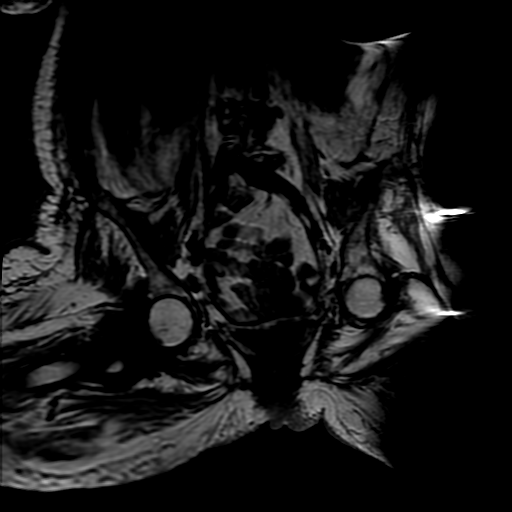
[im 30/30]
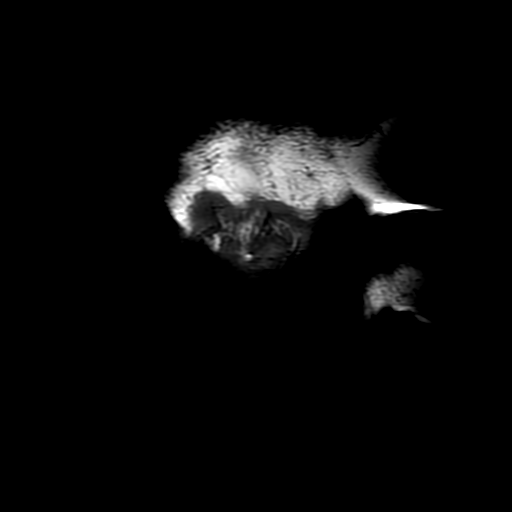

[Series 11: STIR · coronal · 5.0mm · 0.86mm/px · 2 of 30 slices shown]
[im 1/30]
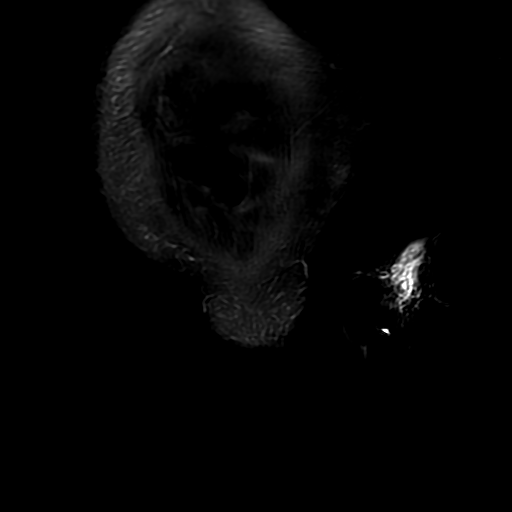
[im 30/30]
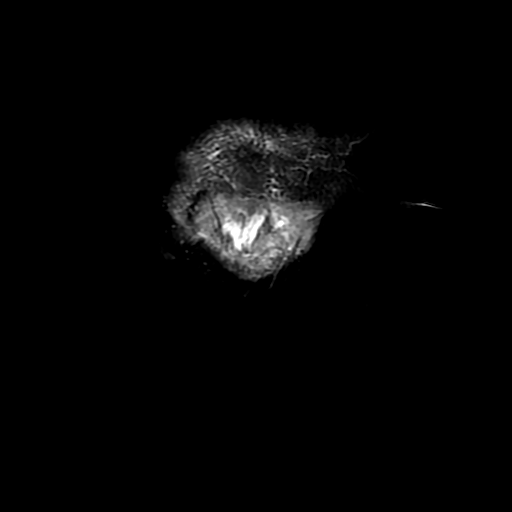

[Series 12: T2 fat-sat · axial · 4.0mm · 0.82mm/px · z∈[-150,+61]mm · 3 of 45 slices shown]
[im 1/45]
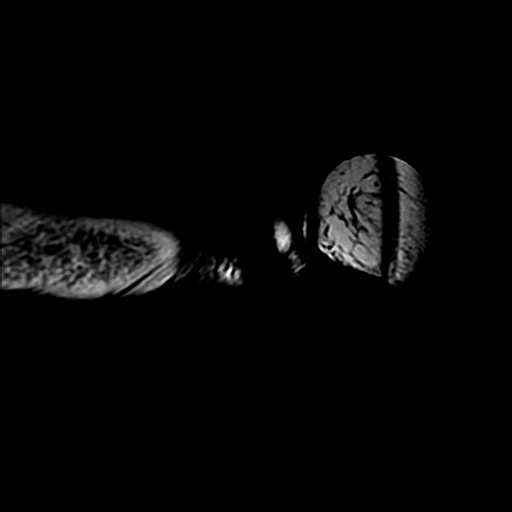
[im 23/45]
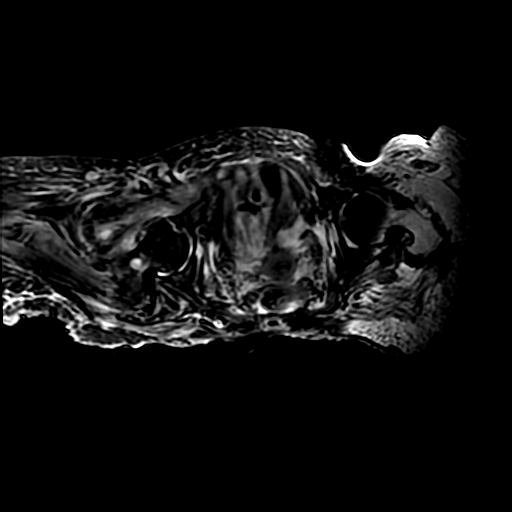
[im 45/45]
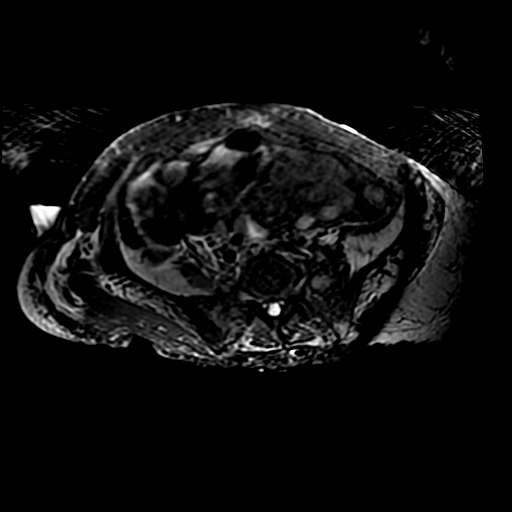

[Series 16: T1 · axial · 4.0mm · 1.72mm/px · z∈[-151,+61]mm · 3 of 45 slices shown (3 of 4)]
[im 1/45]
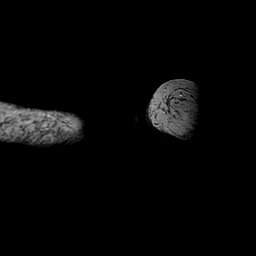
[im 23/45]
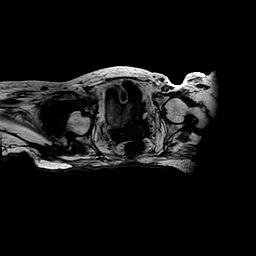
[im 45/45]
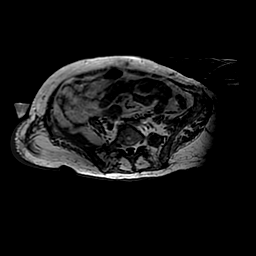

[Series 17: PD fat-sat · axial · 5.0mm · 0.39mm/px · z∈[-149,-4]mm · 3 of 35 slices shown]
[im 1/35]
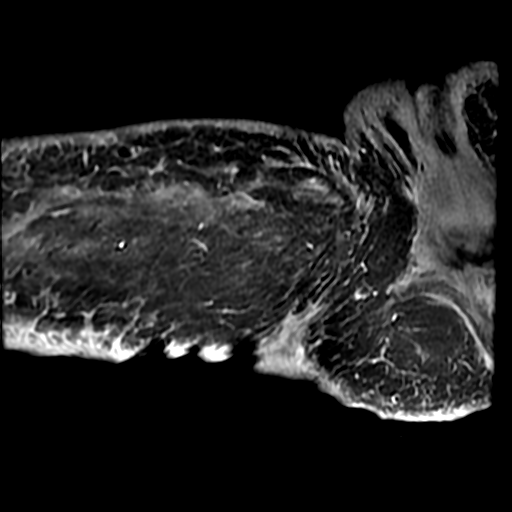
[im 18/35]
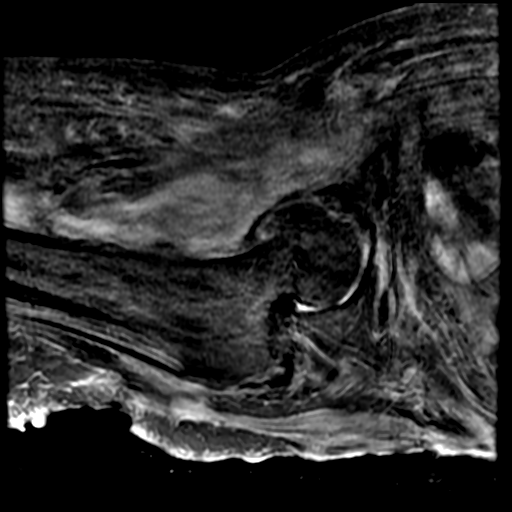
[im 35/35]
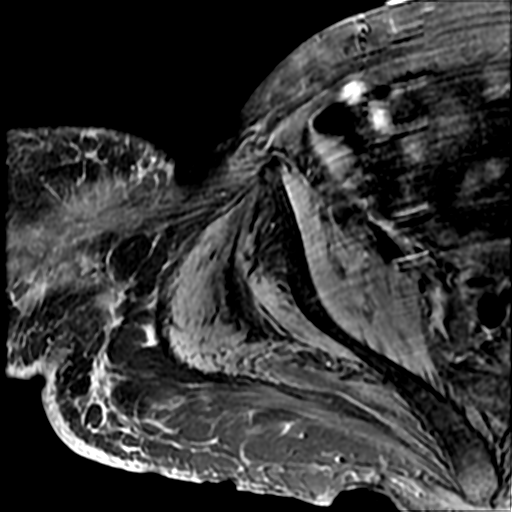

[Series 18: T1 · sagittal · 4.0mm · 0.66mm/px · 3 of 36 slices shown (4 of 4)]
[im 1/36]
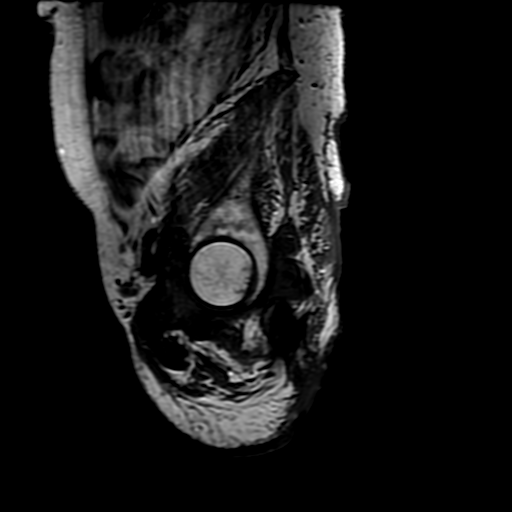
[im 18/36]
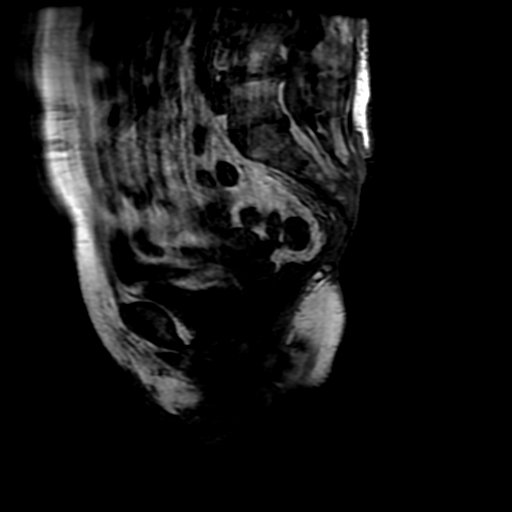
[im 36/36]
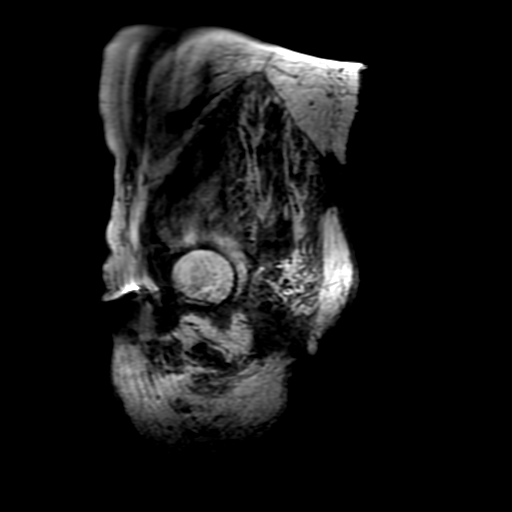

[20 of 48 positions shown; findings below may reference images not displayed]

FINDINGS: Bones/Joint/Cartilage : As correlated with CT, there is diffuse
destruction, remodeling and sclerosis of the lower sacrum and
coccyx, most consistent with chronic osteomyelitis. The sacroiliac
joints and symphysis pubis are intact. Both hips are located. There
is extensive heterotopic ossification surrounding the right hip
joint. There is an associated complex right hip joint effusion with
a large peripherally enhancing fluid collection within the right
adductor musculature. This fluid collection is best seen on the
postcontrast images (series 37 through 42). It measures up to 3.9 x
12.7 x 3.2 cm. No other focal fluid collections are seen. There is
some marrow edema and enhancement posteriorly in both iliac bones.
No evidence of proximal femoral osteomyelitis.

Ligaments

Not relevant for exam/indication.

Muscles and Tendons
There is asymmetric T2 hyperintensity and enhancement throughout the
right pelvic and proximal thigh musculature. As above, there is a
large fluid collection within the right hip adductor musculature.
The iliopsoas and hamstring tendons appear grossly intact.

Soft tissues
There is decubitus soft tissue ulceration posteriorly over the
sacrum and right hip joint. There is diffuse edema and enhancement
throughout the adjacent soft tissues, but no other focal fluid
collections. Foley catheter and a small amount of free pelvic fluid
noted.
IMPRESSION: 1. Chronic destruction and remodeling of the lower sacrum and coccyx
consistent with chronic osteomyelitis. There is marrow edema and
enhancement posteriorly in both iliac bones, suspicious for
osteomyelitis.
2. Extensive heterotopic ossification and synovitis surrounding the
right hip with large peripherally enhancing fluid collection in the
right hip adductor musculature, worrisome for soft tissue abscess.
No specific evidence of active osteomyelitis at the right hip.
3. Multifocal decubitus ulceration in the pelvis with associated
generalized soft tissue inflammatory changes.
